# Patient Record
Sex: Female | Born: 1970 | Race: Black or African American | Hispanic: No | Marital: Married | Smoking: Never smoker
Health system: Southern US, Community
[De-identification: ages and names within clinical notes are randomized; demographics above are authoritative.]

## PROBLEM LIST (undated history)

## (undated) ENCOUNTER — Ambulatory Visit: Admission: EM | Payer: BC Managed Care – PPO | Source: Home / Self Care

## (undated) DIAGNOSIS — N915 Oligomenorrhea, unspecified: Secondary | ICD-10-CM

## (undated) DIAGNOSIS — O1490 Unspecified pre-eclampsia, unspecified trimester: Secondary | ICD-10-CM

## (undated) DIAGNOSIS — F53 Postpartum depression: Secondary | ICD-10-CM

## (undated) DIAGNOSIS — L508 Other urticaria: Secondary | ICD-10-CM

## (undated) DIAGNOSIS — R112 Nausea with vomiting, unspecified: Secondary | ICD-10-CM

## (undated) DIAGNOSIS — K591 Functional diarrhea: Secondary | ICD-10-CM

## (undated) DIAGNOSIS — D509 Iron deficiency anemia, unspecified: Secondary | ICD-10-CM

## (undated) DIAGNOSIS — G2581 Restless legs syndrome: Secondary | ICD-10-CM

## (undated) DIAGNOSIS — K219 Gastro-esophageal reflux disease without esophagitis: Secondary | ICD-10-CM

## (undated) DIAGNOSIS — E042 Nontoxic multinodular goiter: Secondary | ICD-10-CM

## (undated) DIAGNOSIS — T7840XA Allergy, unspecified, initial encounter: Secondary | ICD-10-CM

## (undated) DIAGNOSIS — E119 Type 2 diabetes mellitus without complications: Secondary | ICD-10-CM

## (undated) DIAGNOSIS — Z8619 Personal history of other infectious and parasitic diseases: Secondary | ICD-10-CM

## (undated) DIAGNOSIS — I10 Essential (primary) hypertension: Secondary | ICD-10-CM

## (undated) DIAGNOSIS — F419 Anxiety disorder, unspecified: Secondary | ICD-10-CM

## (undated) DIAGNOSIS — F908 Attention-deficit hyperactivity disorder, other type: Secondary | ICD-10-CM

## (undated) DIAGNOSIS — Z9889 Other specified postprocedural states: Secondary | ICD-10-CM

## (undated) DIAGNOSIS — R011 Cardiac murmur, unspecified: Secondary | ICD-10-CM

## (undated) DIAGNOSIS — O99345 Other mental disorders complicating the puerperium: Secondary | ICD-10-CM

## (undated) DIAGNOSIS — M129 Arthropathy, unspecified: Secondary | ICD-10-CM

## (undated) HISTORY — DX: Type 2 diabetes mellitus without complications: E11.9

## (undated) HISTORY — DX: Oligomenorrhea, unspecified: N91.5

## (undated) HISTORY — DX: Unspecified pre-eclampsia, unspecified trimester: O14.90

## (undated) HISTORY — DX: Morbid (severe) obesity due to excess calories: E66.01

## (undated) HISTORY — PX: FRACTURE SURGERY: SHX138

## (undated) HISTORY — DX: Arthropathy, unspecified: M12.9

## (undated) HISTORY — DX: Gastro-esophageal reflux disease without esophagitis: K21.9

## (undated) HISTORY — DX: Personal history of other infectious and parasitic diseases: Z86.19

## (undated) HISTORY — DX: Iron deficiency anemia, unspecified: D50.9

## (undated) HISTORY — DX: Essential (primary) hypertension: I10

## (undated) HISTORY — DX: Postpartum depression: F53.0

## (undated) HISTORY — DX: Other mental disorders complicating the puerperium: O99.345

## (undated) HISTORY — DX: Allergy, unspecified, initial encounter: T78.40XA

## (undated) HISTORY — DX: Nontoxic multinodular goiter: E04.2

---

## 1997-04-06 ENCOUNTER — Encounter: Payer: Self-pay | Admitting: Family Medicine

## 1997-04-06 LAB — CONVERTED CEMR LAB
CO2: 28 meq/L
Chloride: 105 meq/L
Cholesterol: 247 mg/dL
Creatinine, Ser: 0.9 mg/dL
Potassium: 3.9 meq/L
Sodium: 143 meq/L

## 1999-07-18 ENCOUNTER — Encounter: Payer: Self-pay | Admitting: Family Medicine

## 1999-07-18 ENCOUNTER — Other Ambulatory Visit: Admission: RE | Admit: 1999-07-18 | Discharge: 1999-07-18 | Payer: Self-pay | Admitting: Gynecology

## 1999-07-18 LAB — CONVERTED CEMR LAB: Prolactin: 5.9 ng/mL

## 2001-07-16 DIAGNOSIS — Z9884 Bariatric surgery status: Secondary | ICD-10-CM | POA: Insufficient documentation

## 2001-07-16 HISTORY — PX: GASTRIC BYPASS: SHX52

## 2004-07-16 HISTORY — PX: OTHER SURGICAL HISTORY: SHX169

## 2004-09-20 ENCOUNTER — Ambulatory Visit: Payer: Self-pay

## 2005-02-01 ENCOUNTER — Ambulatory Visit: Payer: Self-pay | Admitting: Unknown Physician Specialty

## 2005-03-16 ENCOUNTER — Ambulatory Visit: Payer: Self-pay | Admitting: Unknown Physician Specialty

## 2006-05-20 ENCOUNTER — Observation Stay: Payer: Self-pay

## 2006-05-31 ENCOUNTER — Inpatient Hospital Stay: Payer: Self-pay | Admitting: Obstetrics and Gynecology

## 2007-08-08 ENCOUNTER — Ambulatory Visit: Payer: Self-pay | Admitting: Oncology

## 2007-08-15 LAB — CBC WITH DIFFERENTIAL (CANCER CENTER ONLY)
BASO%: 0.4 % (ref 0.0–2.0)
LYMPH%: 17.7 % (ref 14.0–48.0)
MCH: 20.9 pg — ABNORMAL LOW (ref 26.0–34.0)
MCHC: 32 g/dL (ref 32.0–36.0)
MCV: 65 fL — ABNORMAL LOW (ref 81–101)
MONO%: 6.3 % (ref 0.0–13.0)
Platelets: 453 10*3/uL — ABNORMAL HIGH (ref 145–400)
RDW: 17 % — ABNORMAL HIGH (ref 10.5–14.6)

## 2007-08-20 LAB — RETICULOCYTES (CHCC)
ABS Retic: 42.1 10*3/uL (ref 19.0–186.0)
RBC.: 4.68 MIL/uL (ref 3.87–5.11)
Retic Ct Pct: 0.9 % (ref 0.4–3.1)

## 2007-08-20 LAB — HEMOGLOBINOPATHY EVALUATION: Hgb S Quant: 0 % (ref 0.0–0.0)

## 2007-08-20 LAB — FERRITIN: Ferritin: 6 ng/mL — ABNORMAL LOW (ref 10–291)

## 2007-08-20 LAB — TSH: TSH: 1.451 u[IU]/mL (ref 0.350–5.500)

## 2007-09-12 LAB — CBC WITH DIFFERENTIAL (CANCER CENTER ONLY)
BASO#: 0.1 10*3/uL (ref 0.0–0.2)
Eosinophils Absolute: 0.2 10*3/uL (ref 0.0–0.5)
LYMPH%: 17.1 % (ref 14.0–48.0)
MCH: 20.8 pg — ABNORMAL LOW (ref 26.0–34.0)
MCV: 65 fL — ABNORMAL LOW (ref 81–101)
MONO%: 7 % (ref 0.0–13.0)
Platelets: 438 10*3/uL — ABNORMAL HIGH (ref 145–400)
RBC: 4.5 10*6/uL (ref 3.70–5.32)

## 2007-10-15 ENCOUNTER — Ambulatory Visit: Payer: Self-pay | Admitting: Oncology

## 2007-10-17 LAB — CBC WITH DIFFERENTIAL (CANCER CENTER ONLY)
BASO#: 0.1 10*3/uL (ref 0.0–0.2)
Eosinophils Absolute: 0.2 10*3/uL (ref 0.0–0.5)
HCT: 36.8 % (ref 34.8–46.6)
HGB: 12.1 g/dL (ref 11.6–15.9)
MCH: 24.2 pg — ABNORMAL LOW (ref 26.0–34.0)
MCV: 73 fL — ABNORMAL LOW (ref 81–101)
MONO%: 5.9 % (ref 0.0–13.0)
NEUT#: 7.2 10*3/uL — ABNORMAL HIGH (ref 1.5–6.5)
RBC: 5.01 10*6/uL (ref 3.70–5.32)

## 2007-10-17 LAB — IRON AND TIBC
Iron: 75 ug/dL (ref 42–145)
TIBC: 353 ug/dL (ref 250–470)
UIBC: 278 ug/dL

## 2008-01-19 ENCOUNTER — Ambulatory Visit: Payer: Self-pay | Admitting: Oncology

## 2008-10-16 ENCOUNTER — Observation Stay: Payer: Self-pay

## 2008-11-01 ENCOUNTER — Observation Stay: Payer: Self-pay

## 2008-11-02 ENCOUNTER — Ambulatory Visit: Payer: Self-pay

## 2008-12-15 ENCOUNTER — Observation Stay: Payer: Self-pay

## 2008-12-23 ENCOUNTER — Observation Stay: Payer: Self-pay | Admitting: Obstetrics and Gynecology

## 2008-12-29 ENCOUNTER — Inpatient Hospital Stay: Payer: Self-pay

## 2009-02-15 ENCOUNTER — Ambulatory Visit: Payer: Self-pay | Admitting: Oncology

## 2009-09-26 ENCOUNTER — Encounter: Payer: Self-pay | Admitting: Family Medicine

## 2009-09-26 LAB — CONVERTED CEMR LAB
ALT: 13 units/L
AST: 15 units/L
Albumin: 4 g/dL
CO2: 21 meq/L
Calcium: 9.1 mg/dL
Chloride: 104 meq/L
HDL: 58 mg/dL
LDL Cholesterol: 119 mg/dL
Platelets: 442 10*3/uL
Potassium: 4.2 meq/L
TSH: 1.3 microintl units/mL

## 2009-09-27 ENCOUNTER — Encounter: Payer: Self-pay | Admitting: Family Medicine

## 2010-06-22 ENCOUNTER — Encounter: Payer: Self-pay | Admitting: Family Medicine

## 2010-06-22 ENCOUNTER — Ambulatory Visit: Payer: Self-pay | Admitting: Family Medicine

## 2010-06-22 ENCOUNTER — Telehealth: Payer: Self-pay | Admitting: Family Medicine

## 2010-06-22 DIAGNOSIS — I10 Essential (primary) hypertension: Secondary | ICD-10-CM | POA: Insufficient documentation

## 2010-06-23 ENCOUNTER — Telehealth (INDEPENDENT_AMBULATORY_CARE_PROVIDER_SITE_OTHER): Payer: Self-pay

## 2010-06-23 LAB — CONVERTED CEMR LAB
ALT: 9 units/L (ref 0–35)
Basophils Absolute: 0 10*3/uL (ref 0.0–0.1)
CO2: 26 meq/L (ref 19–32)
Calcium: 8.4 mg/dL (ref 8.4–10.5)
Chloride: 105 meq/L (ref 96–112)
Creatinine, Ser: 0.72 mg/dL (ref 0.40–1.20)
Eosinophils Relative: 2 % (ref 0–5)
Glucose, Bld: 104 mg/dL — ABNORMAL HIGH (ref 70–99)
HCT: 33.8 % — ABNORMAL LOW (ref 36.0–46.0)
Hemoglobin: 10.2 g/dL — ABNORMAL LOW (ref 12.0–15.0)
Lymphocytes Relative: 17 % (ref 12–46)
Lymphs Abs: 1.5 10*3/uL (ref 0.7–4.0)
Monocytes Absolute: 0.8 10*3/uL (ref 0.1–1.0)
Monocytes Relative: 9 % (ref 3–12)
Neutro Abs: 6.4 10*3/uL (ref 1.7–7.7)
RBC: 4.59 M/uL (ref 3.87–5.11)
RDW: 16.3 % — ABNORMAL HIGH (ref 11.5–15.5)
Saturation Ratios: 5 % — ABNORMAL LOW (ref 20–55)
TIBC: 420 ug/dL (ref 250–470)
TSH: 0.601 microintl units/mL (ref 0.350–4.500)
Total Bilirubin: 0.4 mg/dL (ref 0.3–1.2)
Total Protein: 7 g/dL (ref 6.0–8.3)
Vit D, 25-Hydroxy: 14 ng/mL — ABNORMAL LOW (ref 30–89)
Vitamin B-12: 464 pg/mL (ref 211–911)
WBC: 8.9 10*3/uL (ref 4.0–10.5)

## 2010-06-26 ENCOUNTER — Ambulatory Visit: Payer: Self-pay | Admitting: Family Medicine

## 2010-06-29 ENCOUNTER — Encounter: Payer: Self-pay | Admitting: Family Medicine

## 2010-07-03 ENCOUNTER — Ambulatory Visit: Payer: Self-pay | Admitting: Internal Medicine

## 2010-07-03 DIAGNOSIS — K219 Gastro-esophageal reflux disease without esophagitis: Secondary | ICD-10-CM

## 2010-07-03 DIAGNOSIS — D509 Iron deficiency anemia, unspecified: Secondary | ICD-10-CM

## 2010-07-03 DIAGNOSIS — E559 Vitamin D deficiency, unspecified: Secondary | ICD-10-CM

## 2010-07-03 DIAGNOSIS — J309 Allergic rhinitis, unspecified: Secondary | ICD-10-CM | POA: Insufficient documentation

## 2010-07-03 DIAGNOSIS — Z9884 Bariatric surgery status: Secondary | ICD-10-CM | POA: Insufficient documentation

## 2010-07-14 ENCOUNTER — Ambulatory Visit
Admission: RE | Admit: 2010-07-14 | Discharge: 2010-07-14 | Payer: Self-pay | Source: Home / Self Care | Attending: Family Medicine | Admitting: Family Medicine

## 2010-07-14 DIAGNOSIS — M545 Low back pain, unspecified: Secondary | ICD-10-CM | POA: Insufficient documentation

## 2010-07-16 ENCOUNTER — Encounter: Payer: Self-pay | Admitting: Family Medicine

## 2010-07-18 ENCOUNTER — Encounter
Admission: RE | Admit: 2010-07-18 | Discharge: 2010-07-18 | Payer: Self-pay | Source: Home / Self Care | Attending: Family Medicine | Admitting: Family Medicine

## 2010-07-19 ENCOUNTER — Encounter: Payer: Self-pay | Admitting: Family Medicine

## 2010-07-20 ENCOUNTER — Telehealth: Payer: Self-pay | Admitting: Family Medicine

## 2010-07-20 ENCOUNTER — Encounter
Admission: RE | Admit: 2010-07-20 | Discharge: 2010-07-20 | Payer: Self-pay | Source: Home / Self Care | Attending: Family Medicine | Admitting: Family Medicine

## 2010-07-20 LAB — HM MAMMOGRAPHY

## 2010-07-21 ENCOUNTER — Ambulatory Visit
Admission: RE | Admit: 2010-07-21 | Discharge: 2010-07-21 | Payer: Self-pay | Source: Home / Self Care | Attending: Family Medicine | Admitting: Family Medicine

## 2010-07-21 DIAGNOSIS — K1370 Unspecified lesions of oral mucosa: Secondary | ICD-10-CM | POA: Insufficient documentation

## 2010-07-21 DIAGNOSIS — N63 Unspecified lump in unspecified breast: Secondary | ICD-10-CM | POA: Insufficient documentation

## 2010-08-09 ENCOUNTER — Encounter: Payer: Self-pay | Admitting: Family Medicine

## 2010-08-09 ENCOUNTER — Observation Stay: Payer: Self-pay | Admitting: Surgery

## 2010-08-15 NOTE — Assessment & Plan Note (Signed)
Summary: COLD, CONGESTION/EVM   Vital Signs:  Patient Profile:   40 Years Old Female CC:      Cold & URI symptoms Height:     67.5 inches Weight:      206 pounds BMI:     31.90 O2 Sat:      100 % O2 treatment:    Room Air Temp:     97.2 degrees F oral Pulse rate:   80 / minute Pulse rhythm:   regular Resp:     18 per minute BP sitting:   139 / 93  (right arm)  Pt. in pain?   yes    Location:   head    Intensity:   1    Type:       aching  Vitals Entered By: Levonne Spiller EMT-P (June 22, 2010 11:40 AM)              Is Patient Diabetic? No  Does patient need assistance? Functional Status Self care Ambulation Normal      Current Allergies: No known allergies History of Present Illness Chief Complaint: Cold & URI symptoms History of Present Illness: The patient is presenting today for an evaluation of barking cough and sinus drainage and congestion and malaise and fatigue.  Pt syas that she has been having symptoms for the last 2 weeks and not getting better.  Pt coughing up yellow green sputum and having a constant runny nose and headache.  She says that she has been taking Mucinex D and has taken at least 18 tablets and still having symptoms.  She says that she had a gastric bypass and is concerned about anemia.  She has a history of iron deficiency anemia.  She has a desire to check her labs.  She wants to have a CBC and iron studies checked and  a vitamin D level. She says that she is concerned about swelling in the right leg and calf.  She is using a prescription implanted birth control method in her left arm.  She says that she will be established with the [facility] physicians in the next several weeks.  She says that before the gastric bypass she had a history of diabetes, hypertension and many other co-morbidities.    REVIEW OF SYSTEMS Constitutional Symptoms       Complains of weight loss.     Denies fever, chills, night sweats, weight gain, and fatigue.  Eyes         Complains of eye drainage.      Denies change in vision, eye pain, glasses, contact lenses, and eye surgery.      Comments: Watery Eyes Ear/Nose/Throat/Mouth       Complains of frequent runny nose, sinus problems, and hoarseness.      Denies hearing loss/aids, change in hearing, ear pain, ear discharge, dizziness, frequent nose bleeds, and tooth pain or bleeding.  Respiratory       Complains of productive cough.      Denies dry cough, wheezing, shortness of breath, asthma, bronchitis, and emphysema/COPD.      Comments: Colored Sputum Cardiovascular       Denies murmurs, chest pain, and tires easily with exhertion.    Gastrointestinal       Denies stomach pain, nausea/vomiting, diarrhea, constipation, blood in bowel movements, and indigestion.      Comments: IBS Genitourniary       Denies painful urination, kidney stones, and loss of urinary control. Neurological  Complains of headaches.      Denies paralysis, seizures, and fainting/blackouts. Musculoskeletal       Denies muscle pain, joint pain, joint stiffness, decreased range of motion, redness, swelling, muscle weakness, and gout.  Skin       Denies bruising, unusual mles/lumps or sores, and hair/skin or nail changes.  Psych       Denies mood changes, temper/anger issues, anxiety/stress, speech problems, depression, and sleep problems.  Past History:  Past Medical History: PRE-GASTRIC BYPASS Morbid Obesity Hypertension Diabetes Mellitus Weight bearing arthropathy  POST GASTRIC BYPASS Allergic Rhinitis  Past Surgical History: Gastric Bypass - 2003  Family History: Father had a history of heart disease  Social History: Pt is currently in school studying psychology.  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  Never Smoked Alcohol use-no Drug use-no Regular exercise-no Smoking Status:  never Drug Use:  no Does Patient Exercise:  no Physical Exam General appearance: well  developed, well nourished, no acute distress Head: normocephalic, atraumatic Eyes: conjunctivae and lids normal Pupils: equal, round, reactive to light Ears: normal, no lesions or deformities Nasal: pale, boggy, swollen nasal turbinates Oral/Pharynx: tongue normal, posterior pharynx without erythema or exudate Neck: neck supple,  trachea midline, no masses Thyroid: no nodules, masses, tenderness, or enlargement Chest/Lungs: no rales, wheezes, or rhonchi bilateral, breath sounds equal without effort Heart: regular rate and  rhythm, no murmur Abdomen: soft, non-tender without obvious organomegaly Extremities: mild edema of the right leg, no edema seen in left leg Neurological: grossly intact and non-focal Skin: no obvious rashes or lesions MSE: oriented to time, place, and person Assessment Problems:   New Problems: UNSPECIFIED ESSENTIAL HYPERTENSION (ICD-401.9) ACUTE SINUSITIS, UNSPECIFIED (ICD-461.9) EDEMA LEG (ICD-782.3) ? of DEEP VENOUS THROMBOPHLEBITIS, LEG (ICD-453.40)   Patient Education: Patient and/or caregiver instructed in the following: rest, Tylenol prn, exercise, weight loss. The risks, benefits and possible side effects were clearly explained and discussed with the patient.  The patient verbalized clear understanding.  The patient was given instructions to return if symptoms don't improve, worsen or new changes develop.  If it is not during clinic hours and the patient cannot get back to this clinic then the patient was told to seek medical care at an available urgent care or emergency department.  The patient verbalized understanding.   Demonstrates willingness to comply.  Plan New Medications/Changes: DELSYM 30 MG/5ML LQCR (DEXTROMETHORPHAN POLISTIREX) take 1 teaspoon by mouth every 12 hours as needed for severe cough  #80 ML x 0, 06/22/2010, Clanford Johnson MD HYDROCHLOROTHIAZIDE 12.5 MG CAPS (HYDROCHLOROTHIAZIDE) take 1 by mouth daily for blood pressure  #30 x 1,  06/22/2010, Clanford Johnson MD FLUTICASONE PROPIONATE 50 MCG/ACT SUSP (FLUTICASONE PROPIONATE) take 2 sprays per nostril once daily  #1 x 0, 06/22/2010, Clanford Johnson MD AZITHROMYCIN 250 MG TABS (AZITHROMYCIN) take 2 tabs by mouth on day 1, then 1 tab by mouth daily for 4 days  #6 x 0, 06/22/2010, Clanford Johnson MD  Planning Comments:   Recommended that the patient have her blood pressure retested in 3 days.  We will have the patient to start taking blood pressure medications again.  Pt will be started on HCTZ.   The patient will have a medical evaluation in 2 weeks as scheduled at [facility].   Follow Up: Follow up in 2-3 days if no improvement, Follow up on an as needed basis, Follow up with Primary Physician  The patient and/or caregiver has been counseled thoroughly with regard  to medications prescribed including dosage, schedule, interactions, rationale for use, and possible side effects and they verbalize understanding.  Diagnoses and expected course of recovery discussed and will return if not improved as expected or if the condition worsens. Patient and/or caregiver verbalized understanding.  Prescriptions: DELSYM 30 MG/5ML LQCR (DEXTROMETHORPHAN POLISTIREX) take 1 teaspoon by mouth every 12 hours as needed for severe cough  #80 ML x 0   Entered and Authorized by:   Standley Dakins MD   Signed by:   Standley Dakins MD on 06/22/2010   Method used:   Electronically to        [facility] [address] (retail)       [address], [address]       [place], [place]  [address]       Ph: [phone]       Fax: [phone]   RxID:   [id number] HYDROCHLOROTHIAZIDE 12.5 MG CAPS (HYDROCHLOROTHIAZIDE) take 1 by mouth daily for blood pressure  #30 x 1   Entered and Authorized by:   Standley Dakins MD   Signed by:   Standley Dakins MD on 06/22/2010   Method used:   Electronically to        [facility] [address] (retail)       [address],  [address]       [place], [place]  [address]       Ph: [phone]       Fax: [phone]   RxID:   [id number] FLUTICASONE PROPIONATE 50 MCG/ACT SUSP (FLUTICASONE PROPIONATE) take 2 sprays per nostril once daily  #1 x 0   Entered and Authorized by:   Standley Dakins MD   Signed by:   Standley Dakins MD on 06/22/2010   Method used:   Electronically to        [facility] [address] (retail)       [address], [address]       [place], [place]  [address]       Ph: [phone]       Fax: [phone]   RxID:   [id number] AZITHROMYCIN 250 MG TABS (AZITHROMYCIN) take 2 tabs by mouth on day 1, then 1 tab by mouth daily for 4 days  #6 x 0   Entered and Authorized by:   Standley Dakins MD   Signed by:   Standley Dakins MD on 06/22/2010   Method used:   Electronically to        [facility] [address] (retail)       [address], [address]       [place], [place]  [address]       Ph: [phone]       Fax: [phone]   RxID:   [id number]   Patient Instructions: 1)  Take your antibiotic as prescribed until ALL of it is gone, but stop if you develop a rash or swelling and contact our office as soon as possible. 2)  Acute sinusitis symptoms for less than 10 days are not helped by antibiotics.Use warm moist compresses, and over the counter decongestants ( only as directed). Call if no improvement in 5-7 days, sooner if increasing pain, fever, or new symptoms. 3)  Check your Blood Pressure regularly. If it is above: 140/90  you should make an appointment. 4)  Please go to [facility] to have a doppler ultrasound done of your right leg to rule out blood clot.   5)  The patient was informed that there is no on-call provider or services available at this clinic during off-hours (when the clinic is closed).  If the patient developed a problem or concern that required immediate attention, the  patient was advised to go the the nearest available urgent care or emergency department for medical care.  The patient verbalized understanding.      I spent more than 70 minutes with the patient today reviewing her complicated medical history and preparing a treatment plan and arranging for her to go to [facility] for the venous doppler ultrasounds.   Rodney Langton, M.D., F.A.A.F.P.  June 22, 2010

## 2010-08-15 NOTE — Letter (Signed)
Summary: Generic Letter  [facility]  [address]   [place], [place] [address]   Phone: [phone]  Fax: [phone]    06/22/2010  Theresa Pratt [address] [place], [place]  [address]  TO WHOM IT MAY CONCERN:     The patient was seen in our clinic today for medical care.        Sincerely,   Standley Dakins MD

## 2010-08-15 NOTE — Progress Notes (Signed)
  Phone Note Outgoing Call   Call placed by: Dorna Leitz,  June 22, 2010 1:00 PM Call placed to: Patient Action Taken: Appt scheduled Summary of Call: Patient came in for an appointment to see Dr. Laural Benes.  The patient had right leg was swollen so Dr. Laural Benes wanted to schedule the patient for an Venouse doppler (ultrasound) at [facility].  The Dr. Laural Benes ordered the patient to have the venous dopple (ultrasound) today but she was unable to keep the appointment.  We scheduled for 06/23/10 @ 11:00a.m. but the patient will have to be there at 10:30a.m. in Admitting.  I called the patient back and she stated the appointment was better for her on this day. Initial call taken by: Dorna Leitz,  June 22, 2010 1:13 PM

## 2010-08-15 NOTE — Progress Notes (Signed)
  Phone Note Outgoing Call   Call placed by: Levonne Spiller EMT-P,  June 23, 2010 12:44 PM Reason for Call: Discuss lab or test results Summary of Call: Called Theresa Pratt. and left message to contact our office in ref. to lab results and Rx.  Follow-up for Phone Call        Phone call completed. Theresa Pratt was notified of current lab results and orders from Dr. Laural Benes. Also new Rx she needs to pick up and start. Follow-up by: Levonne Spiller EMT-P,  June 23, 2010 1:02 PM    New/Updated Medications: DRISDOL 16010 UNIT CAPS (ERGOCALCIFEROL) 1 cap biw ( Twice Per Week) For 8 weeks. FERROUS SULFATE 325 (65 FE) MG TABS (FERROUS SULFATE) 1 tab by mouth Daily Prescriptions: FERROUS SULFATE 325 (65 FE) MG TABS (FERROUS SULFATE) 1 tab by mouth Daily  #50 x 0   Entered by:   Levonne Spiller EMT-P   Authorized by:   Standley Dakins MD   Signed by:   Levonne Spiller EMT-P on 06/23/2010   Method used:   Electronically to        [facility] [address] ([facility]ail)       [address], [address]       [place], [place]  [address]       Ph: [phone]       Fax: [phone]   RxID:   [id number] DRISDOL 50000 UNIT CAPS (ERGOCALCIFEROL) 1 cap biw ( Twice Per Week) For 8 weeks.  #16 x 0   Entered by:   Levonne Spiller EMT-P   Authorized by:   Standley Dakins MD   Signed by:   Levonne Spiller EMT-P on 06/23/2010   Method used:   Electronically to        [facility] [address] ([facility]ail)       [address], [address]       [place], [place]  [address]       Ph: [phone]       Fax: [phone]   RxID:   [id number]

## 2010-08-15 NOTE — Letter (Signed)
Summary: Generic Letter  [facility]  [address]   [place], [place] [address]   Phone: [phone]  Fax: [phone]    06/22/2010  [facility] [address] [place], [place]  [address]  TO WHOM IT MAY CONCERN:  ORDER FOR VENOUS COLORFLOW DOPPLER OF RIGHT LEG TO RULE OUT DVT  DIAGNOSES:  782.3, 453.40  PLEASE CALL RESULTS TO [phone]  FAX RESULTS TO [phone]     Sincerely,   Standley Dakins MD

## 2010-08-16 HISTORY — PX: CHOLECYSTECTOMY: SHX55

## 2010-08-17 NOTE — Assessment & Plan Note (Signed)
Summary: Wants to discuss mammogram//kad   Vital Signs:  Patient profile:   40 year old female Weight:      208.25 pounds Temp:     97.9 degrees F oral Pulse rate:   68 / minute Pulse rhythm:   regular BP sitting:   122 / 74  (left arm) Cuff size:   large  Vitals Entered By: Selena Batten Dance CMA (AAMA) (July 21, 2010 8:26 AM) CC: DIscuss mammogram   History of Present Illness: CC: abnl mammo/tooth  1. s/p wisdom teeth removed.  went to dentist who found abnormal growth.  He wants pt to go to oral surgeon to get cut out.  No pain, fever.  2. 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  told could check every 6 mo for 2 years to ensure no changes.  if wanted could get needle biopsy or excisional biopsy but radiologist didn't recommend.  pt considering options, would like further eval.  does have some fmhx breast cancer.  3. breast asymmetry - s/p gastric bypass, has lost 100 + lbs in last few years, excess tissue bilaterally.  feels R breast lower than left.    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  s/p iron infusion by hematology (doesn't build stores)  Past Surgical History: Last updated: 07/03/2010 Gastric Bypass (Roux-en-Y) 2003 [[facility]] rectal condyloma removed 2006  Social History: Last updated: 07/03/2010 Never Smoked, occasional EtOH, no drug use caffeine: 10+ cups sweet tea, no coffee, no soda Lives with parents, husband and 2 children, small dog Occupation: Lawyer department head Currently in school getting bachelor's in psychology.  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  Regular exercise-no  Family History: Father - CAD/MI, 2 stents (55yo), smoker, HTN Brother - thyroid issues MGM: DM PGM: cirrhosis, EtOHism Pgreat aunt: BRCA, obesity, smoker, EtOHic Mgreat aunt: BRCA (dec 50s from breast CA)  No other CA, CVA  Review of Systems       per HPI  Physical Exam  General:  Well-developed,well-nourished,in no acute distress; alert,appropriate and cooperative throughout examination Breasts:  No thickening, tenderness, bulging, retraction, inflamation, nipple discharge or skin changes noted.   no mass appreciated at site specified.  no axillary LAD bilaterally   Impression & Recommendations:  Problem # 1:  BREAST MASS, LEFT (ICD-611.72) Assessment New Pt worried about this and wants more definitive therapy.  is leaning towards excisional biopsy despite increased risks of procedure (discussed dimpling of skin, scar, possible lymphatic damage, etc).  also desires to undergo evaluation for breast/skin reduction following gastric bypass.  A total of 25 minutes were spent face-to-face with the patient during this encounter and over half of that time was spent on counseling and coordination of care   Orders: Surgical Referral (Surgery)  Problem # 2:  OTHER&UNSPECIFIED DISEASES THE ORAL SOFT TISSUES (ICD-528.9) Assessment: New reviewed imaging (Pan sent by dentist).  ? calcified growth lateral to left upper 2nd molar where wisdom tooth would otherwise be.  osteoma, osteofibroma?  recommend pt follow up with oral surgery.  Complete Medication List: 1)  Multivitamins Tabs (Multiple vitamin) .... Gluten free one a day 2)  Drisdol 82956 Unit Caps (Ergocalciferol) .Marland Kitchen.. 1 cap biw ( twice  per week) for 8 weeks. 3)  Ferrous Sulfate 325 (65 Fe) Mg Tabs (Ferrous sulfate) .Marland Kitchen.. 1 tab by mouth daily 4)  Midol Max St Menstrual 500-60-15 Mg Tabs (Acetaminophen-caff-pyrilamine) .... As needed 5)  Flexeril 10 Mg Tabs (Cyclobenzaprine hcl) .... 1/2 to 1 tab by  mouth three times a day as needed for pain with sedation caution  Patient Instructions: 1)  Go to see oral surgeon about tooth growth. 2)  We will set you up with surgeon to talk about excisional biopsy and possibility of breast surgery. 3)  God to see you today, call cinic with questions.   Orders Added: 1)  Surgical Referral [Surgery] 2)  Est. Patient Level IV [[address]]    Current Allergies (reviewed today): No known allergies

## 2010-08-17 NOTE — Assessment & Plan Note (Signed)
Summary: PT TO RE-EST/CPX/CLE   Vital Signs:  Patient profile:   40 year old female Height:      67.5 inches Weight:      201.25 pounds Temp:     98.2 degrees F oral Pulse rate:   72 / minute Pulse rhythm:   regular BP sitting:   116 / 70  (left arm) Cuff size:   large  Vitals Entered By: Selena Batten Dance CMA Duncan Dull) (July 03, 2010 9:38 AM) CC: Re-establish care/CPx   History of Present Illness: CC: re establish  cough for 3 wks now, s/p zpack and flonase.  Also given delsym.  Doesn't feel phelgm coming out.  No fevers/chills.  congestion better.  HTN - taking HCTZ daily.  doing well with it.  asks if she could come off.  Weight - lost 12 lbs since 12/2009, took walking class/food diary online.  off sodas.  does drink sweet tea and water.  no coffee.  No milk.    s/p gastric bypass 2003.  postpartum depression - doing well now.  doesn't feel needs help currently.  seen recently at [facility], found to have vit d deficiency, iron def anemia which pt attributes to poor absorption after bypass.  Preventative: Well woman - [facility].  09/2009.  told looking ok.  this year. Received flu shot - 03/2010 Tetanus shot - 02/2009 flex sig - 2006 (per patient done at [facility], will request records)  -  Date:  03/30/2010    Flu vaccine given  Date:  02/27/2009    TD booster given per patient  Current Medications (verified): 1)  Multivitamins  Tabs (Multiple Vitamin) .... Gluten Free One A Day 2)  Hydrochlorothiazide 12.5 Mg Caps (Hydrochlorothiazide) .... Take 1 By Mouth Daily For Blood Pressure 3)  Fluticasone Propionate 50 Mcg/act Susp (Fluticasone Propionate) .... Take 2 Sprays Per Nostril Once Daily 4)  Drisdol 16109 Unit Caps (Ergocalciferol) .Marland Kitchen.. 1 Cap Biw ( Twice Per Week) For 8 Weeks. 5)  Ferrous Sulfate 325 (65 Fe) Mg Tabs (Ferrous Sulfate) .Marland Kitchen.. 1 Tab By Mouth Daily 6)  Cheratussin Ac 100-10 Mg/17ml Syrp (Guaifenesin-Codeine) .... Use One Teaspoon At Bedtime As Needed Cough 7)   Midol Max St Menstrual 500-60-15 Mg Tabs (Acetaminophen-Caff-Pyrilamine) .... 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  s/p iron infusion by hematology (doesn't build stores)  Past Surgical History: Gastric Bypass (Roux-en-Y) 2003 [[facility]] rectal condyloma removed 2006  Family History: Father - CAD/MI, 2 stents (55yo), smoker, HTN Brother - thyroid issues MGM: DM PGM: cirrhosis, EtOHism Mgreat aunt: BRCA  No other CA, CVA  Social History: Never Smoked, occasional EtOH, no drug use caffeine: 10+ cups sweet tea, no coffee, no soda Lives with parents, husband and 2 children, small dog Occupation: Lawyer department head Currently in school getting bachelor's in psychology.  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  Regular exercise-no  Review of Systems  The patient denies anorexia, fever, weight loss, weight gain, vision loss, decreased hearing, hoarseness, chest pain, syncope, dyspnea on exertion, peripheral edema, prolonged cough, headaches, hemoptysis, abdominal pain, melena, hematochezia, severe indigestion/heartburn, hematuria, difficulty walking, depression, and breast masses.         no anxiety.  Physical Exam  General:  well developed, well nourished, no acute  distress, overweight Head:  Normocephalic and atraumatic without obvious abnormalities. No apparent alopecia or balding. Eyes:  PERRLA, EOMI, no injection Ears:  TMs clear bilaterally Nose:  nares clear bilaterally Mouth:  MMM, no pharyngeal erythema/edema Neck:  neck supple,  trachea midline, no masses Lungs:  Normal respiratory effort, chest expands symmetrically. Lungs are clear  to auscultation, no crackles or wheezes. Heart:  Normal rate and regular rhythm. S1 and S2 normal without gallop, murmur, click, rub or other extra sounds. Abdomen:  Bowel sounds positive,abdomen soft and non-tender without masses, organomegaly or hernias noted. Msk:  No deformity or scoliosis noted of thoracic or lumbar spine.   Pulses:  2+ rad pulses Extremities:  no pedal edema Neurologic:  CN grossly intact, station and gait intact Skin:  no obvious rashes or lesions Psych:  full affect, pleasant and cooperative with exam.   Impression & Recommendations:  Problem # 1:  HEALTH MAINTENANCE EXAM (ICD-V70.0) preventative protocols updated.  per patient, had flex sig at GYN office.  will request records.  utd flu, tetanus.  Problem # 2:  OTHER SCREENING MAMMOGRAM (ICD-V76.12) set up with baseline screening mammo Orders: Radiology Referral (Radiology)  Problem # 3:  ANEMIA, IRON DEFICIENCY (ICD-280.9) with some menorrhagia, also s/p bypass per patient with residual absorption issues.  currently on iron dialy.  recheck in 2-3 mo Her updated medication list for this problem includes:    Ferrous Sulfate 325 (65 Fe) Mg Tabs (Ferrous sulfate) .Marland Kitchen... 1 tab by mouth daily  Problem # 4:  BARIATRIC SURGERY STATUS (ICD-V45.86) see above  Problem # 5:  UNSPECIFIED VITAMIN D DEFICIENCY (ICD-268.9) 50k units biweekly x 8 wks then recheck.  Problem # 6:  UNSPECIFIED ESSENTIAL HYPERTENSION (ICD-401.9) good control daily.  Her updated medication list for this problem includes:    Hydrochlorothiazide 12.5 Mg Caps (Hydrochlorothiazide) .Marland Kitchen... Take 1 by mouth daily for blood pressure  BP today: 116/70 Prior BP: 139/93 (06/22/2010)  Labs Reviewed: K+: 4.1 (06/22/2010) Creat: : 0.72 (06/22/2010)   Chol: 247 (04/06/1997)     Complete Medication List: 1)  Multivitamins Tabs (Multiple vitamin) .... Gluten free one a day 2)  Hydrochlorothiazide 12.5 Mg Caps (Hydrochlorothiazide) .... Take 1 by  mouth daily for blood pressure 3)  Fluticasone Propionate 50 Mcg/act Susp (Fluticasone propionate) .... Take 2 sprays per nostril once daily 4)  Drisdol 04540 Unit Caps (Ergocalciferol) .Marland Kitchen.. 1 cap biw ( twice per week) for 8 weeks. 5)  Ferrous Sulfate 325 (65 Fe) Mg Tabs (Ferrous sulfate) .Marland Kitchen.. 1 tab by mouth daily 6)  Cheratussin Ac 100-10 Mg/36ml Syrp (Guaifenesin-codeine) .... Use one teaspoon at bedtime as needed cough 7)  Midol Max St Menstrual 500-60-15 Mg Tabs (Acetaminophen-caff-pyrilamine) .... As needed  Patient Instructions: 1)  For cough cheratussin at night, guaifenesin IR 400mg  1 pill in am and at noon with plenty of fluid to mobilize mucous. 2)  Please return in 3 months for followup, come in prior fasting for blood work [FLP, CMP, CBC, iron panel, vit D 401.9, 280.9, V45.86, v70.0, 278.02]. 3)  Release of records from [facility] (recent blood work). 4)  schedule mammogram - [facility]. 5)  Good to meet you today, call clinic with questions. Prescriptions: CHERATUSSIN AC 100-10 MG/5ML SYRP (GUAIFENESIN-CODEINE) use one teaspoon at bedtime as needed cough  #100cc x 0   Entered and Authorized by:   Eustaquio Boyden  MD   Signed by:   Eustaquio Boyden  MD on 07/03/2010   Method used:  Print then Give to Patient   RxID:   [id number]    Orders Added: 1)  Radiology Referral [Radiology] 2)  New Patient 40-64 years [99386]    Current Allergies (reviewed today): No known allergies   Appended Document: PT TO RE-EST/CPX/CLE checked with [facility] - no flex sig done there.

## 2010-08-17 NOTE — Progress Notes (Signed)
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  Her dentist wants to send you her x-rays via email.  Do you want to give out your address?  If not she said she can have her email them to her, she will have to pull them off the internet when she comes in tomorrow.  Please advise. Initial call taken by: Lowella Petties CMA, AAMA,  July 20, 2010 3:42 PM  Follow-up for Phone Call        fine to send me email.  [email or web address] Follow-up by: Eustaquio Boyden  MD,  July 20, 2010 4:51 PM  Additional Follow-up for Phone Call Additional follow up Details #1::        Address given to [facility] to give to pt when she gets here. Additional Follow-up by: Lowella Petties CMA, AAMA,  July 21, 2010 8:07 AM

## 2010-08-17 NOTE — Miscellaneous (Signed)
Summary: Past labs  Clinical Lists Changes  Observations: Added new observation of PROLACTIN: 5.9 ng/mL (07/18/1999 14:55) Added new observation of TSH: 2.276 microintl units/mL (07/18/1999 14:55) Added new observation of BG RANDOM: 103 mg/dL (16/04/9603 54:09) Added new observation of CREATININE: 0.9 mg/dL (81/19/1478 29:56) Added new observation of BUN: 10 mg/dL (21/30/8657 84:69) Added new observation of BG RANDOM: 120 mg/dL (62/95/2841 32:44) Added new observation of CO2 PLSM/SER: 28 meq/L (04/06/1997 14:55) Added new observation of CL SERUM: 105 meq/L (04/06/1997 14:55) Added new observation of K SERUM: 3.9 meq/L (04/06/1997 14:55) Added new observation of NA: 143 meq/L (04/06/1997 14:55) Added new observation of CHOLESTEROL: 247 mg/dL (07/18/7251 66:44)  Appended Document: Past labs    Clinical Lists Changes  Observations: Added new observation of PAST MED HX: PRE-GASTRIC BYPASS Morbid Obesity Hypertension Diabetes Mellitus Weight bearing arthropathy Oligomenorrhea  POST GASTRIC BYPASS Allergic Rhinitis  (07/03/2010 8:13) Added new observation of FAMILY HX: Father had a history of heart disease Brother - thyroid issues MGM: DM Mgreat aunt: BRCA  No other CA (07/03/2010 8:13) Added new observation of PAP SMEAR: normal (10/26/1996 8:14)        -  Date:  10/26/1996    PAP normal   Past History:  Past Medical History: PRE-GASTRIC BYPASS Morbid Obesity Hypertension Diabetes Mellitus Weight bearing arthropathy Oligomenorrhea  POST GASTRIC BYPASS Allergic Rhinitis   Family History: Father had a history of heart disease Brother - thyroid issues MGM: DM Mgreat aunt: BRCA  No other CA

## 2010-08-17 NOTE — Assessment & Plan Note (Signed)
Summary: PULED MUSCLE IN BACK/PAIN/JRR   Vital Signs:  Patient profile:   40 year old female Height:      67.5 inches Weight:      208.25 pounds BMI:     32.25 Temp:     98.2 degrees F oral Pulse rate:   88 / minute Pulse rhythm:   regular BP sitting:   112 / 84  (left arm) Cuff size:   large  Vitals Entered By: Delilah Shan CMA Duncan Dull) (July 14, 2010 2:30 PM) CC: Pulled Muscle in back / pain   History of Present Illness: Bent over and was getting a pot out the cabinet.  Felt a pull in lower back.  Now with pain in L lower back, not in midline.  Trouble getting out of a chair due to pain.  Took a midol and this didn't cover the pain. No pain in legs, no radicular symptoms.  No loss of bowel control.  No FCNAV.  Still able to walk but with pain.  No weakness.   Allergies: No Known Drug Allergies  Review of Systems       See HPI.  Otherwise negative.    Physical Exam  General:  no apparent distress regular rate and rhythm clear to auscultation bilaterally back w/o midline pain bilateral L spine paraspinal tenderness SLR neg and distally nv intact.    Impression & Recommendations:  Problem # 1:  BACK PAIN, LUMBAR (ICD-724.2)  no indication for imaging.  Likely benign muscle strain and supportive tx in meantime.  Fu as needed, she agrees.  Plan d/w patient.  Her updated medication list for this problem includes:    Midol Max St Menstrual 500-60-15 Mg Tabs (Acetaminophen-caff-pyrilamine) .Marland Kitchen... As needed    Flexeril 10 Mg Tabs (Cyclobenzaprine hcl) .Marland Kitchen... 1/2 to 1 tab by mouth three times a day as needed for pain with sedation caution  Orders: Prescription Created Electronically [phone])  Complete Medication List: 1)  Multivitamins Tabs (Multiple vitamin) .... Gluten free one a day 2)  Hydrochlorothiazide 12.5 Mg Caps (Hydrochlorothiazide) .... Take 1 by mouth daily for blood pressure 3)  Drisdol 65784 Unit Caps (Ergocalciferol) .Marland Kitchen.. 1 cap biw ( twice per week) for 8  weeks. 4)  Ferrous Sulfate 325 (65 Fe) Mg Tabs (Ferrous sulfate) .Marland Kitchen.. 1 tab by mouth daily 5)  Cheratussin Ac 100-10 Mg/20ml Syrp (Guaifenesin-codeine) .... Use one teaspoon at bedtime as needed cough 6)  Midol Max St Menstrual 500-60-15 Mg Tabs (Acetaminophen-caff-pyrilamine) .... As needed 7)  Flexeril 10 Mg Tabs (Cyclobenzaprine hcl) .... 1/2 to 1 tab by mouth three times a day as needed for pain with sedation caution  Patient Instructions: 1)  I would take the flexeril as needed.  It can make you drowsy.  If the midol doesn't help, I would take 2 regular strength tylenol tabs three times a day for pain.  Use a pillow to support your back while you sit and try to walk some over the weekend.  Let us know if you aren't improving.  Prescriptions: FLEXERIL 10 MG TABS (CYCLOBENZAPRINE HCL) 1/2 to 1 tab by mouth three times a day as needed for pain with sedation caution  #30 x 1   Entered and Authorized by:   Crawford Givens MD   Signed by:   Crawford Givens MD on 07/14/2010   Method used:   Electronically to        [facility]  [address] [phone]* (retail)       [address]  [place], [place]  [address]       Ph: [phone] or [phone]       Fax: [phone]   RxID:   [id number]    Orders Added: 1)  Prescription Created Electronically [G8553] 2)  Est. Patient Level III [59563]    Current Allergies (reviewed today): No known allergies

## 2010-08-23 NOTE — Letter (Signed)
Summary: [facility]   Imported By: Kassie Mends 08/18/2010 11:21:45  _____________________________________________________________________  External Attachment:    Type:   Image     Comment:   External Document  Appended Document: [facility] admitted [facility] possible cholecystitis/ectomy.

## 2010-09-25 ENCOUNTER — Other Ambulatory Visit: Payer: Self-pay

## 2010-09-28 ENCOUNTER — Ambulatory Visit: Payer: Self-pay | Admitting: Family Medicine

## 2010-09-28 DIAGNOSIS — Z0289 Encounter for other administrative examinations: Secondary | ICD-10-CM

## 2010-10-11 ENCOUNTER — Encounter: Payer: Self-pay | Admitting: Family Medicine

## 2010-12-01 ENCOUNTER — Encounter: Payer: Self-pay | Admitting: Family Medicine

## 2010-12-01 ENCOUNTER — Ambulatory Visit (INDEPENDENT_AMBULATORY_CARE_PROVIDER_SITE_OTHER): Payer: BC Managed Care – PPO | Admitting: Family Medicine

## 2010-12-01 VITALS — BP 128/86 | HR 80 | Temp 97.9°F | Ht 67.5 in | Wt 203.0 lb

## 2010-12-01 DIAGNOSIS — R21 Rash and other nonspecific skin eruption: Secondary | ICD-10-CM | POA: Insufficient documentation

## 2010-12-01 MED ORDER — VALACYCLOVIR HCL 1 G PO TABS: 1000.0000 mg | ORAL_TABLET | Freq: Three times a day (TID) | ORAL | Status: AC

## 2010-12-01 MED ORDER — HYDROCODONE-ACETAMINOPHEN 5-500 MG PO TABS: 1.0000 | ORAL_TABLET | Freq: Four times a day (QID) | ORAL | Status: AC | PRN

## 2010-12-01 NOTE — Progress Notes (Signed)
  Subjective:    Patient ID: Theresa Pratt, female    DOB: 1970-11-30, 40 y.o.   MRN: [id number]  HPI CC: sore bumps?  2d h/o rash R side under arm.  Started with ache and tingle.  Then noticed rash.  Under R arm, as well as posterior back.  Had leftover medicine from previous prescription given for possible shingles, also took oxycodone for pain.  Really uncomfortable.  Also using after-bite cream.  No one else at home with similar bites or rashes.  No fevers/chills, n/v, oral lesions.  No new lotions, detergents, shampoos.  H/o chicken pox.  Since last time I saw her, had urgent cholecystectomy/gallstones in common bile duct, had ERCP done 08/2010 at [facility]. Wt Readings from Last 3 Encounters:  12/01/10 203 lb (92.08 kg)  07/21/10 208 lb 4 oz (94.462 kg)  07/14/10 208 lb 4 oz (94.462 kg)    As far as mammogram, hasn't returned for rpt, due in 01/2011.  Review of Systems Per HPI    Objective:   Physical Exam  [nursing notereviewed. Constitutional: She appears well-developed and well-nourished. No distress.  Abdominal: Bowel sounds are normal.  Skin: Skin is warm and dry.          R axilla with erythematous papules x 4-5, achey, pruritic R back with erythematous achey coalesced papules No other rash Entire rash along T3 distribution on right          Assessment & Plan:

## 2010-12-01 NOTE — Assessment & Plan Note (Signed)
Currently nonvesicular however early in course.  All of rash along T3 dermatomal distribution. Could be shingles vs bug bites.  rec wash all bedding.  Warm compresses as well. Pt has already started taking valtrex, rec continue full course. vicodin for pain and benadryl for itch. Update [place] if not improving as expected. Discussed possible PHN.

## 2010-12-01 NOTE — Patient Instructions (Signed)
Could be shingles, could be bug bites. Treat with warm compresses, finish valcyclovir (valtrex) course (usual dosing is 1gram three times a day for 7 days). vicodin for pain Benadryl OTC for itching. Update [place] if not improving as expected.

## 2011-01-02 ENCOUNTER — Ambulatory Visit (INDEPENDENT_AMBULATORY_CARE_PROVIDER_SITE_OTHER): Payer: BC Managed Care – PPO | Admitting: Family Medicine

## 2011-01-02 ENCOUNTER — Encounter: Payer: Self-pay | Admitting: Family Medicine

## 2011-01-02 VITALS — BP 110/80 | HR 71 | Temp 97.8°F | Ht 67.5 in | Wt 201.0 lb

## 2011-01-02 DIAGNOSIS — R21 Rash and other nonspecific skin eruption: Secondary | ICD-10-CM

## 2011-01-02 MED ORDER — AMITRIPTYLINE HCL 25 MG PO TABS: 25.0000 mg | ORAL_TABLET | Freq: Every day | ORAL | Status: DC

## 2011-01-02 NOTE — Patient Instructions (Signed)
I would take the amitriptyline at night for the pain and use an over the counter antifungal on your leg (use it for a week after the rash resolves on your leg).  Take care.  Call with questions.

## 2011-01-02 NOTE — Progress Notes (Signed)
Rash.  Round lesions on leg, better with clorox but it returned.  Doesn't itch. Not red.    H/o shingles on L shoulder.  It's still sore and there is a lesion still in the area.  The burning is better but not resolved.  No new rash in the area.   Meds, vitals, and allergies reviewed.   ROS: See HPI.  Otherwise, noncontributory.  nad ncat Neck supple Skin: L thigh with ~2cm lesion typical for a ringworm.   R shoulder with sensitivity in dermatome with prev shingles.  No acute skin changes.  She has some persistent thickening of the skin where she reportedly had scabbing prev.

## 2011-01-02 NOTE — Assessment & Plan Note (Addendum)
Shingles prev with post herpetic neuralgia and also ringworm.  D/w pt about path/phys of shingles and will start low dose amitriptyline for PHN with sedation caution.  She understood.  I would use otc antifungal for ringworm until resolved and then 1 more week.  She agreed, f/u or call back prn.

## 2011-01-03 ENCOUNTER — Encounter: Payer: Self-pay | Admitting: Family Medicine

## 2011-02-02 ENCOUNTER — Other Ambulatory Visit: Payer: Self-pay | Admitting: Family Medicine

## 2011-02-18 IMAGING — US ABDOMEN ULTRASOUND
1 series · 17 of 25 positions shown · non-contrast
Comparison: none

REASON FOR EXAM: RUQ pain elevated LFTs.
COMMENTS:

[Series 1: abdomen ultrasound · 17 of 112 slices shown]
[im 1/112]
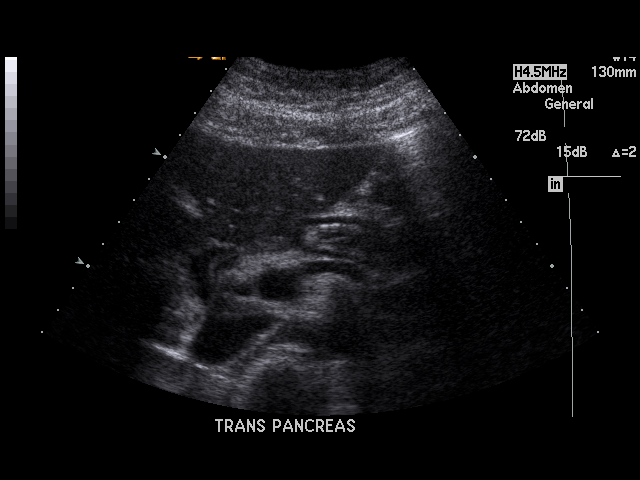
[im 10/112]
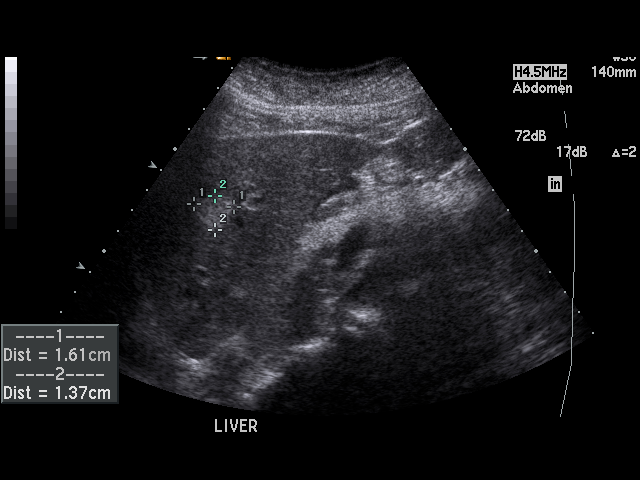
[im 14/112]
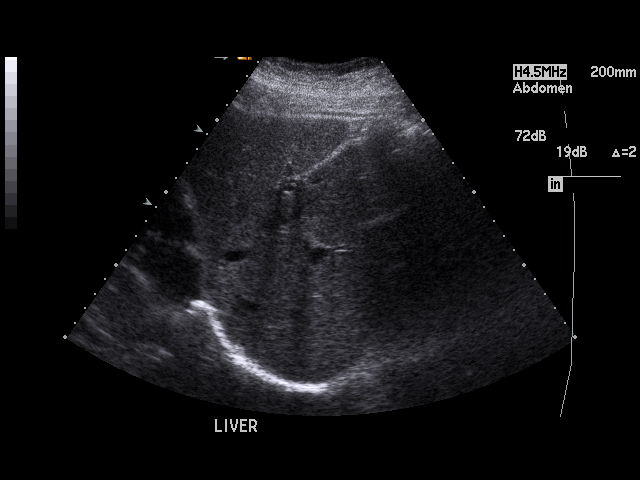
[im 24/112]
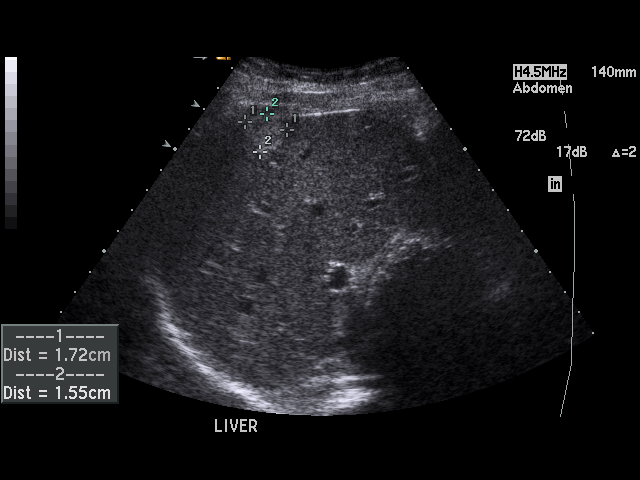
[im 28/112]
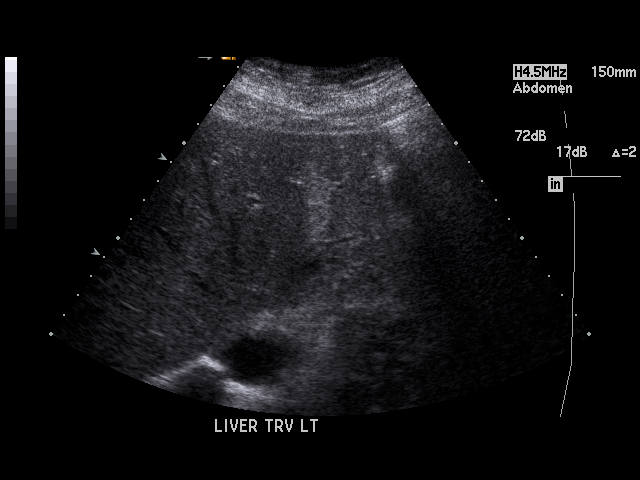
[im 38/112]
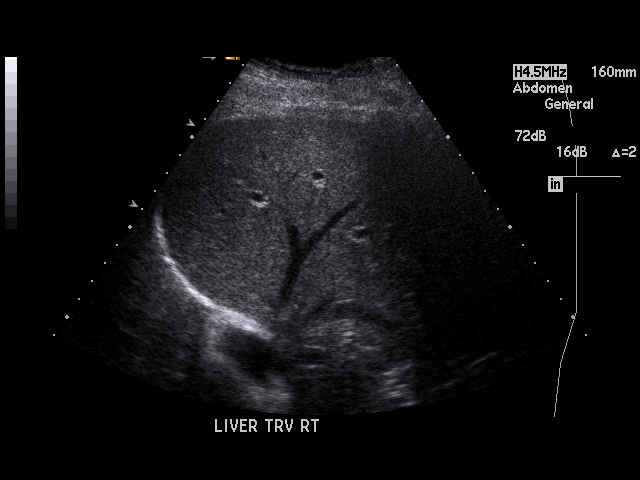
[im 42/112]
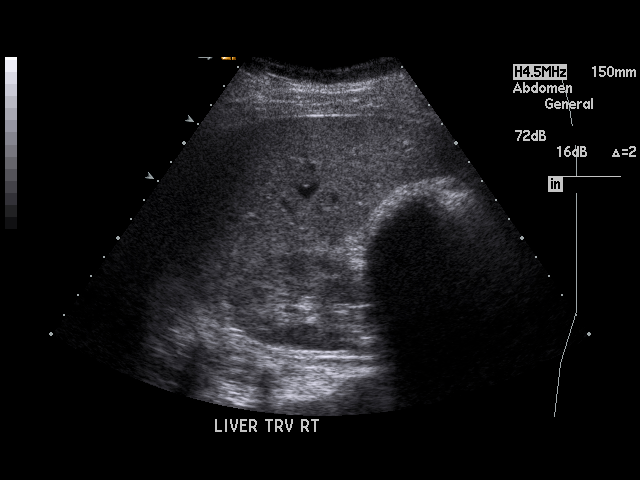
[im 51/112]
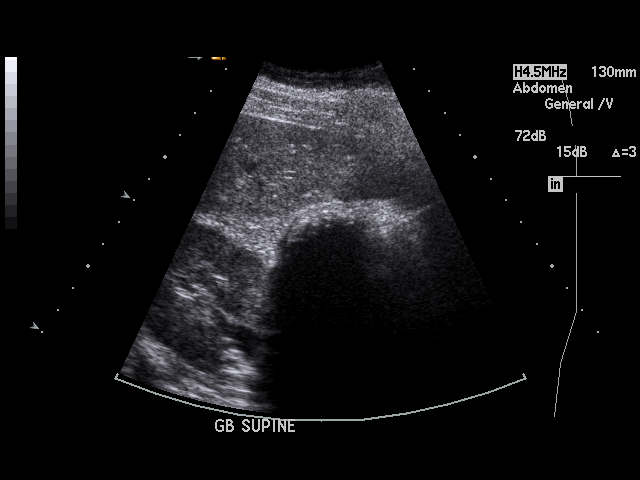
[im 56/112]
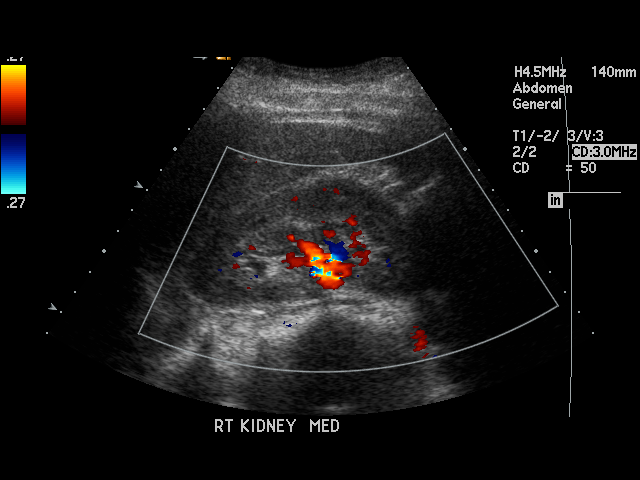
[im 61/112]
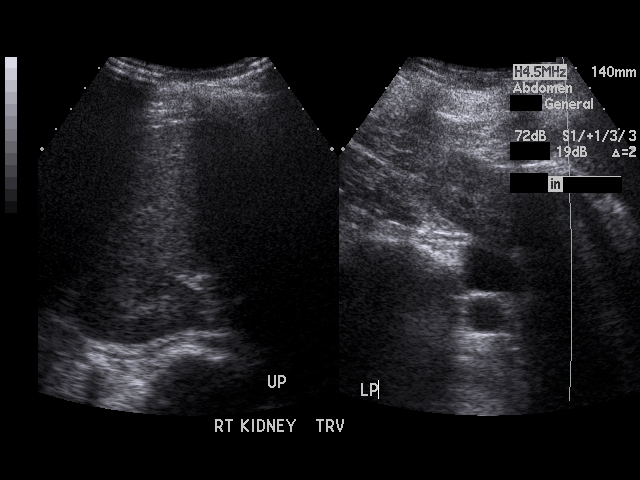
[im 70/112]
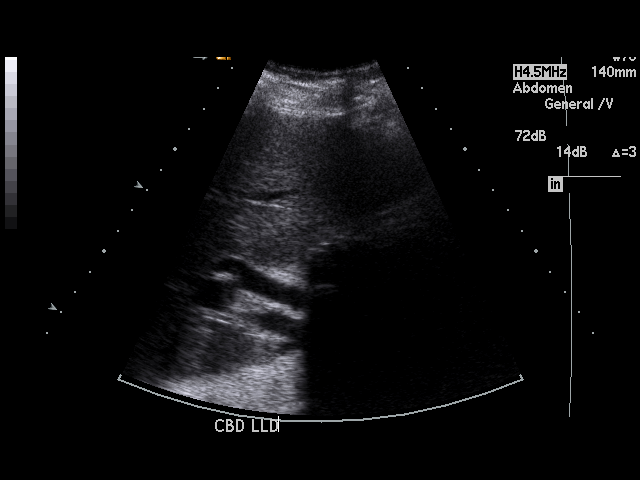
[im 75/112]
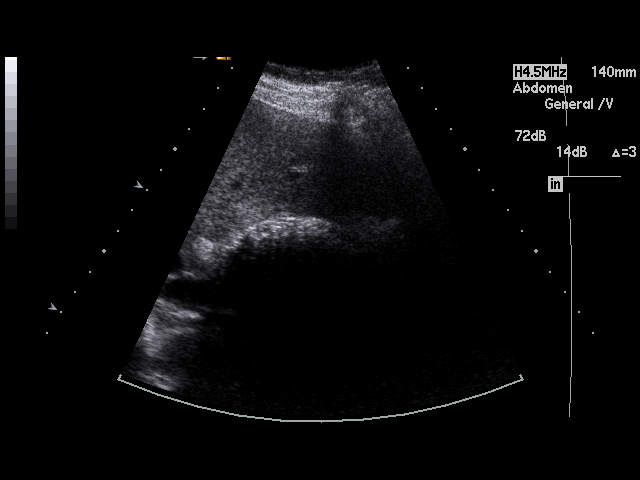
[im 84/112]
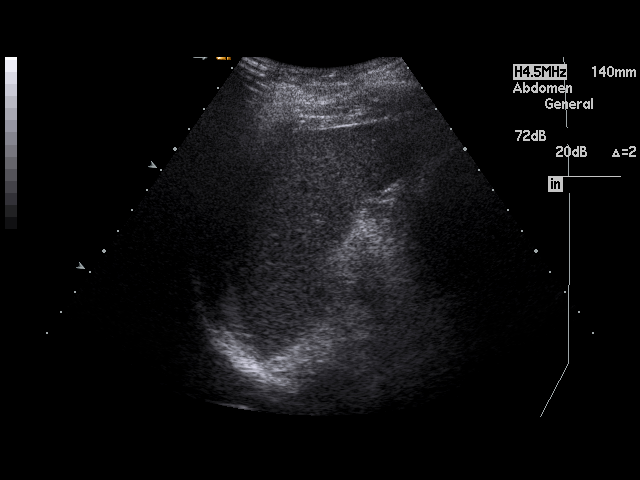
[im 88/112]
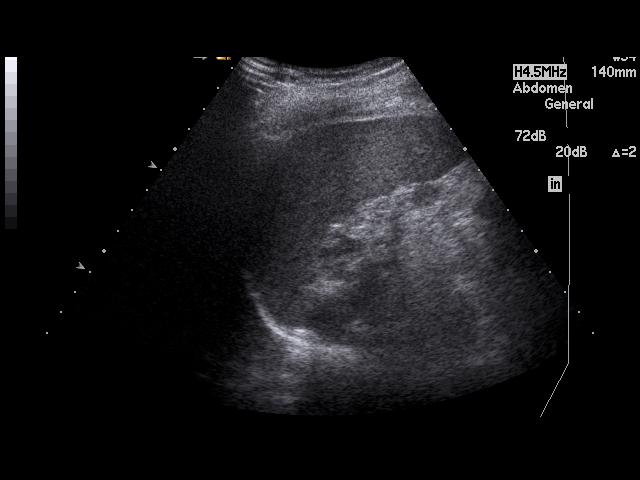
[im 98/112]
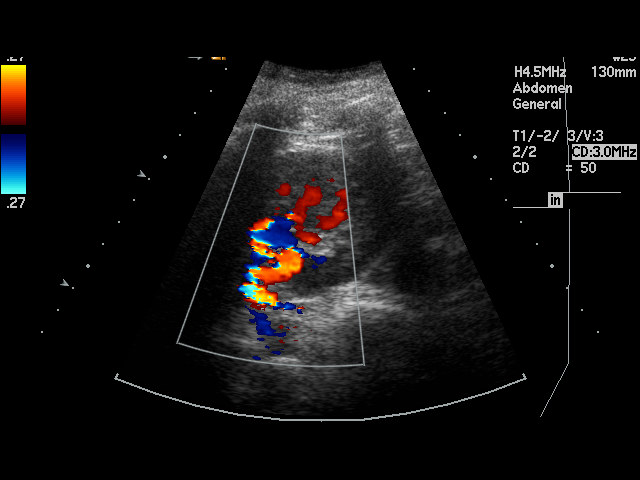
[im 102/112]
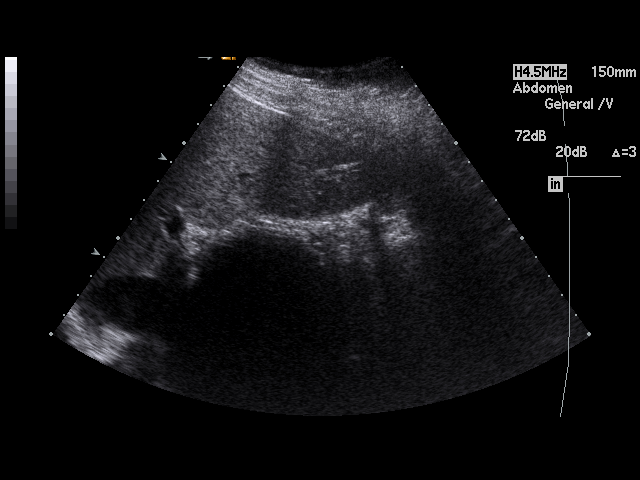
[im 112/112]
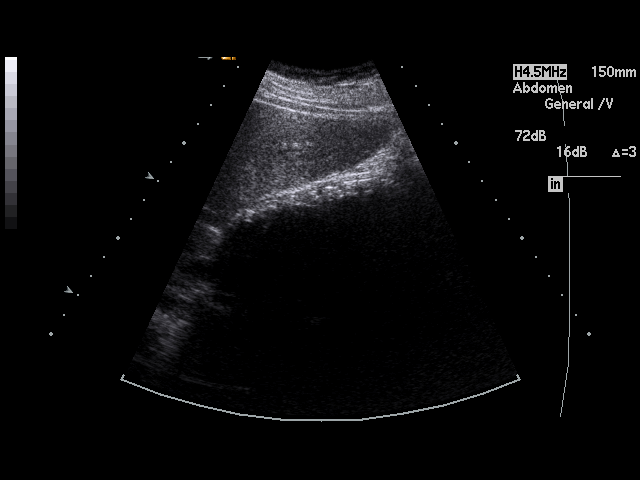

[17 of 25 positions shown; findings below may reference images not displayed]

PROCEDURE:     US  - US ABDOMEN GENERAL SURVEY  - August 09, 2010  [DATE]

RESULT:     There are three, hyperechoic foci in the liver. The largest is
in the right lobe and measures 1.85 cm at maximum diameter. Multiple hepatic
hemangiomas would be the primary consideration as to etiology in a patient
with no known primary. Further evaluation by CT or MR is suggested or
alternatively follow-up by ultrasound to document stability is recommended.
A portion of the pancreatic tail is obscured by bowel gas. The pancreas
otherwise appears normal. The inferior vena cava and abdominal aorta show no
significant abnormalities. The gallbladder is contracted. It is uncertain as
to whether this is in part secondary to the patient having eaten prior to
the exam or if the gallbladder is chronically contracted. There is a band of
echodensities in the gallbladder consistent with multiple gallstones in a
contracted gallbladder. The gallbladder wall measures 2.8 mm in thickness
which is within the limits of normal. The common bile duct measures 8.3 mm
in diameter which is prominent for the patient's age. Further evaluation by
Biliary Nuclide Scan or hepatic MR is suggested. The kidneys show no
hydronephrosis. Sagittally, the right kidney measures 11 cm and the left
measures 11.06 cm. There is no ascites.
IMPRESSION: 1.  Cholelithiasis.
2.  The gallbladder is contracted.
3.  The common bile duct measures 8.3 mm in diameter which is mildly
prominent for the patient's age. The common duct could be further evaluated
by Biliary Nuclide Scan or hepatic MR.
4.  There are multiple hyperechoic masses in the liver as noted above.
Hepatic hemangiomas would be the first consideration as to etiology.

## 2011-03-31 ENCOUNTER — Other Ambulatory Visit: Payer: Self-pay | Admitting: Family Medicine

## 2011-04-02 ENCOUNTER — Other Ambulatory Visit: Payer: Self-pay | Admitting: *Deleted

## 2011-04-02 NOTE — Telephone Encounter (Signed)
Okay to refill? 

## 2011-04-03 MED ORDER — AMITRIPTYLINE HCL 25 MG PO TABS: 25.0000 mg | ORAL_TABLET | Freq: Every day | ORAL | Status: DC

## 2011-04-03 NOTE — Telephone Encounter (Signed)
Which med?

## 2011-04-03 NOTE — Telephone Encounter (Signed)
It was for Amitriptyline.  Sorry.  I think it has already been done now.

## 2011-08-13 ENCOUNTER — Other Ambulatory Visit: Payer: Self-pay | Admitting: Family Medicine

## 2011-08-13 DIAGNOSIS — N63 Unspecified lump in unspecified breast: Secondary | ICD-10-CM

## 2011-08-16 ENCOUNTER — Encounter: Payer: BC Managed Care – PPO | Admitting: Family Medicine

## 2011-08-23 ENCOUNTER — Other Ambulatory Visit: Payer: BC Managed Care – PPO

## 2011-10-29 ENCOUNTER — Ambulatory Visit: Payer: BC Managed Care – PPO | Admitting: Family Medicine

## 2011-10-29 DIAGNOSIS — Z01419 Encounter for gynecological examination (general) (routine) without abnormal findings: Secondary | ICD-10-CM

## 2011-12-14 ENCOUNTER — Encounter: Payer: Self-pay | Admitting: Family Medicine

## 2011-12-14 ENCOUNTER — Ambulatory Visit (INDEPENDENT_AMBULATORY_CARE_PROVIDER_SITE_OTHER): Payer: BC Managed Care – PPO | Admitting: Family Medicine

## 2011-12-14 VITALS — BP 118/76 | HR 96 | Temp 98.3°F | Wt 219.8 lb

## 2011-12-14 DIAGNOSIS — B354 Tinea corporis: Secondary | ICD-10-CM | POA: Insufficient documentation

## 2011-12-14 LAB — POCT SKIN KOH: Skin KOH, POC: POSITIVE

## 2011-12-14 MED ORDER — AMITRIPTYLINE HCL 10 MG PO TABS: 10.0000 mg | ORAL_TABLET | Freq: Every day | ORAL | Status: DC

## 2011-12-14 MED ORDER — CLOTRIMAZOLE 1 % EX CREA: TOPICAL_CREAM | Freq: Two times a day (BID) | CUTANEOUS | Status: DC

## 2011-12-14 NOTE — Progress Notes (Signed)
  Subjective:    Patient ID: Theresa Pratt, female    DOB: Dec 09, 1970, 41 y.o.   MRN: [id number]  HPI CC: skin rash  Right posterior shoulder with rash noted this week.  Mildly itchy.  Nontender.    No dogs at home.  No other family with skin rash.  Previously has treated with calomine lotion, iodine, and clorox.  No new lotions, detergents ,soaps, shampoos, foods, medicines.  Bathes in capful of clorox and some epsom salt.  Does this a few time a week.  Review of Systems Per HPI    Objective:   Physical Exam  Nursing note and vitals reviewed. Constitutional: She appears well-developed and well-nourished. No distress.  Skin: Skin is warm and dry. Rash noted.       Small circular lesion on right posterior shoulder. Slightly curled/raised edge, erythematous, scaly. Scraping performed       Assessment & Plan:

## 2011-12-14 NOTE — Assessment & Plan Note (Signed)
KOH positive. Treat with clotrimazole . update if not improved with treatment.

## 2011-12-14 NOTE — Patient Instructions (Signed)
Ringworm - treat with clotrimazole cream twice daily for 2-4 weeks (use 1 week after rash gone). Good to see you today, call us with questions.  Ringworm, Body [Tinea Corporis] Ringworm is a fungal infection of the skin and hair. Another name for this problem is Tinea Corporis. It has nothing to do with worms. A fungus is an organism that lives on dead cells (the outer layer of skin). It can involve the entire body. It can spread from infected pets. Tinea corporis can be a problem in wrestlers who may get the infection form other players/opponents, equipment and mats. DIAGNOSIS  A skin scraping can be obtained from the affected area and by looking for fungus under the microscope. This is called a KOH examination.  HOME CARE INSTRUCTIONS   Ringworm may be treated with a topical antifungal cream, ointment, or oral medications.   If you are using a cream or ointment, wash infected skin. Dry it completely before application.   Scrub the skin with a buff puff or abrasive sponge using a shampoo with ketoconazole to remove dead skin and help treat the ringworm.   Have your pet treated by your veterinarian if it has the same infection.  SEEK MEDICAL CARE IF:   Your ringworm patch (fungus) continues to spread after 7 days of treatment.   Your rash is not gone in 4 weeks. Fungal infections are slow to respond to treatment. Some redness (erythema) may remain for several weeks after the fungus is gone.   The area becomes red, warm, tender, and swollen beyond the patch. This may be a secondary bacterial (germ) infection.   You have a fever.  Document Released: 06/29/2000 Document Revised: 06/21/2011 Document Reviewed: 12/10/2008 [facility] Patient Information 2012 [place], [place].

## 2011-12-18 ENCOUNTER — Other Ambulatory Visit: Payer: Self-pay | Admitting: Family Medicine

## 2011-12-18 DIAGNOSIS — E559 Vitamin D deficiency, unspecified: Secondary | ICD-10-CM

## 2011-12-18 DIAGNOSIS — I10 Essential (primary) hypertension: Secondary | ICD-10-CM

## 2011-12-18 DIAGNOSIS — D509 Iron deficiency anemia, unspecified: Secondary | ICD-10-CM

## 2011-12-28 ENCOUNTER — Other Ambulatory Visit: Payer: BC Managed Care – PPO

## 2012-01-04 ENCOUNTER — Encounter: Payer: BC Managed Care – PPO | Admitting: Family Medicine

## 2012-01-04 DIAGNOSIS — Z0289 Encounter for other administrative examinations: Secondary | ICD-10-CM

## 2012-04-29 ENCOUNTER — Encounter: Payer: Self-pay | Admitting: *Deleted

## 2012-04-29 ENCOUNTER — Ambulatory Visit (INDEPENDENT_AMBULATORY_CARE_PROVIDER_SITE_OTHER): Payer: BC Managed Care – PPO | Admitting: Family Medicine

## 2012-04-29 ENCOUNTER — Encounter: Payer: Self-pay | Admitting: Family Medicine

## 2012-04-29 VITALS — BP 132/84 | HR 84 | Temp 98.1°F | Wt 218.5 lb

## 2012-04-29 DIAGNOSIS — T148XXA Other injury of unspecified body region, initial encounter: Secondary | ICD-10-CM

## 2012-04-29 MED ORDER — NAPROXEN 500 MG PO TABS: ORAL_TABLET | ORAL | Status: DC

## 2012-04-29 MED ORDER — AMITRIPTYLINE HCL 10 MG PO TABS: 10.0000 mg | ORAL_TABLET | Freq: Every day | ORAL | Status: AC

## 2012-04-29 MED ORDER — CYCLOBENZAPRINE HCL 10 MG PO TABS: 10.0000 mg | ORAL_TABLET | Freq: Two times a day (BID) | ORAL | Status: AC | PRN

## 2012-04-29 NOTE — Assessment & Plan Note (Signed)
Of right chest wall, neck, R shoulder and lower back. Exam benign. Treat with NSAIDs, heating pad, and flexeril. Update if not improving as expected.  Discussed anticipated course of resolution. Out of work for 2 days.
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
## 2012-04-29 NOTE — Patient Instructions (Addendum)
I think you do have muscle strain - shoulder and neck as well as lower back. Treat with naprosyn twice daily for 3-5 days with food then as needed. Flexeril as muscle relaxant - may make you sleepy. Out of work for next 2 days - letter will be mailed.  Let us know if not improving by trip due next week. Good to see you today, let us know if not improving as expected.

## 2012-04-30 ENCOUNTER — Telehealth: Payer: Self-pay | Admitting: Family Medicine

## 2012-04-30 NOTE — Telephone Encounter (Signed)
Pt needs work note stating she is out of work and can return on Friday. She also needs a copy of the patient instructions because the computers were down when she was leaving.

## 2012-04-30 NOTE — Telephone Encounter (Signed)
Both were mailed to patient this morning. Patient advised of same.

## 2012-05-02 ENCOUNTER — Telehealth: Payer: Self-pay

## 2012-05-02 NOTE — Telephone Encounter (Signed)
Pt left v/m requesting letter for limited travel due to pain. Pt is going to work on Mon but is cancelling 4 hour drive work trip. Left message for pt to call back for update on status of pain.

## 2012-05-02 NOTE — Telephone Encounter (Signed)
Patient notified and letter placed up front for pick up. 

## 2012-05-02 NOTE — Telephone Encounter (Signed)
Letter printed and placed in Theresa Pratt's box. 

## 2012-05-02 NOTE — Telephone Encounter (Signed)
Pt said muscle relaxant is working but she drove to [place] yesterday with 1 1/2 round trip and pt had spasms on both side of back which went down into legs. Pt said driving for any period of time aggravates symptoms. Pt said the work trip is 4 hrs one way so would actually be an 8 hour total trip. Call pt when letter is ready for pick up.

## 2012-06-03 ENCOUNTER — Telehealth: Payer: Self-pay | Admitting: Family Medicine

## 2012-06-03 NOTE — Telephone Encounter (Signed)
Patient Information:  Caller Name: Scott  Phone: [phone]  Patient: Theresa Pratt, Theresa Pratt  Gender: Female  DOB: 07-08-1971  Age: 41 Years  PCP: Eust[facility])  Pregnant: No   Symptoms  Reason For Call & Symptoms: Pt is calling for an appt today. She has a rash that has been diagnosed as scabies this w/e at an [facility]. Pt wants an appt for a physical.  Reviewed Health History In EMR: Yes  Reviewed Medications In EMR: Yes  Reviewed Allergies In EMR: Yes  Date of Onset of Symptoms: 04/29/2012  Treatments Tried: muscles relaxants. Pt is taking the cream for the scabies  Treatments Tried Worked: No OB:  LMP: 05/19/2012  Guideline(s) Used:  Back Pain  Disposition Per Guideline:   See Within 2 Weeks in Office  Reason For Disposition Reached:   Back pain persists > 2 weeks  Advice Given:  N/A  Office Follow Up:  Does the office need to follow up with this patient?: Yes  Instructions For The Office: please call pt to schedule physical  Patient Refused Recommendation:  Patient Will Make Own Appointment  pt wants a physical. RN cannot schedule those.  RN Note:  Pt wants to schedule a physical and tried to do that this am before being transferred to a nurse. She said she might have used the wrong words to describe this pain in her back under her shoulder blade. OFFICE PLEASE CALL PT BACK TO SCHEDULE PHYSICAL.

## 2012-06-03 NOTE — Telephone Encounter (Signed)
Appt scheduled for rash, fasting labs and physical.

## 2012-06-06 ENCOUNTER — Ambulatory Visit (INDEPENDENT_AMBULATORY_CARE_PROVIDER_SITE_OTHER): Payer: BC Managed Care – PPO | Admitting: Family Medicine

## 2012-06-06 ENCOUNTER — Encounter: Payer: Self-pay | Admitting: Family Medicine

## 2012-06-06 ENCOUNTER — Other Ambulatory Visit (INDEPENDENT_AMBULATORY_CARE_PROVIDER_SITE_OTHER): Payer: BC Managed Care – PPO

## 2012-06-06 VITALS — BP 128/78 | HR 84 | Temp 98.0°F | Wt 215.5 lb

## 2012-06-06 DIAGNOSIS — I10 Essential (primary) hypertension: Secondary | ICD-10-CM

## 2012-06-06 DIAGNOSIS — E559 Vitamin D deficiency, unspecified: Secondary | ICD-10-CM

## 2012-06-06 DIAGNOSIS — D509 Iron deficiency anemia, unspecified: Secondary | ICD-10-CM

## 2012-06-06 DIAGNOSIS — R21 Rash and other nonspecific skin eruption: Secondary | ICD-10-CM

## 2012-06-06 DIAGNOSIS — R1013 Epigastric pain: Secondary | ICD-10-CM

## 2012-06-06 LAB — CBC WITH DIFFERENTIAL/PLATELET
Basophils Absolute: 0 10*3/uL (ref 0.0–0.1)
Eosinophils Relative: 1.3 % (ref 0.0–5.0)
MCV: 61.5 fl — ABNORMAL LOW (ref 78.0–100.0)
Monocytes Absolute: 0.6 10*3/uL (ref 0.1–1.0)
Neutrophils Relative %: 76.7 % (ref 43.0–77.0)
Platelets: 453 10*3/uL — ABNORMAL HIGH (ref 150.0–400.0)
RDW: 19.6 % — ABNORMAL HIGH (ref 11.5–14.6)
WBC: 8.5 10*3/uL (ref 4.5–10.5)

## 2012-06-06 LAB — COMPREHENSIVE METABOLIC PANEL
AST: 22 U/L (ref 0–37)
Albumin: 3.5 g/dL (ref 3.5–5.2)
Alkaline Phosphatase: 87 U/L (ref 39–117)
Potassium: 3.8 mEq/L (ref 3.5–5.1)
Sodium: 137 mEq/L (ref 135–145)
Total Bilirubin: 0.3 mg/dL (ref 0.3–1.2)
Total Protein: 7.5 g/dL (ref 6.0–8.3)

## 2012-06-06 LAB — IBC PANEL
Saturation Ratios: 3.2 % — ABNORMAL LOW (ref 20.0–50.0)
Transferrin: 333.6 mg/dL (ref 212.0–360.0)

## 2012-06-06 LAB — LIPASE: Lipase: 34 U/L (ref 11.0–59.0)

## 2012-06-06 NOTE — Progress Notes (Signed)
  Subjective:    Patient ID: Theresa Pratt, female    DOB: 07-08-1971, 40 y.o.   MRN: [id number]  HPI CC: skin rash  1 wk ago had bout of skin rash.  Very pruritic and papular.  Seen at [facility].  Unclear etiology.  ?drug rash, allergic, other.  Treated with permethrin cream which significantly helped.  Recently helped cousin who may have scabies.  Also wonders if ringworm rash returning.  Has cleaned house.  Under a lot of stress at work.  Past Medical History  Diagnosis Date  . Morbid obesity     Pre gastric bypass  . HTN (hypertension)     Pre gastric bypass  . DM (diabetes mellitus)     pre gastric bypass  . Arthropathy     weight bearing (pre gastric bypass)  . Oligomenorrhea     on Implanon (pre gastric bypass)  . Allergic rhinitis     post gastric bypass  . HTN (hypertension)     post gastric bypass  . GERD (gastroesophageal reflux disease)     post gastric bypass  . Pre-eclampsia     post gastric bypass  . Postpartum depression     post gastric bypass  . IDA (iron deficiency anemia)     post gastric bypass (iron infusion by hematology-doesn't build stores)      Review of Systems Per HPI    Objective:   Physical Exam  Nursing note and vitals reviewed. Skin: Skin is warm and dry. Rash noted.       Faint papular rash bilateral arms. L arm/forearm with postinflammatory hyperpigmented macules, new faint circular papular lesion upper right arm.       Assessment & Plan:

## 2012-06-06 NOTE — Assessment & Plan Note (Signed)
Resolving scabies.  Reassured.   New lesion L upper arm seems to be return of ringworm - recommended restart lotrimin.  Update [place] if rash not improving as expected

## 2012-06-06 NOTE — Addendum Note (Signed)
Addended by: Baldomero Lamy on: 06/06/2012 08:41 AM   Modules accepted: Orders

## 2012-06-06 NOTE — Patient Instructions (Signed)
May restart lotrimin to left upper arm rash. Good to see you today, call us with questions.

## 2012-06-08 ENCOUNTER — Other Ambulatory Visit: Payer: Self-pay | Admitting: Family Medicine

## 2012-06-08 MED ORDER — CHOLECALCIFEROL 25 MCG (1000 UT) PO CAPS: 1000.0000 [IU] | ORAL_CAPSULE | Freq: Every day | ORAL | Status: DC

## 2012-06-17 ENCOUNTER — Encounter: Payer: Self-pay | Admitting: Family Medicine

## 2012-06-17 ENCOUNTER — Ambulatory Visit (INDEPENDENT_AMBULATORY_CARE_PROVIDER_SITE_OTHER): Payer: BC Managed Care – PPO | Admitting: Family Medicine

## 2012-06-17 VITALS — BP 132/80 | HR 88 | Temp 97.9°F | Ht 66.0 in | Wt 215.0 lb

## 2012-06-17 DIAGNOSIS — E559 Vitamin D deficiency, unspecified: Secondary | ICD-10-CM

## 2012-06-17 DIAGNOSIS — Z3202 Encounter for pregnancy test, result negative: Secondary | ICD-10-CM

## 2012-06-17 DIAGNOSIS — D509 Iron deficiency anemia, unspecified: Secondary | ICD-10-CM

## 2012-06-17 DIAGNOSIS — Z0000 Encounter for general adult medical examination without abnormal findings: Secondary | ICD-10-CM | POA: Insufficient documentation

## 2012-06-17 DIAGNOSIS — I10 Essential (primary) hypertension: Secondary | ICD-10-CM

## 2012-06-17 DIAGNOSIS — Z0001 Encounter for general adult medical examination with abnormal findings: Secondary | ICD-10-CM | POA: Insufficient documentation

## 2012-06-17 MED ORDER — VITAMIN D 50 MCG (2000 UT) PO CAPS: 2000.0000 [IU] | ORAL_CAPSULE | Freq: Every day | ORAL | Status: DC

## 2012-06-17 NOTE — Assessment & Plan Note (Signed)
Preventative protocols reviewed and updated unless pt declined. Discussed healthy diet and lifestyle. Seat belt use discussed.

## 2012-06-17 NOTE — Assessment & Plan Note (Signed)
Restart 2000 IU daily.

## 2012-06-17 NOTE — Addendum Note (Signed)
Addended by: Sueanne Margarita on: 06/17/2012 04:23 PM   Modules accepted: Orders

## 2012-06-17 NOTE — Assessment & Plan Note (Signed)
Chronic, stable. Continue hctz 12.5mg daily.  

## 2012-06-17 NOTE — Patient Instructions (Addendum)
Let's check Upreg today. Good to see you today, call us with questions. Continue iron orally for next 2 weeks, we will recheck blood counts in 2 weeks (return for lab visit). Depending on results, will see if iron infusion needed. Let me know sooner if worsening symptoms like dizziness, shortness of breath, worsening headaches. Start vitamin D 2000 IU daily. Good to see you today, call us with questions.

## 2012-06-17 NOTE — Progress Notes (Signed)
Subjective:    Patient ID: Theresa Pratt, female    DOB: 10/14/1970, 41 y.o.   MRN: [id number]  HPI CC: CPE  Previously dx with scabies and treated with permethrin which helped.  Itching continued, so she called [facility].  Currently on prednisone, zantac, and claritin for itching prescribed by [facility].  On day 7 of prednisone, currently on 20mg .    recent blood work - found to have anemia with Hgb of 8.4, MCV 64, low iron levels with ferritin down to 5.9 and iron of 15.  GERD - doing well with zantac.  May continue after above course is over.  Currently in school for psychology.  Wt Readings from Last 3 Encounters:  06/17/12 215 lb (97.523 kg)  06/06/12 215 lb 8 oz (97.75 kg)  04/29/12 218 lb 8 oz (99.111 kg)  weight stable.  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  Wants to be substance abuse counselor Activity: no regular exercise Diet: good water, fruits/vegetables daily, does like juice  LMP - 05/16/2012. Recently with heavy periods - periods lasting 1 wk, heavy bleeding throughout. Implanon wore off 02/2012 (placed 03/2009).  Preventative:  Well woman - [facility]. Has scheduled appt on Friday.  Pap done 2011, normal. Has not had mammogram but would like to schedule.  Recently missed appointment. Received flu shot - 2013. Tetanus shot - 02/2009  ??flex sig - 2006 (per patient done at [facility], will request records)  Medications and allergies reviewed and updated in chart.  Past histories reviewed and updated if relevant as below. Patient Active Problem List  Diagnosis  . UNSPECIFIED ESSENTIAL HYPERTENSION  . UNSPECIFIED VITAMIN D DEFICIENCY  . ANEMIA, IRON DEFICIENCY  . ALLERGIC RHINITIS  . OTHER&UNSPECIFIED DISEASES THE ORAL SOFT TISSUES  . GERD  . BREAST MASS, LEFT  . BACK PAIN, LUMBAR  . BARIATRIC SURGERY STATUS  .  Skin rash  . Tinea corporis  . Muscle strain   Past Medical History  Diagnosis Date  . Morbid obesity     Pre gastric bypass  . HTN (hypertension)     Pre gastric bypass  . DM (diabetes mellitus)     pre gastric bypass  . Arthropathy     weight bearing (pre gastric bypass)  . Oligomenorrhea     on Implanon (pre gastric bypass)  . Allergic rhinitis     post gastric bypass  . HTN (hypertension)     post gastric bypass  . GERD (gastroesophageal reflux disease)     post gastric bypass  . Pre-eclampsia     post gastric bypass  . Postpartum depression     post gastric bypass  . IDA (iron deficiency anemia)     post gastric bypass (iron infusion by hematology-doesn't build stores)   Past Surgical History  Procedure Date  . 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 Rectal condyloma removal 2006  . Implanon placed 03/16/09    (3 years)  . Cholecystectomy 08/2010   History  Substance Use Topics  . Smoking status: Never Smoker   . Smokeless tobacco: Never Used  . Alcohol Use: Yes     Comment: occasional   Family History  Problem Relation Age of Onset  . Coronary artery disease Father     2 stents; + smoker  . Hypertension Father   . Thyroid disease Brother   .  Diabetes Maternal Grandmother   . Cirrhosis Paternal Grandmother   . Alcohol abuse Paternal Grandmother   . Breast cancer      Paternal great aunt(obesity, smoker, EtOHic)  . Breast cancer      Maternal great aunt (deceased in 24's from same)  . Thyroid disease Other     cousins and grandmother  . Cancer Other     great aunt - lung  . Liver disease Paternal Aunt    No Known Allergies Current Outpatient Prescriptions on File Prior to Visit  Medication Sig Dispense Refill  . amitriptyline (ELAVIL) 10 MG tablet Take 1 tablet (10 mg total) by mouth at bedtime.  30 tablet  3  . etonogestrel (IMPLANON) 68 MG IMPL implant Inject 1 each into the skin once.      . hydrochlorothiazide (MICROZIDE) 12.5 MG capsule  Take 12.5 mg by mouth daily.      . hydrOXYzine (ATARAX/VISTARIL) 25 MG tablet Take 25 mg by mouth 3 (three) times daily as needed.      . loratadine (CLARITIN) 10 MG tablet Take 10 mg by mouth daily.      . Multiple Vitamin (MULTIVITAMIN) tablet Take 1 tablet by mouth daily.        . ranitidine (ZANTAC) 150 MG tablet Take 150 mg by mouth 2 (two) times daily.         Review of Systems  Constitutional: Negative for fever, chills, activity change, appetite change, fatigue and unexpected weight change.  HENT: Negative for hearing loss and neck pain.   Eyes: Negative for visual disturbance.  Respiratory: Negative for cough, chest tightness, shortness of breath and wheezing.   Cardiovascular: Negative for chest pain, palpitations and leg swelling.  Gastrointestinal: Negative for nausea, vomiting, abdominal pain, diarrhea, constipation, blood in stool and abdominal distention.  Genitourinary: Negative for hematuria and difficulty urinating.  Musculoskeletal: Negative for myalgias and arthralgias.  Skin: Negative for rash.  Neurological: Negative for dizziness, seizures, syncope and headaches.  Hematological: Does not bruise/bleed easily.  Psychiatric/Behavioral: Negative for dysphoric mood. The patient is not nervous/anxious.        Objective:   Physical Exam  Nursing note and vitals reviewed. Constitutional: She is oriented to person, place, and time. She appears well-developed and well-nourished. No distress.  HENT:  Head: Normocephalic and atraumatic.  Right Ear: External ear normal.  Left Ear: External ear normal.  Nose: Nose normal.  Mouth/Throat: Oropharynx is clear and moist. No oropharyngeal exudate.  Eyes: Conjunctivae normal and EOM are normal. Pupils are equal, round, and reactive to light. No scleral icterus.  Neck: Normal range of motion. Neck supple. No thyromegaly present.  Cardiovascular: Normal rate, regular rhythm, normal heart sounds and intact distal pulses.   No  murmur heard. Pulses:      Radial pulses are 2+ on the right side, and 2+ on the left side.  Pulmonary/Chest: Effort normal and breath sounds normal. No respiratory distress. She has no wheezes. She has no rales.  Abdominal: Soft. Bowel sounds are normal. She exhibits no distension and no mass. There is no tenderness. There is no rebound and no guarding.  Musculoskeletal: Normal range of motion. She exhibits no edema.  Lymphadenopathy:    She has no cervical adenopathy.  Neurological: She is alert and oriented to person, place, and time.       CN grossly intact, station and gait intact  Skin: Skin is warm and dry. No rash noted.  Psychiatric: She has a normal mood and affect.  Her behavior is normal. Judgment and thought content normal.       Assessment & Plan:

## 2012-06-17 NOTE — Assessment & Plan Note (Signed)
Significant with very low iron stores.  Feeling better after starting supplementation.  Will continue oral iron for next 2 wks, recheck levels.  If not much improved, consider iron infusion given h/o gastric bypass (roux en y).  Pt agrees with plan.  Denies sxs.

## 2012-06-20 ENCOUNTER — Encounter: Payer: Self-pay | Admitting: Obstetrics and Gynecology

## 2012-06-20 ENCOUNTER — Ambulatory Visit (INDEPENDENT_AMBULATORY_CARE_PROVIDER_SITE_OTHER): Payer: BC Managed Care – PPO | Admitting: Obstetrics and Gynecology

## 2012-06-20 VITALS — Ht 65.0 in

## 2012-06-20 DIAGNOSIS — D509 Iron deficiency anemia, unspecified: Secondary | ICD-10-CM

## 2012-06-20 DIAGNOSIS — Z124 Encounter for screening for malignant neoplasm of cervix: Secondary | ICD-10-CM

## 2012-06-20 DIAGNOSIS — Z01419 Encounter for gynecological examination (general) (routine) without abnormal findings: Secondary | ICD-10-CM

## 2012-06-20 NOTE — Progress Notes (Signed)
  Subjective:     Theresa Pratt is a 41 y.o. female G2P2 with LMP 06/19/2012 who is here for a comprehensive physical exam. The patient reports no problems. Patient with 7 days period monthly with implanon and request BTL. Patient suffers from iron deficiency anemia and would like to decrease her period length and flow. Patient is currently under the care of a hematologist for this issue.  History   Social History  . Marital Status: Married    Spouse Name: N/A    Number of Children: 2  . Years of Education: N/A   Occupational History  . [employer] Head    Social History Main Topics  . Smoking status: Never Smoker   . Smokeless tobacco: Never Used  . Alcohol Use: Yes     Comment: occasional  . Drug Use: No  . Sexually Active: Yes -- Female partner(s)    Birth Control/ Protection: None   Other Topics Concern  . 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  Wants to be substance abuse counselorActivity: no regular exerciseDiet: good water, fruits/vegetables daily, does like juice   Health Maintenance  Topic Date Due  . Pap Smear  07/16/2012  . Influenza Vaccine  03/16/2013  . Tetanus/tdap  02/28/2019       Review of Systems A comprehensive review of systems was negative.   Objective:     GENERAL: Well-developed, well-nourished female in no acute distress.  HEENT: Normocephalic, atraumatic. Sclerae anicteric.  NECK: Supple. Normal thyroid.  LUNGS: Clear to auscultation bilaterally.  HEART: Regular rate and rhythm. BREASTS: Symmetric in size. No palpable masses or lymphadenopathy, skin changes, or nipple drainage. Positive fibrocystic changes ABDOMEN: Soft, nontender, nondistended. No organomegaly. PELVIC: Normal external female genitalia. Vagina is pink and rugated.  Normal discharge.  Normal appearing cervix. Uterus is normal in size. No adnexal mass or tenderness. EXTREMITIES: No cyanosis, clubbing, or edema, 2+ distal pulses.    Assessment:    Healthy female exam.      Plan:    pap smear collected Advised patient to continue monthly self breast and vulva exam Patient with iron deficiency anemia s/p gastric bypass with menses lasting 7 days. Patient desires a method that will minimize blood loss. Discussed Mirena IUD as a good option. Patient will return for Implanon removal and IUD insertion next available. Patient is scheduled to have mammogram done this month See After Visit Summary for Counseling Recommendations

## 2012-06-20 NOTE — Patient Instructions (Signed)
Levonorgestrel intrauterine device (IUD) What is this medicine? LEVONORGESTREL IUD (LEE voe nor jes trel) is a contraceptive (birth control) device. It is used to prevent pregnancy and to treat heavy bleeding that occurs during your period. It can be used for up to 5 years. This medicine may be used for other purposes; ask your health care provider or pharmacist if you have questions. What should I tell my health care provider before I take this medicine? They need to know if you have any of these conditions: -abnormal Pap smear -cancer of the breast, uterus, or cervix -diabetes -endometritis -genital or pelvic infection now or in the past -have more than one sexual partner or your partner has more than one partner -heart disease -history of an ectopic or tubal pregnancy -immune system problems -IUD in place -liver disease or tumor -problems with blood clots or take blood-thinners -use intravenous drugs -uterus of unusual shape -vaginal bleeding that has not been explained -an unusual or allergic reaction to levonorgestrel, other hormones, silicone, or polyethylene, medicines, foods, dyes, or preservatives -pregnant or trying to get pregnant -breast-feeding How should I use this medicine? This device is placed inside the uterus by a health care professional. Talk to your pediatrician regarding the use of this medicine in children. Special care may be needed. Overdosage: If you think you have taken too much of this medicine contact a poison control center or emergency room at once. NOTE: This medicine is only for you. Do not share this medicine with others. What if I miss a dose? This does not apply. What may interact with this medicine? Do not take this medicine with any of the following medications: -amprenavir -bosentan -fosamprenavir This medicine may also interact with the following medications: -aprepitant -barbiturate medicines for inducing sleep or treating  seizures -bexarotene -griseofulvin -medicines to treat seizures like carbamazepine, ethotoin, felbamate, oxcarbazepine, phenytoin, topiramate -modafinil -pioglitazone -rifabutin -rifampin -rifapentine -some medicines to treat HIV infection like atazanavir, indinavir, lopinavir, nelfinavir, tipranavir, ritonavir -St. John's wort -warfarin This list may not describe all possible interactions. Give your health care provider a list of all the medicines, herbs, non-prescription drugs, or dietary supplements you use. Also tell them if you smoke, drink alcohol, or use illegal drugs. Some items may interact with your medicine. What should I watch for while using this medicine? Visit your doctor or health care professional for regular check ups. See your doctor if you or your partner has sexual contact with others, becomes HIV positive, or gets a sexual transmitted disease. This product does not protect you against HIV infection (AIDS) or other sexually transmitted diseases. You can check the placement of the IUD yourself by reaching up to the top of your vagina with clean fingers to feel the threads. Do not pull on the threads. It is a good habit to check placement after each menstrual period. Call your doctor right away if you feel more of the IUD than just the threads or if you cannot feel the threads at all. The IUD may come out by itself. You may become pregnant if the device comes out. If you notice that the IUD has come out use a backup birth control method like condoms and call your health care provider. Using tampons will not change the position of the IUD and are okay to use during your period. What side effects may I notice from receiving this medicine? Side effects that you should report to your doctor or health care professional as soon as possible: -allergic reactions  like skin rash, itching or hives, swelling of the face, lips, or tongue -fever, flu-like symptoms -genital sores -high  blood pressure -no menstrual period for 6 weeks during use -pain, swelling, warmth in the leg -pelvic pain or tenderness -severe or sudden headache -signs of pregnancy -stomach cramping -sudden shortness of breath -trouble with balance, talking, or walking -unusual vaginal bleeding, discharge -yellowing of the eyes or skin Side effects that usually do not require medical attention (report to your doctor or health care professional if they continue or are bothersome): -acne -breast pain -change in sex drive or performance -changes in weight -cramping, dizziness, or faintness while the device is being inserted -headache -irregular menstrual bleeding within first 3 to 6 months of use -nausea This list may not describe all possible side effects. Call your doctor for medical advice about side effects. You may report side effects to FDA at 1-800-FDA-1088. Where should I keep my medicine? This does not apply. NOTE: This sheet is a summary. It may not cover all possible information. If you have questions about this medicine, talk to your doctor, pharmacist, or health care provider.  2012, [employer]vier/Gold Standard. (07/23/2008 6:39:08 PM) Preventive Care for Adults, Female A healthy lifestyle and preventive care can promote health and wellness. Preventive health guidelines for women include the following key practices.  A routine yearly physical is a good way to check with your caregiver about your health and preventive screening. It is a chance to share any concerns and updates on your health, and to receive a thorough exam.  Visit your dentist for a routine exam and preventive care every 6 months. Brush your teeth twice a day and floss once a day. Good oral hygiene prevents tooth decay and gum disease.  The frequency of eye exams is based on your age, health, family medical history, use of contact lenses, and other factors. Follow your caregiver's recommendations for frequency of eye  exams.  Eat a healthy diet. Foods like vegetables, fruits, whole grains, low-fat dairy products, and lean protein foods contain the nutrients you need without too many calories. Decrease your intake of foods high in solid fats, added sugars, and salt. Eat the right amount of calories for you.Get information about a proper diet from your caregiver, if necessary.  Regular physical exercise is one of the most important things you can do for your health. Most adults should get at least 150 minutes of moderate-intensity exercise (any activity that increases your heart rate and causes you to sweat) each week. In addition, most adults need muscle-strengthening exercises on 2 or more days a week.  Maintain a healthy weight. The body mass index (BMI) is a screening tool to identify possible weight problems. It provides an estimate of body fat based on height and weight. Your caregiver can help determine your BMI, and can help you achieve or maintain a healthy weight.For adults 20 years and older:  A BMI below 18.5 is considered underweight.  A BMI of 18.5 to 24.9 is normal.  A BMI of 25 to 29.9 is considered overweight.  A BMI of 30 and above is considered obese.  Maintain normal blood lipids and cholesterol levels by exercising and minimizing your intake of saturated fat. Eat a balanced diet with plenty of fruit and vegetables. Blood tests for lipids and cholesterol should begin at age 70 and be repeated every 5 years. If your lipid or cholesterol levels are high, you are over 50, or you are at high risk for heart disease,  you may need your cholesterol levels checked more frequently.Ongoing high lipid and cholesterol levels should be treated with medicines if diet and exercise are not effective.  If you smoke, find out from your caregiver how to quit. If you do not use tobacco, do not start.  If you are pregnant, do not drink alcohol. If you are breastfeeding, be very cautious about drinking alcohol.  If you are not pregnant and choose to drink alcohol, do not exceed 1 drink per day. One drink is considered to be 12 ounces (355 mL) of beer, 5 ounces (148 mL) of wine, or 1.5 ounces (44 mL) of liquor.  Avoid use of street drugs. Do not share needles with anyone. Ask for help if you need support or instructions about stopping the use of drugs.  High blood pressure causes heart disease and increases the risk of stroke. Your blood pressure should be checked at least every 1 to 2 years. Ongoing high blood pressure should be treated with medicines if weight loss and exercise are not effective.  If you are 31 to 43 years old, ask your caregiver if you should take aspirin to prevent strokes.  Diabetes screening involves taking a blood sample to check your fasting blood sugar level. This should be done once every 3 years, after age 55, if you are within normal weight and without risk factors for diabetes. Testing should be considered at a younger age or be carried out more frequently if you are overweight and have at least 1 risk factor for diabetes.  Breast cancer screening is essential preventive care for women. You should practice "breast self-awareness." This means understanding the normal appearance and feel of your breasts and may include breast self-examination. Any changes detected, no matter how small, should be reported to a caregiver. Women in their 76s and 30s should have a clinical breast exam (CBE) by a caregiver as part of a regular health exam every 1 to 3 years. After age 20, women should have a CBE every year. Starting at age 26, women should consider having a mammography (breast X-ray test) every year. Women who have a family history of breast cancer should talk to their caregiver about genetic screening. Women at a high risk of breast cancer should talk to their caregivers about having magnetic resonance imaging (MRI) and a mammography every year.  The Pap test is a screening test for  cervical cancer. A Pap test can show cell changes on the cervix that might become cervical cancer if left untreated. A Pap test is a procedure in which cells are obtained and examined from the lower end of the uterus (cervix).  Women should have a Pap test starting at age 49.  Between ages 33 and 70, Pap tests should be repeated every 2 years.  Beginning at age 6, you should have a Pap test every 3 years as long as the past 3 Pap tests have been normal.  Some women have medical problems that increase the chance of getting cervical cancer. Talk to your caregiver about these problems. It is especially important to talk to your caregiver if a new problem develops soon after your last Pap test. In these cases, your caregiver may recommend more frequent screening and Pap tests.  The above recommendations are the same for women who have or have not gotten the vaccine for human papillomavirus (HPV).  If you had a hysterectomy for a problem that was not cancer or a condition that could lead to cancer, then  you no longer need Pap tests. Even if you no longer need a Pap test, a regular exam is a good idea to make sure no other problems are starting.  If you are between ages 36 and 64, and you have had normal Pap tests going back 10 years, you no longer need Pap tests. Even if you no longer need a Pap test, a regular exam is a good idea to make sure no other problems are starting.  If you have had past treatment for cervical cancer or a condition that could lead to cancer, you need Pap tests and screening for cancer for at least 20 years after your treatment.  If Pap tests have been discontinued, risk factors (such as a new sexual partner) need to be reassessed to determine if screening should be resumed.  The HPV test is an additional test that may be used for cervical cancer screening. The HPV test looks for the virus that can cause the cell changes on the cervix. The cells collected during the Pap test  can be tested for HPV. The HPV test could be used to screen women aged 47 years and older, and should be used in women of any age who have unclear Pap test results. After the age of 87, women should have HPV testing at the same frequency as a Pap test.  Colorectal cancer can be detected and often prevented. Most routine colorectal cancer screening begins at the age of 79 and continues through age 97. However, your caregiver may recommend screening at an earlier age if you have risk factors for colon cancer. On a yearly basis, your caregiver may provide home test kits to check for hidden blood in the stool. Use of a small camera at the end of a tube, to directly examine the colon (sigmoidoscopy or colonoscopy), can detect the earliest forms of colorectal cancer. Talk to your caregiver about this at age 32, when routine screening begins. Direct examination of the colon should be repeated every 5 to 10 years through age 16, unless early forms of pre-cancerous polyps or small growths are found.  Hepatitis C blood testing is recommended for all people born from [birth year] through 1965 and any individual with known risks for hepatitis C.  Practice safe sex. Use condoms and avoid high-risk sexual practices to reduce the spread of sexually transmitted infections (STIs). STIs include gonorrhea, chlamydia, syphilis, trichomonas, herpes, HPV, and human immunodeficiency virus (HIV). Herpes, HIV, and HPV are viral illnesses that have no cure. They can result in disability, cancer, and death. Sexually active women aged 30 and younger should be checked for chlamydia. Older women with new or multiple partners should also be tested for chlamydia. Testing for other STIs is recommended if you are sexually active and at increased risk.  Osteoporosis is a disease in which the bones lose minerals and strength with aging. This can result in serious bone fractures. The risk of osteoporosis can be identified using a bone density scan.  Women ages 4 and over and women at risk for fractures or osteoporosis should discuss screening with their caregivers. Ask your caregiver whether you should take a calcium supplement or vitamin D to reduce the rate of osteoporosis.  Menopause can be associated with physical symptoms and risks. Hormone replacement therapy is available to decrease symptoms and risks. You should talk to your caregiver about whether hormone replacement therapy is right for you.  Use sunscreen with sun protection factor (SPF) of 30 or more. Apply sunscreen liberally  and repeatedly throughout the day. You should seek shade when your shadow is shorter than you. Protect yourself by wearing long sleeves, pants, a wide-brimmed hat, and sunglasses year round, whenever you are outdoors.  Once a month, do a whole body skin exam, using a mirror to look at the skin on your back. Notify your caregiver of new moles, moles that have irregular borders, moles that are larger than a pencil eraser, or moles that have changed in shape or color.  Stay current with required immunizations.  Influenza. You need a dose every fall (or winter). The composition of the flu vaccine changes each year, so being vaccinated once is not enough.  Pneumococcal polysaccharide. You need 1 to 2 doses if you smoke cigarettes or if you have certain chronic medical conditions. You need 1 dose at age 26 (or older) if you have never been vaccinated.  Tetanus, diphtheria, pertussis (Tdap, Td). Get 1 dose of Tdap vaccine if you are younger than age 50, are over 65 and have contact with an infant, are a Research scientist (physical sciences), are pregnant, or simply want to be protected from whooping cough. After that, you need a Td booster dose every 10 years. Consult your caregiver if you have not had at least 3 tetanus and diphtheria-containing shots sometime in your life or have a deep or dirty wound.  HPV. You need this vaccine if you are a woman age 62 or younger. The vaccine is  given in 3 doses over 6 months.  Measles, mumps, rubella (MMR). You need at least 1 dose of MMR if you were born in 1957 or later. You may also need a second dose.  Meningococcal. If you are age 57 to 6 and a first-year college student living in a residence hall, or have one of several medical conditions, you need to get vaccinated against meningococcal disease. You may also need additional booster doses.  Zoster (shingles). If you are age 53 or older, you should get this vaccine.  Varicella (chickenpox). If you have never had chickenpox or you were vaccinated but received only 1 dose, talk to your caregiver to find out if you need this vaccine.  Hepatitis A. You need this vaccine if you have a specific risk factor for hepatitis A virus infection or you simply wish to be protected from this disease. The vaccine is usually given as 2 doses, 6 to 18 months apart.  Hepatitis B. You need this vaccine if you have a specific risk factor for hepatitis B virus infection or you simply wish to be protected from this disease. The vaccine is given in 3 doses, usually over 6 months. Preventive Services / Frequency Ages 75 to 46  Blood pressure check.** / Every 1 to 2 years.  Lipid and cholesterol check.** / Every 5 years beginning at age 52.  Clinical breast exam.** / Every year after age 80.  Mammogram.** / Every year beginning at age 43 and continuing for as long as you are in good health. Consult with your caregiver.  Pap test.** / Every 3 years starting at age 48 through age 73 or 66 with a history of 3 consecutive normal Pap tests.  HPV screening.** / Every 3 years from ages 60 through ages 27 to 89 with a history of 3 consecutive normal Pap tests.  Fecal occult blood test (FOBT) of stool. / Every year beginning at age 68 and continuing until age 30. You may not need to do this test if you get a colonoscopy  every 10 years.  Flexible sigmoidoscopy or colonoscopy.** / Every 5 years for a  flexible sigmoidoscopy or every 10 years for a colonoscopy beginning at age 72 and continuing until age 40.  Hepatitis C blood test.** / For all people born from [birth year] through 1967 and any individual with known risks for hepatitis C.  Skin self-exam. / Monthly.  Influenza immunization.** / Every year.  Pneumococcal polysaccharide immunization.** / 1 to 2 doses if you smoke cigarettes or if you have certain chronic medical conditions.  Tetanus, diphtheria, pertussis (Tdap, Td) immunization.** / A one-time dose of Tdap vaccine. After that, you need a Td booster dose every 10 years.  Measles, mumps, rubella (MMR) immunization. / You need at least 1 dose of MMR if you were born in 1957 or later. You may also need a second dose.  Varicella immunization.** / Consult your caregiver.  Meningococcal immunization.** / Consult your caregiver.  Hepatitis A immunization.** / Consult your caregiver. 2 doses, 6 to 18 months apart.  Hepatitis B immunization.** / Consult your caregiver. 3 doses, usually over 6 months. ** Family history and personal history of risk and conditions may change your caregiver's recommendations. Document Released: 08/28/2001 Document Revised: 09/24/2011 Document Reviewed: 11/27/2010 [facility] Patient Information 2013 [place], [place].

## 2012-06-20 NOTE — Progress Notes (Signed)
Yearly exam, needs to discuss birth control options, she is interested in tubal ligation and having implanon removed.  She is anemic and bleeds heavy when she has the implanon in.  It is time to have this removed.

## 2012-06-24 ENCOUNTER — Telehealth: Payer: Self-pay

## 2012-06-24 NOTE — Telephone Encounter (Signed)
Pt request results of mammogram done on 06/20/12 at [facility] imaging on [address] When available.

## 2012-06-27 NOTE — Telephone Encounter (Signed)
Called imaging center and they said we haven't gotten results back yet because she was a Birads 0 and was coming back on 06/30/12 for a diagnostic study. They said we will have results after that study. Patient notified.

## 2012-06-27 NOTE — Telephone Encounter (Addendum)
Plz notify we haven't received yet - can we call [facility] imaging for fax of results

## 2012-06-27 NOTE — Telephone Encounter (Signed)
Have we received these results yet? I don't see them in her chart, so I just wanted to double check. I wasn't sure how long they take to come in.

## 2012-06-27 NOTE — Telephone Encounter (Signed)
Noted. Discussed with pt.  Will await results.

## 2012-06-30 ENCOUNTER — Other Ambulatory Visit (INDEPENDENT_AMBULATORY_CARE_PROVIDER_SITE_OTHER): Payer: BC Managed Care – PPO

## 2012-06-30 ENCOUNTER — Other Ambulatory Visit: Payer: Self-pay | Admitting: Family Medicine

## 2012-06-30 ENCOUNTER — Encounter: Payer: Self-pay | Admitting: Family Medicine

## 2012-06-30 DIAGNOSIS — D509 Iron deficiency anemia, unspecified: Secondary | ICD-10-CM

## 2012-06-30 DIAGNOSIS — K909 Intestinal malabsorption, unspecified: Secondary | ICD-10-CM | POA: Insufficient documentation

## 2012-06-30 LAB — CBC WITH DIFFERENTIAL/PLATELET
Basophils Absolute: 0 10*3/uL (ref 0.0–0.1)
Eosinophils Absolute: 0.1 10*3/uL (ref 0.0–0.7)
Hemoglobin: 8.7 g/dL — ABNORMAL LOW (ref 12.0–15.0)
Lymphocytes Relative: 16.8 % (ref 12.0–46.0)
MCHC: 30.8 g/dL (ref 30.0–36.0)
Neutro Abs: 6.9 10*3/uL (ref 1.4–7.7)
RDW: 20.3 % — ABNORMAL HIGH (ref 11.5–14.6)

## 2012-07-01 ENCOUNTER — Encounter: Payer: Self-pay | Admitting: Family Medicine

## 2012-07-01 ENCOUNTER — Encounter: Payer: Self-pay | Admitting: *Deleted

## 2012-07-04 ENCOUNTER — Encounter: Payer: Self-pay | Admitting: Obstetrics and Gynecology

## 2012-07-04 ENCOUNTER — Ambulatory Visit (INDEPENDENT_AMBULATORY_CARE_PROVIDER_SITE_OTHER): Payer: BC Managed Care – PPO | Admitting: Obstetrics and Gynecology

## 2012-07-04 ENCOUNTER — Other Ambulatory Visit (HOSPITAL_COMMUNITY): Payer: Self-pay | Admitting: *Deleted

## 2012-07-04 VITALS — BP 139/81 | HR 106 | Wt 211.0 lb

## 2012-07-04 DIAGNOSIS — Z01812 Encounter for preprocedural laboratory examination: Secondary | ICD-10-CM

## 2012-07-04 DIAGNOSIS — Z3046 Encounter for surveillance of implantable subdermal contraceptive: Secondary | ICD-10-CM

## 2012-07-04 DIAGNOSIS — N390 Urinary tract infection, site not specified: Secondary | ICD-10-CM

## 2012-07-04 DIAGNOSIS — N920 Excessive and frequent menstruation with regular cycle: Secondary | ICD-10-CM

## 2012-07-04 DIAGNOSIS — Z30430 Encounter for insertion of intrauterine contraceptive device: Secondary | ICD-10-CM

## 2012-07-04 DIAGNOSIS — Z975 Presence of (intrauterine) contraceptive device: Secondary | ICD-10-CM

## 2012-07-04 LAB — POCT URINALYSIS DIPSTICK
Blood, UA: NEGATIVE
Protein, UA: NEGATIVE
Spec Grav, UA: 1.02
Urobilinogen, UA: 0.2
pH, UA: 5

## 2012-07-04 MED ORDER — NITROFURANTOIN MONOHYD MACRO 100 MG PO CAPS: 100.0000 mg | ORAL_CAPSULE | Freq: Two times a day (BID) | ORAL | Status: DC

## 2012-07-04 NOTE — Addendum Note (Signed)
Addended by: Vinnie Langton C on: 07/04/2012 10:34 AM   Modules accepted: Orders

## 2012-07-04 NOTE — Progress Notes (Signed)
Patient ID: Theresa Pratt, female   DOB: 10-27-1970, 41 y.o.   MRN: [id number] 41 yo G2P2 with anemia secondary to menorrhagia here for implanon removal and IUD insertion. Patient scheduled to have iron infusion on Monday. Patient also complaining of dysuria and frequency. Will send a urine culture  Patient given informed consent, signed copy in the chart, time out was performed. Pregnancy test was negative.  Removal Patient given informed consent for removal of her Implanon, time out was performed.  Signed copy in the chart.  Appropriate time out taken. Implanon site identified.  Area prepped in usual sterile fashon. One cc of 1% lidocaine was used to anesthetize the area at the distal end of the implant. A small stab incision was made right beside the implant on the distal portion.  The implanon rod was grasped using hemostats and removed without difficulty.  There was less than 3 cc blood loss. There were no complications.  A small amount of antibiotic ointment and steri-strips were applied over the small incision.  A pressure bandage was applied to reduce any bruising.  The patient tolerated the procedure well and was given post procedure instructions.  IUD Procedure Note Speculum placed in the vagina.  Cervix visualized.  Cleaned with Betadine x 2.  Grasped anteriorly with a single tooth tenaculum.  Uterus sounded to 7 cm.  Mirena IUD placed per manufacturer's recommendations.  Strings trimmed to 3 cm. Tenaculum was removed, good hemostasis noted.  Patient tolerated procedure well.   Patient given post procedure instructions and Mirena care card with expiration date.  Patient is asked to check IUD strings periodically and follow up in 4-6 weeks for IUD check.

## 2012-07-06 LAB — URINE CULTURE: Colony Count: 35000

## 2012-07-07 ENCOUNTER — Encounter (HOSPITAL_COMMUNITY)
Admission: RE | Admit: 2012-07-07 | Discharge: 2012-07-07 | Disposition: A | Discharge status: Home / Self Care | Payer: BC Managed Care – PPO | Source: Ambulatory Visit | Attending: Family Medicine | Admitting: Family Medicine

## 2012-07-07 DIAGNOSIS — D509 Iron deficiency anemia, unspecified: Secondary | ICD-10-CM | POA: Insufficient documentation

## 2012-07-07 MED ORDER — SODIUM CHLORIDE 0.9 % IV SOLN
200.0000 mg | INTRAVENOUS | Status: DC
  Administered 2012-07-07: 200 mg via INTRAVENOUS
  Filled 2012-07-07: qty 10

## 2012-07-21 ENCOUNTER — Encounter (HOSPITAL_COMMUNITY)
Admission: RE | Admit: 2012-07-21 | Discharge: 2012-07-21 | Disposition: A | Discharge status: Home / Self Care | Payer: BC Managed Care – PPO | Source: Ambulatory Visit | Attending: Family Medicine | Admitting: Family Medicine

## 2012-07-21 DIAGNOSIS — D509 Iron deficiency anemia, unspecified: Secondary | ICD-10-CM | POA: Insufficient documentation

## 2012-07-21 MED ORDER — SODIUM CHLORIDE 0.9 % IV SOLN
200.0000 mg | INTRAVENOUS | Status: DC
  Administered 2012-07-21: 200 mg via INTRAVENOUS
  Filled 2012-07-21: qty 10

## 2012-08-01 ENCOUNTER — Ambulatory Visit: Payer: BC Managed Care – PPO | Admitting: Family Medicine

## 2012-08-01 DIAGNOSIS — Z30431 Encounter for routine checking of intrauterine contraceptive device: Secondary | ICD-10-CM

## 2012-08-04 ENCOUNTER — Telehealth: Payer: Self-pay

## 2012-08-04 DIAGNOSIS — D509 Iron deficiency anemia, unspecified: Secondary | ICD-10-CM

## 2012-08-04 DIAGNOSIS — I10 Essential (primary) hypertension: Secondary | ICD-10-CM

## 2012-08-04 NOTE — Telephone Encounter (Signed)
Patient notified and lab appt scheduled.  

## 2012-08-04 NOTE — Telephone Encounter (Signed)
Pt left v/m; pt is being tested for allergies and pt wants to know if had thyroid testing recently if not will Dr Theresa Pratt order thyroid testing to make sure no problem with thyroid.Please advise.

## 2012-08-04 NOTE — Telephone Encounter (Signed)
Lab Results  Component Value Date   TSH 0.601 06/22/2010  plz notify ok to come in for thyroid test.  Placed order in chart.

## 2012-08-08 ENCOUNTER — Other Ambulatory Visit: Payer: BC Managed Care – PPO

## 2012-08-22 ENCOUNTER — Other Ambulatory Visit: Payer: BC Managed Care – PPO

## 2013-03-12 ENCOUNTER — Telehealth: Payer: Self-pay | Admitting: *Deleted

## 2013-03-12 DIAGNOSIS — D509 Iron deficiency anemia, unspecified: Secondary | ICD-10-CM

## 2013-03-12 NOTE — Telephone Encounter (Signed)
Patient coming in for labs next week and asked if she could have her iron rechecked since her infusions. She said she is feeling MUCH better, just a little tired. All I saw ordered was a TSH.

## 2013-03-13 NOTE — Telephone Encounter (Signed)
Added on

## 2013-03-13 NOTE — Telephone Encounter (Signed)
Patient notified

## 2013-03-17 ENCOUNTER — Ambulatory Visit: Payer: BC Managed Care – PPO

## 2013-03-17 ENCOUNTER — Other Ambulatory Visit: Payer: BC Managed Care – PPO

## 2013-03-26 ENCOUNTER — Other Ambulatory Visit (INDEPENDENT_AMBULATORY_CARE_PROVIDER_SITE_OTHER): Payer: BC Managed Care – PPO

## 2013-03-26 ENCOUNTER — Ambulatory Visit (INDEPENDENT_AMBULATORY_CARE_PROVIDER_SITE_OTHER): Payer: BC Managed Care – PPO | Admitting: *Deleted

## 2013-03-26 DIAGNOSIS — I10 Essential (primary) hypertension: Secondary | ICD-10-CM

## 2013-03-26 DIAGNOSIS — Z23 Encounter for immunization: Secondary | ICD-10-CM

## 2013-03-26 DIAGNOSIS — D509 Iron deficiency anemia, unspecified: Secondary | ICD-10-CM

## 2013-03-26 LAB — CBC WITH DIFFERENTIAL/PLATELET
Basophils Relative: 0.3 % (ref 0.0–3.0)
Eosinophils Absolute: 0.2 10*3/uL (ref 0.0–0.7)
Eosinophils Relative: 1.9 % (ref 0.0–5.0)
Hemoglobin: 10.9 g/dL — ABNORMAL LOW (ref 12.0–15.0)
Lymphocytes Relative: 16.9 % (ref 12.0–46.0)
MCHC: 31.8 g/dL (ref 30.0–36.0)
Monocytes Relative: 6.9 % (ref 3.0–12.0)
Neutro Abs: 6.9 10*3/uL (ref 1.4–7.7)
Neutrophils Relative %: 74 % (ref 43.0–77.0)
RBC: 4.81 Mil/uL (ref 3.87–5.11)
WBC: 9.4 10*3/uL (ref 4.5–10.5)

## 2013-03-26 LAB — IBC PANEL
Iron: 27 ug/dL — ABNORMAL LOW (ref 42–145)
Saturation Ratios: 5.2 % — ABNORMAL LOW (ref 20.0–50.0)
Transferrin: 370.3 mg/dL — ABNORMAL HIGH (ref 212.0–360.0)

## 2013-04-01 ENCOUNTER — Telehealth: Payer: Self-pay | Admitting: *Deleted

## 2013-04-01 ENCOUNTER — Telehealth: Payer: Self-pay | Admitting: Oncology

## 2013-04-01 DIAGNOSIS — K909 Intestinal malabsorption, unspecified: Secondary | ICD-10-CM

## 2013-04-01 DIAGNOSIS — D509 Iron deficiency anemia, unspecified: Secondary | ICD-10-CM

## 2013-04-01 NOTE — Telephone Encounter (Signed)
lvom for pt and gve np appt 10/14 @ 1 w/Dr. Welton Flakes ReferrING DR.

## 2013-04-01 NOTE — Telephone Encounter (Signed)
Patient left message that she will require referral to see Dr. Park Breed as it has been over 4 years since she has seen her. Please place referral and she will await call from [facility]. Thanks!

## 2013-04-01 NOTE — Telephone Encounter (Signed)
Referral placed.

## 2013-04-06 ENCOUNTER — Telehealth: Payer: Self-pay | Admitting: Oncology

## 2013-04-06 NOTE — Telephone Encounter (Signed)
C/D 04/06/13 for appt. 04/28/13

## 2013-04-06 NOTE — Telephone Encounter (Signed)
REFERRING DR. Eustaquio Boyden

## 2013-04-19 ENCOUNTER — Ambulatory Visit: Payer: Self-pay | Admitting: Internal Medicine

## 2013-04-27 ENCOUNTER — Other Ambulatory Visit: Payer: Self-pay | Admitting: Medical Oncology

## 2013-04-27 DIAGNOSIS — D509 Iron deficiency anemia, unspecified: Secondary | ICD-10-CM

## 2013-04-28 ENCOUNTER — Encounter: Payer: Self-pay | Admitting: Oncology

## 2013-04-28 ENCOUNTER — Ambulatory Visit (HOSPITAL_BASED_OUTPATIENT_CLINIC_OR_DEPARTMENT_OTHER): Payer: BC Managed Care – PPO | Admitting: Oncology

## 2013-04-28 ENCOUNTER — Other Ambulatory Visit (HOSPITAL_BASED_OUTPATIENT_CLINIC_OR_DEPARTMENT_OTHER): Payer: BC Managed Care – PPO | Admitting: Lab

## 2013-04-28 ENCOUNTER — Telehealth: Payer: Self-pay | Admitting: *Deleted

## 2013-04-28 ENCOUNTER — Ambulatory Visit: Payer: BC Managed Care – PPO

## 2013-04-28 ENCOUNTER — Ambulatory Visit (HOSPITAL_BASED_OUTPATIENT_CLINIC_OR_DEPARTMENT_OTHER): Payer: BC Managed Care – PPO

## 2013-04-28 VITALS — BP 123/80 | HR 78

## 2013-04-28 VITALS — BP 132/88 | HR 98 | Temp 98.0°F | Resp 20 | Ht 66.0 in | Wt 233.8 lb

## 2013-04-28 DIAGNOSIS — N920 Excessive and frequent menstruation with regular cycle: Secondary | ICD-10-CM

## 2013-04-28 DIAGNOSIS — D500 Iron deficiency anemia secondary to blood loss (chronic): Secondary | ICD-10-CM

## 2013-04-28 DIAGNOSIS — D509 Iron deficiency anemia, unspecified: Secondary | ICD-10-CM

## 2013-04-28 LAB — CBC WITH DIFFERENTIAL/PLATELET
Basophils Absolute: 0 10*3/uL (ref 0.0–0.1)
Eosinophils Absolute: 0.2 10*3/uL (ref 0.0–0.5)
HCT: 36.5 % (ref 34.8–46.6)
HGB: 11.4 g/dL — ABNORMAL LOW (ref 11.6–15.9)
MCV: 71.6 fL — ABNORMAL LOW (ref 79.5–101.0)
MONO%: 8.8 % (ref 0.0–14.0)
NEUT#: 6.5 10*3/uL (ref 1.5–6.5)
NEUT%: 72.7 % (ref 38.4–76.8)
RDW: 17 % — ABNORMAL HIGH (ref 11.2–14.5)

## 2013-04-28 LAB — COMPREHENSIVE METABOLIC PANEL (CC13)
ALT: 17 U/L (ref 0–55)
Alkaline Phosphatase: 106 U/L (ref 40–150)
CO2: 25 mEq/L (ref 22–29)
Potassium: 3.9 mEq/L (ref 3.5–5.1)
Sodium: 141 mEq/L (ref 136–145)
Total Bilirubin: 0.54 mg/dL (ref 0.20–1.20)
Total Protein: 7.7 g/dL (ref 6.4–8.3)

## 2013-04-28 MED ORDER — HEPARIN SOD (PORK) LOCK FLUSH 100 UNIT/ML IV SOLN
500.0000 [IU] | Freq: Once | INTRAVENOUS | Status: DC | PRN
  Filled 2013-04-28: qty 5

## 2013-04-28 MED ORDER — SODIUM CHLORIDE 0.9 % IV SOLN
Freq: Once | INTRAVENOUS | Status: AC
  Administered 2013-04-28: 15:00:00 via INTRAVENOUS

## 2013-04-28 MED ORDER — SODIUM CHLORIDE 0.9 % IV SOLN
1020.0000 mg | Freq: Once | INTRAVENOUS | Status: AC
  Administered 2013-04-28: 1020 mg via INTRAVENOUS
  Filled 2013-04-28: qty 34

## 2013-04-28 MED ORDER — SODIUM CHLORIDE 0.9 % IJ SOLN
10.0000 mL | INTRAMUSCULAR | Status: DC | PRN
  Filled 2013-04-28: qty 10

## 2013-04-28 NOTE — Progress Notes (Signed)
[facility]  Telephone:(336) [phone] [phone]     INITIAL HEMATOLOGY CONSULTATION     Referral MD:   Dr. Eustaquio Boyden  Reason for Referral: 42 year old female with history of iron deficiency anemia and do to menorrhagia  HPI: the patient is a 42 year old very pleasant female with prior history of anemia and I had seen her previously at [facility]. Patient has had long-standing history of menorrhagia with heavy bleeding throughout. She was recently seen by her primary care physician was noted to be quite anemic with a ferritin of 5.6 total iron 27% saturation 5.2 and low. Her hemoglobin was 10.9 and hematocrit was 34.3. Because of dose long-standing anemia and iron deficiency in the setting of menorrhagia patient is seen in hematology for iron therapy parenterally.   Patient does complain of being fatigued tiredoccasionally she is dizzy. She denies having any nausea or vomiting no fevers chills or night sweats no peripheral paresthesias. Remainder of the 14 point review of systems is negative.    Past Medical History  Diagnosis Date  . Morbid obesity     Pre gastric bypass  . HTN (hypertension)     Pre gastric bypass  . DM (diabetes mellitus)     pre gastric bypass  . Arthropathy     weight bearing (pre gastric bypass)  . Oligomenorrhea     on Implanon (pre gastric bypass)  . Allergic rhinitis     post gastric bypass  . HTN (hypertension)     post gastric bypass  . GERD (gastroesophageal reflux disease)     post gastric bypass  . Pre-eclampsia     post gastric bypass  . Postpartum depression     post gastric bypass  . IDA (iron deficiency anemia)     post gastric bypass (iron infusion by hematology-doesn't build stores)  :    Past Surgical History  Procedure Laterality Date  . 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 Rectal condyloma removal  2006  . Implanon placed  03/16/09    (3 years)  . Cholecystectomy  08/2010   :   CURRENT MEDS: Current Outpatient Prescriptions  Medication Sig Dispense Refill  . Cholecalciferol (VITAMIN D) 2000 UNITS CAPS Take 1 capsule (2,000 Units total) by mouth daily.  90 capsule  3  . etonogestrel (IMPLANON) 68 MG IMPL implant Inject 1 each into the skin once.      . hydrochlorothiazide (MICROZIDE) 12.5 MG capsule Take 12.5 mg by mouth daily.      . hydrOXYzine (ATARAX/VISTARIL) 25 MG tablet Take 25 mg by mouth 3 (three) times daily as needed.      . Iron Combinations TABS Take 1 tablet by mouth daily.      Marland Kitchen loratadine (CLARITIN) 10 MG tablet Take 10 mg by mouth daily.      . Multiple Vitamin (MULTIVITAMIN) tablet Take 1 tablet by mouth daily.        . nitrofurantoin, macrocrystal-monohydrate, (MACROBID) 100 MG capsule Take 1 capsule (100 mg total) by mouth 2 (two) times daily.  14 capsule  0  . predniSONE (DELTASONE) 20 MG tablet Take 40 mg by mouth daily.      . ranitidine (ZANTAC) 150 MG tablet Take 150 mg by mouth 2 (two) times daily.       No current facility-administered medications for this visit.      No Known Allergies:  Family History  Problem Relation Age of Onset  . Coronary artery disease  Father     2 stents; + smoker  . Hypertension Father   . Thyroid disease Brother   . Diabetes Maternal Grandmother   . Cirrhosis Paternal Grandmother   . Alcohol abuse Paternal Grandmother   . Breast cancer      Paternal great aunt(obesity, smoker, EtOHic)  . Breast cancer      Maternal great aunt (deceased in 4's from same)  . Thyroid disease Other     cousins and grandmother  . Cancer Other     great aunt - lung  . Liver disease Paternal Aunt   :  History   Social History  . Marital Status: Married    Spouse Name: N/A    Number of Children: 2  . 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 Smoking status: Never Smoker   . Smokeless tobacco: Never Used  . Alcohol Use: Yes      Comment: occasional  . Drug Use: No  . Sexual Activity: Yes    Partners: Male    Birth Control/ Protection: None   Other Topics Concern  . Not on file   Social History Narrative   Lives with parents, husband, 2 children and small dog   Currently in school getting bachelor's in psychology, Also working full time in admin at [employer]   Masters in theology and counseling.  Wants to be substance abuse counselor   Activity: no regular exercise   Diet: good water, fruits/vegetables daily, does like juice  :  REVIEW OF SYSTEM:  The rest of the 14-point review of sytem was negative.   Exam: Filed Vitals:   04/28/13 1324  BP: 132/88  Pulse: 98  Temp: 98 F (36.7 C)  TempSrc: Oral  Resp: 20  Height: 5\' 6"  (1.676 m)  Weight: 233 lb 12.8 oz (106.051 kg)     General:  well-nourished in no acute distress.  Eyes:  no scleral icterus.  ENT:  There were no oropharyngeal lesions.  Neck was without thyromegaly.  Lymphatics:  Negative cervical, supraclavicular or axillary adenopathy.  Respiratory: lungs were clear bilaterally without wheezing or crackles.  Cardiovascular:  Regular rate and rhythm, S1/S2, without murmur, rub or gallop.  There was no pedal edema.  GI:  abdomen was soft, flat, nontender, nondistended, without organomegaly.  Muscoloskeletal:  no spinal tenderness of palpation of vertebral spine.  Skin exam was without echymosis, petichae.  Neuro exam was nonfocal.  Patient was able to get on and off exam table without assistance.  Gait was normal.  Patient was alerted and oriented.  Attention was good.   Language was appropriate.  Mood was normal without depression.  Speech was not pressured.  Thought content was not tangential.    LABS:  Lab Results  Component Value Date   WBC 9.0 04/28/2013   HGB 11.4* 04/28/2013   HCT 36.5 04/28/2013   PLT 371 04/28/2013   GLUCOSE 103* 06/06/2012   CHOL 195 09/26/2009   TRIG 88 09/26/2009   HDL 58 09/26/2009   LDLCALC 119 09/26/2009   ALT 13  06/06/2012   AST 22 06/06/2012   NA 137 06/06/2012   K 3.8 06/06/2012   CL 103 06/06/2012   CREATININE 0.8 06/06/2012   BUN 10 06/06/2012   CO2 27 06/06/2012    No results found.  Blood smear review:   I personally reviewed the patient's peripheral blood smear today.  There was  isocytosis.  There was no peripheral blast.  There was no schistocytosis, spherocytosis, target cell, rouleaux formation, tear drop cell.  There was no giant platelets or platelet clumps.  She is noted to have microcytosis    ASSESSMENT AND PLAN: 42 year old female with long-standing history of menorrhagia leading to iron deficiency. Patient has hypoferetinemia. She is taking oral iron her ferritin remains very low. We did check her iron studies today. But based on her PCPs iron studies I do think she should received parenteral iron to build up her stores. She and I discussed use of parenteral iron with use of feraheme. I discussed the risks benefits and side effects of it. She has received parenteral iron in the past when I was at [facility]. She understands the rationale. She would like to proceed with this. We will plan on doing this today.  I will plan on seeing her back in about 3-6 months time for followup with iron studies.  The length of time of the face-to-face encounter was 30    minutes. More than 50% of time was spent counseling and coordination of care.  Drue Second, MD Medical/Oncology [facility] [phone] (beeper) [phone] (Office)

## 2013-04-28 NOTE — Telephone Encounter (Signed)
Per staff message and POF I have scheduled appts.  JMW  

## 2013-04-28 NOTE — Telephone Encounter (Signed)
appts made and printed. Pt is aware that tx will follow after her ov. i emailed MW to add the tx...td

## 2013-04-28 NOTE — Patient Instructions (Signed)
Ferumoxytol injection What is this medicine? FERUMOXYTOL is an iron complex. Iron is used to make healthy red blood cells, which carry oxygen and nutrients throughout the body. This medicine is used to treat iron deficiency anemia in people with chronic kidney disease. This medicine may be used for other purposes; ask your health care provider or pharmacist if you have questions. What should I tell my health care provider before I take this medicine? They need to know if you have any of these conditions: -anemia not caused by low iron levels -high levels of iron in the blood -magnetic resonance imaging (MRI) test scheduled -an unusual or allergic reaction to iron, other medicines, foods, dyes, or preservatives -pregnant or trying to get pregnant -breast-feeding How should I use this medicine? This medicine is for infusion into a vein. It is given by a health care professional in a hospital or clinic setting. Talk to your pediatrician regarding the use of this medicine in children. Special care may be needed. Overdosage: If you think you've taken too much of this medicine contact a poison control center or emergency room at once. Overdosage: If you think you have taken too much of this medicine contact a poison control center or emergency room at once. NOTE: This medicine is only for you. Do not share this medicine with others. What if I miss a dose? It is important not to miss your dose. Call your doctor or health care professional if you are unable to keep an appointment. What may interact with this medicine? This medicine may interact with the following medications: -other iron products This list may not describe all possible interactions. Give your health care provider a list of all the medicines, herbs, non-prescription drugs, or dietary supplements you use. Also tell them if you smoke, drink alcohol, or use illegal drugs. Some items may interact with your medicine. What should I watch  for while using this medicine? Visit your doctor or healthcare professional regularly. Tell your doctor or healthcare professional if your symptoms do not start to get better or if they get worse. You may need blood work done while you are taking this medicine. You may need to follow a special diet. Talk to your doctor. Foods that contain iron include: whole grains/cereals, dried fruits, beans, or peas, leafy green vegetables, and organ meats (liver, kidney). What side effects may I notice from receiving this medicine? Side effects that you should report to your doctor or health care professional as soon as possible: -allergic reactions like skin rash, itching or hives, swelling of the face, lips, or tongue -breathing problems -changes in blood pressure -feeling faint or lightheaded, falls -fever or chills -flushing, sweating, or hot feelings -swelling of the ankles or feet Side effects that usually do not require medical attention (Report these to your doctor or health care professional if they continue or are bothersome.): -diarrhea -headache -nausea, vomiting -stomach pain This list may not describe all possible side effects. Call your doctor for medical advice about side effects. You may report side effects to FDA at 1-800-FDA-1088. Where should I keep my medicine? This drug is given in a hospital or clinic and will not be stored at home. NOTE: This sheet is a summary. It may not cover all possible information. If you have questions about this medicine, talk to your doctor, pharmacist, or health care provider.  2012, Elsevier/Gold Standard. (03/24/2008 9:48:25 PM) 

## 2013-04-29 LAB — IRON AND TIBC CHCC
%SAT: 16 % — ABNORMAL LOW (ref 21–57)
Iron: 72 ug/dL (ref 41–142)
TIBC: 465 ug/dL — ABNORMAL HIGH (ref 236–444)

## 2013-07-13 ENCOUNTER — Encounter: Payer: Self-pay | Admitting: Family Medicine

## 2013-07-13 ENCOUNTER — Ambulatory Visit (INDEPENDENT_AMBULATORY_CARE_PROVIDER_SITE_OTHER): Payer: BC Managed Care – PPO | Admitting: Family Medicine

## 2013-07-13 VITALS — BP 118/78 | HR 86 | Temp 98.2°F | Wt 231.5 lb

## 2013-07-13 DIAGNOSIS — J019 Acute sinusitis, unspecified: Secondary | ICD-10-CM

## 2013-07-13 MED ORDER — AMOXICILLIN-POT CLAVULANATE 875-125 MG PO TABS: 1.0000 | ORAL_TABLET | Freq: Two times a day (BID) | ORAL | Status: DC

## 2013-07-13 NOTE — Progress Notes (Signed)
   Subjective:    Patient ID: Theresa Pratt, female    DOB: [birth year], 42 y.o.   MRN: [id number]  HPI CC: sinusitis?  Several month h/o headache, cough, phlegm in morning, + facial pain in frontal sinuses.  Feverish and chills - but also associated with hot flashes.  L lower tooth hurts - same tooth as root canal and will see dentist to evaluate this.  +ST, PNdrainage.  Staying congested.  No ear pain.  No abd pain or nausea.  Has been using mucinex and alka seltzer plus.  Son sick at home. No h/o asthma.  Mild allergies in past. No smokers at home.  Past Medical History  Diagnosis Date  . Morbid obesity     Pre gastric bypass  . HTN (hypertension)     Pre gastric bypass  . DM (diabetes mellitus)     pre gastric bypass  . Arthropathy     weight bearing (pre gastric bypass)  . Oligomenorrhea     on Implanon (pre gastric bypass)  . Allergic rhinitis     post gastric bypass  . HTN (hypertension)     post gastric bypass  . GERD (gastroesophageal reflux disease)     post gastric bypass  . Pre-eclampsia     post gastric bypass  . Postpartum depression     post gastric bypass  . IDA (iron deficiency anemia)     post gastric bypass (iron infusion by hematology-doesn't build stores)     Review of Systems Per HPI    Objective:   Physical Exam  Nursing note and vitals reviewed. Constitutional: She appears well-developed and well-nourished. No distress.  HENT:  Head: Normocephalic and atraumatic.  Right Ear: Hearing, tympanic membrane, external ear and ear canal normal.  Left Ear: Hearing, tympanic membrane, external ear and ear canal normal.  Nose: Mucosal edema present. No rhinorrhea. Right sinus exhibits no maxillary sinus tenderness and no frontal sinus tenderness. Left sinus exhibits no maxillary sinus tenderness and no frontal sinus tenderness.  Mouth/Throat: Uvula is midline and mucous membranes are normal. Posterior oropharyngeal edema present. No  oropharyngeal exudate, posterior oropharyngeal erythema or tonsillar abscesses.  Nasal turbinate swelling  Eyes: Conjunctivae and EOM are normal. Pupils are equal, round, and reactive to light. No scleral icterus.  Neck: Normal range of motion. Neck supple.  Cardiovascular: Normal rate, regular rhythm, normal heart sounds and intact distal pulses.   No murmur heard. Pulmonary/Chest: Effort normal and breath sounds normal. No respiratory distress. She has no wheezes. She has no rales.  Clear lungs  Lymphadenopathy:    She has no cervical adenopathy.  Skin: Skin is warm and dry. No rash noted.       Assessment & Plan:

## 2013-07-13 NOTE — Assessment & Plan Note (Signed)
Given duration anticipate prolonged subacute sinusitis. Will treat with augmentin course. Supportive care as per instructions.

## 2013-07-13 NOTE — Patient Instructions (Signed)
You have a sinus infection. Take medicine as prescribed: augmentin twice daily for 10 days. Finish full course of antibiotics. Push fluids and plenty of rest. Nasal saline irrigation or neti pot to help drain sinuses. May use simple mucinex with plenty of fluid to help mobilize mucous. Let us know if fever >101.5, trouble opening/closing mouth, difficulty swallowing, or worsening - you may need to be seen again.

## 2013-07-13 NOTE — Addendum Note (Signed)
Addended by: Josph Macho A on: 07/13/2013 03:18 PM   Modules accepted: Orders

## 2013-08-10 ENCOUNTER — Ambulatory Visit: Payer: Self-pay | Admitting: Physician Assistant

## 2013-09-21 ENCOUNTER — Telehealth: Payer: Self-pay | Admitting: Oncology

## 2013-09-21 ENCOUNTER — Encounter: Payer: Self-pay | Admitting: Oncology

## 2013-09-21 ENCOUNTER — Other Ambulatory Visit (HOSPITAL_BASED_OUTPATIENT_CLINIC_OR_DEPARTMENT_OTHER): Payer: BC Managed Care – PPO

## 2013-09-21 ENCOUNTER — Ambulatory Visit (HOSPITAL_BASED_OUTPATIENT_CLINIC_OR_DEPARTMENT_OTHER): Payer: BC Managed Care – PPO | Admitting: Oncology

## 2013-09-21 ENCOUNTER — Ambulatory Visit: Payer: BC Managed Care – PPO

## 2013-09-21 VITALS — BP 142/101 | HR 78 | Temp 97.9°F | Resp 18 | Ht 66.0 in | Wt 233.7 lb

## 2013-09-21 DIAGNOSIS — N920 Excessive and frequent menstruation with regular cycle: Secondary | ICD-10-CM

## 2013-09-21 DIAGNOSIS — D509 Iron deficiency anemia, unspecified: Secondary | ICD-10-CM

## 2013-09-21 DIAGNOSIS — D500 Iron deficiency anemia secondary to blood loss (chronic): Secondary | ICD-10-CM

## 2013-09-21 LAB — CBC WITH DIFFERENTIAL/PLATELET
BASO%: 0.3 % (ref 0.0–2.0)
Basophils Absolute: 0 10*3/uL (ref 0.0–0.1)
EOS%: 1.8 % (ref 0.0–7.0)
Eosinophils Absolute: 0.2 10*3/uL (ref 0.0–0.5)
HEMATOCRIT: 42.6 % (ref 34.8–46.6)
HGB: 13.9 g/dL (ref 11.6–15.9)
LYMPH%: 14.2 % (ref 14.0–49.7)
MCH: 28.7 pg (ref 25.1–34.0)
MCHC: 32.7 g/dL (ref 31.5–36.0)
MCV: 87.7 fL (ref 79.5–101.0)
MONO#: 0.8 10*3/uL (ref 0.1–0.9)
MONO%: 7.6 % (ref 0.0–14.0)
NEUT#: 7.5 10*3/uL — ABNORMAL HIGH (ref 1.5–6.5)
NEUT%: 76.1 % (ref 38.4–76.8)
PLATELETS: 331 10*3/uL (ref 145–400)
RBC: 4.86 10*6/uL (ref 3.70–5.45)
RDW: 13.3 % (ref 11.2–14.5)
WBC: 9.9 10*3/uL (ref 3.9–10.3)
lymph#: 1.4 10*3/uL (ref 0.9–3.3)

## 2013-09-21 LAB — IRON AND TIBC CHCC
%SAT: 33 % (ref 21–57)
IRON: 102 ug/dL (ref 41–142)
TIBC: 311 ug/dL (ref 236–444)
UIBC: 209 ug/dL (ref 120–384)

## 2013-09-21 LAB — FERRITIN CHCC: Ferritin: 62 ng/ml (ref 9–269)

## 2013-09-21 NOTE — Progress Notes (Signed)
OFFICE PROGRESS NOTE  CC  Theresa Bush, MD [place] [address]  DIAGNOSIS: 43 year old female with iron deficiency anemia secondary to menorrhagia  PRIOR THERAPY:  #1 patient is status post feraheme infusion October 2014  #2 history of gastric bypass surgery   CURRENT THERAPY: Observation   INTERVAL HISTORY: Theresa Pratt 43 y.o. female returns for followup visit today. Clinically she seems to be doing well. She tells me that she is now using birth control to help prevent excessive heavy menstrual periods. She has not had a period in about 6 months or so. She is denying any headaches double vision blurring of vision Today she denies any headaches double vision blurring of vision fevers chills night sweats. No shortness of breath chest pains palpitations. No abdominal pain no diarrhea or constipation. She has no easy bruising or bleeding. She has no myalgias and arthralgias. No peripheral paresthesias or gait disturbances. Remainder of the 10 point review of systems is negative.   MEDICAL HISTORY: Past Medical History  Diagnosis Date  . Morbid obesity     Pre gastric bypass  . HTN (hypertension)     Pre gastric bypass  . DM (diabetes mellitus)     pre gastric bypass  . Arthropathy     weight bearing (pre gastric bypass)  . Oligomenorrhea     on Implanon (pre gastric bypass)  . Allergic rhinitis     post gastric bypass  . HTN (hypertension)     post gastric bypass  . GERD (gastroesophageal reflux disease)     post gastric bypass  . Pre-eclampsia     post gastric bypass  . Postpartum depression     post gastric bypass  . IDA (iron deficiency anemia)     post gastric bypass (iron infusion by hematology-doesn't build stores)    ALLERGIES:  has No Known Allergies.  MEDICATIONS:  Current Outpatient Prescriptions  Medication Sig Dispense Refill  . Iron Combinations TABS Take 1 tablet by mouth daily.      Marland Kitchen levonorgestrel (MIRENA) 20  MCG/24HR IUD 1 each by Intrauterine route once.      . Multiple Vitamin (MULTIVITAMIN) tablet Take 1 tablet by mouth daily.        . ranitidine (ZANTAC) 150 MG tablet Take 150 mg by mouth 2 (two) times daily.      Marland Kitchen amoxicillin-clavulanate (AUGMENTIN) 875-125 MG per tablet Take 1 tablet by mouth 2 (two) times daily.  20 tablet  0  . Cholecalciferol (VITAMIN D) 2000 UNITS CAPS Take 1 capsule (2,000 Units total) by mouth daily.  90 capsule  3  . etonogestrel (IMPLANON) 68 MG IMPL implant Inject 1 each into the skin once.      . hydrochlorothiazide (MICROZIDE) 12.5 MG capsule Take 12.5 mg by mouth daily.      . hydrOXYzine (ATARAX/VISTARIL) 25 MG tablet Take 25 mg by mouth 3 (three) times daily as needed.      . loratadine (CLARITIN) 10 MG tablet Take 10 mg by mouth daily.      . nitrofurantoin, macrocrystal-monohydrate, (MACROBID) 100 MG capsule Take 1 capsule (100 mg total) by mouth 2 (two) times daily.  14 capsule  0  . predniSONE (DELTASONE) 20 MG tablet Take 40 mg by mouth daily.       No current facility-administered medications for this visit.    SURGICAL HISTORY:  Past Surgical History  Procedure Laterality Date  . 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  Rectal condyloma removal  2006  . Implanon placed  03/16/09    (3 years)  . Cholecystectomy  08/2010    REVIEW OF SYSTEMS:  Pertinent items are noted in HPI.     PHYSICAL EXAMINATION: Blood pressure 142/101, pulse 78, temperature 97.9 F (36.6 C), temperature source Oral, resp. rate 18, height 5\' 6"  (1.676 m), weight 233 lb 11.2 oz (106.006 kg). Body mass index is 37.74 kg/(m^2). ECOG PERFORMANCE STATUS: 0 - Asymptomatic  Well-developed nourished female in no acute distress HEENT exam: EOMI PERRLA sclerae anicteric no conjunctival pallor oral mucosa is moist neck supple no palpable cervical supraclavicular or axillary adenopathy Lungs: Clear to auscultation and percussion Cardiovascular: Regular rate rhythm no murmurs  gallops or rubs Abdomen: Soft nontender nondistended bowel sounds are present no hepatosplenomegaly or palpable masses Extremities: No edema clubbing or cyanosis, pedal pulses are present Neuro: Alert oriented x3 DTRs +4 strength is symmetrical in upper and lower extremities, gait normal, but is grossly normal Skin: Warm and moist, good capillary filling, no rashes.     LABORATORY DATA: Lab Results  Component Value Date   WBC 9.9 09/21/2013   HGB 13.9 09/21/2013   HCT 42.6 09/21/2013   MCV 87.7 09/21/2013   PLT 331 09/21/2013      Chemistry      Component Value Date/Time   NA 141 04/28/2013 1309   NA 137 06/06/2012 0822   K 3.9 04/28/2013 1309   K 3.8 06/06/2012 0822   CL 103 06/06/2012 0822   CO2 25 04/28/2013 1309   CO2 27 06/06/2012 0822   BUN 10.9 04/28/2013 1309   BUN 10 06/06/2012 0822   CREATININE 0.8 04/28/2013 1309   CREATININE 0.8 06/06/2012 0822      Component Value Date/Time   CALCIUM 8.8 04/28/2013 1309   CALCIUM 8.6 06/06/2012 0822   ALKPHOS 106 04/28/2013 1309   ALKPHOS 87 06/06/2012 0822   AST 20 04/28/2013 1309   AST 22 06/06/2012 0822   ALT 17 04/28/2013 1309   ALT 13 06/06/2012 0822   BILITOT 0.54 04/28/2013 1309   BILITOT 0.3 06/06/2012 0822       RADIOGRAPHIC STUDIES:  No results found.  ASSESSMENT/PLAN: 43 year old female with  #1 history of iron deficiency anemia secondary to heavy menstrual cycles now resolved. Her hemoglobin hematocrit look terrific. Her last iron infusion was in October 2014. We did check her iron studies today results of which are pending. I will call her with the results.  #2 patient will be seen back in one year for followup. She wants to continue to be seen here on an annual basis.  All questions were answered. The patient knows to call the clinic with any problems, questions or concerns. We can certainly see the patient much sooner if necessary.  I spent 10 minutes counseling the patient face to face. The total time  spent in the appointment was 15 minutes.    Marcy Panning, MD Medical/Oncology [facility] [phone] (beeper) [phone] (Office)  09/21/2013, 12:55 PM

## 2013-09-21 NOTE — Patient Instructions (Signed)
Iron Deficiency Anemia, Adult  Anemia is a condition in which there are less red blood cells or hemoglobin in the blood than normal. Hemoglobin is this part of red blood cells that carries oxygen. Iron deficiency anemia is anemia caused by too little iron. It is the most common type of anemia. It may leave you tired and short of breath.  CAUSES    Lack of iron in the diet.   Poor absorption of iron, as seen with intestinal disorders.   Intestinal bleeding.   Heavy periods.  SIGNS AND SYMPTOMS   Mild anemia may not be noticeable. Symptoms may include:   Fatigue.   Headache.   Pale skin.   Weakness.   Tiredness.   Shortness of breath.   Dizziness.   Cold hands and feet.   Fast or irregular heartbeat.  DIAGNOSIS   Diagnosis requires a thorough evaluation and physical exam by your health care provider. Blood tests are generally used to confirm iron deficiency anemia. Additional tests may be done to find the underlying cause of your anemia. These may include:   Testing for blood in the stool (fecal occult blood test).   A procedure to see inside the colon and rectum (colonoscopy).   A procedure to see inside the esophagus and stomach (endoscopy).  TREATMENT   Iron deficiency anemia is treated by correcting the cause of the deficiency. Treatment may involve:   Adding iron-rich foods to your diet.   Taking iron supplements. Pregnant or breastfeeding women need to take extra iron, because their normal diet usually does not provide the required amount.   Taking vitamins. Vitamin C improves the absorption of iron. Your health care provider may recommend taking your iron tablets with a glass of orange juice or vitamin C supplement.   Medicines to make heavy menstrual flow lighter.   Surgery.  HOME CARE INSTRUCTIONS    Take iron as directed by your health care provider.   If you cannot tolerate taking iron supplements by mouth, talk to your health care provider about taking them through a vein  (intravenously) or an injection into a muscle.   For the best iron absorption, iron supplements should be taken on an empty stomach. If you cannot tolerate them on an empty stomach, you may need to take them with food.   Do not drink milk or take antacids at the same time as your iron supplements. Milk and antacids may interfere with the absorption of iron.   Iron supplements can cause constipation. Make sure to include fiber in your diet to prevent constipation. A stool softener may also be recommended.   Take vitamins as directed by your health care provider.   Eat a diet rich in iron. Foods high in iron include liver, lean beef, whole-grain bread, eggs, dried fruit, and dark green, leafy vegetables.  SEEK IMMEDIATE MEDICAL CARE IF:    You faint. If this happens, do not drive. Call your local emergency services (911 in [place] if no other help is available.   You have chest pain.   You feel nauseous or vomit.   You have severe or increased shortness of breath with activity.   You feel weak.   You have a rapid heartbeat.   You have unexplained sweating.   You become lightheaded when getting up from a chair or bed.  MAKE SURE YOU:    Understand these instructions.   Will watch your condition.   Will get help right away if you are not 

## 2013-09-22 ENCOUNTER — Telehealth: Payer: Self-pay | Admitting: *Deleted

## 2013-09-22 ENCOUNTER — Encounter: Payer: Self-pay | Admitting: Family Medicine

## 2013-09-22 NOTE — Telephone Encounter (Signed)
As noted below by Dr. Humphrey Rolls, I informed patient that iron studies were normal. Patient verbalized understanding and has already made appointments for next year.

## 2013-09-22 NOTE — Telephone Encounter (Signed)
Message copied by Hebert Soho on Tue Sep 22, 2013  3:35 PM ------      Message from: Deatra Robinson      Created: Tue Sep 22, 2013 11:53 AM       Iron studies normal            We will see you back in 1 year ------

## 2013-09-30 ENCOUNTER — Ambulatory Visit: Payer: BC Managed Care – PPO | Admitting: Family Medicine

## 2013-09-30 DIAGNOSIS — Z0289 Encounter for other administrative examinations: Secondary | ICD-10-CM

## 2013-10-03 ENCOUNTER — Ambulatory Visit: Payer: Self-pay | Admitting: Physician Assistant

## 2013-10-17 ENCOUNTER — Ambulatory Visit: Payer: Self-pay | Admitting: Physician Assistant

## 2013-10-19 ENCOUNTER — Ambulatory Visit (INDEPENDENT_AMBULATORY_CARE_PROVIDER_SITE_OTHER): Payer: BC Managed Care – PPO | Admitting: Family Medicine

## 2013-10-19 ENCOUNTER — Encounter: Payer: Self-pay | Admitting: Family Medicine

## 2013-10-19 VITALS — BP 126/78 | HR 87 | Temp 97.7°F | Wt 228.5 lb

## 2013-10-19 DIAGNOSIS — J019 Acute sinusitis, unspecified: Secondary | ICD-10-CM

## 2013-10-19 DIAGNOSIS — I10 Essential (primary) hypertension: Secondary | ICD-10-CM

## 2013-10-19 MED ORDER — HYDROCHLOROTHIAZIDE 12.5 MG PO TABS: 12.5000 mg | ORAL_TABLET | Freq: Every day | ORAL | Status: DC

## 2013-10-19 NOTE — Patient Instructions (Signed)
Finish doxycycline course.  Restart immediate release guaifenesin or plain mucinex with plenty of fluid to help mobilize mucous. Push fluids and rest. Let me know if not improving. Good to see you today, call us with questions. Return at your convenience for fasting blood work and afterwards for a physical.

## 2013-10-19 NOTE — Progress Notes (Signed)
BP 126/78  Pulse 87  Temp(Src) 97.7 F (36.5 C) (Oral)  Wt 228 lb 8 oz (103.647 kg)  SpO2 98%   CC: med refill  Subjective:    Patient ID: Theresa Pratt, female    DOB: 1970-08-20, 43 y.o.   MRN: [id number]  HPI: LAKEASHA PETION is a 43 y.o. female presenting on 10/19/2013 for Medication Refill   Recently seen at [facility] with sinus infection, treated with doxycycline.  sxs started Thursday with fever, chills, sweats, and HA, weakness/fatigue, ST and sinus congestion.  + productive cough.  3rd sinus infection since winter.  First 12/29 treated here with augmentin 10d course and fully improved.  Then Feb had 2nd sinus infection seen at [facility], unable to finish doxy course so changed back to augmentin and sxs fully resolved.  Now last week seen again at [facility], treated with doxy 10d course - see above.  Requests refill of hctz 12.5mg .  compliant with hctz 12.5mg  daily. BP Readings from Last 3 Encounters:  10/19/13 126/78  09/21/13 142/101  07/13/13 118/78   Wt Readings from Last 3 Encounters:  10/19/13 228 lb 8 oz (103.647 kg)  09/21/13 233 lb 11.2 oz (106.006 kg)  07/13/13 231 lb 8 oz (105.008 kg)    Relevant past medical, surgical, family and social history reviewed and updated as indicated.  Allergies and medications reviewed and updated. Current Outpatient Prescriptions on File Prior to Visit  Medication Sig  . Iron Combinations TABS Take 1 tablet by mouth daily.  Marland Kitchen levonorgestrel (MIRENA) 20 MCG/24HR IUD 1 each by Intrauterine route once.  . Multiple Vitamin (MULTIVITAMIN) tablet Take 1 tablet by mouth daily.    . ranitidine (ZANTAC) 150 MG tablet Take 150 mg by mouth 2 (two) times daily.   No current facility-administered medications on file prior to visit.    Review of Systems Per HPI unless specifically indicated above    Objective:    BP 126/78  Pulse 87  Temp(Src) 97.7 F (36.5 C) (Oral)  Wt 228 lb 8 oz (103.647 kg)  SpO2 98%  Physical Exam    Nursing note and vitals reviewed. Constitutional: She appears well-developed and well-nourished. No distress.  HENT:  Head: Normocephalic and atraumatic.  Right Ear: Hearing, tympanic membrane, external ear and ear canal normal.  Left Ear: Hearing, tympanic membrane, external ear and ear canal normal.  Nose: No mucosal edema or rhinorrhea. Right sinus exhibits no maxillary sinus tenderness and no frontal sinus tenderness. Left sinus exhibits no maxillary sinus tenderness and no frontal sinus tenderness.  Mouth/Throat: Uvula is midline, oropharynx is clear and moist and mucous membranes are normal. No oropharyngeal exudate, posterior oropharyngeal edema, posterior oropharyngeal erythema or tonsillar abscesses.  Eyes: Conjunctivae and EOM are normal. Pupils are equal, round, and reactive to light. No scleral icterus.  Neck: Normal range of motion. Neck supple.  Cardiovascular: Normal rate, regular rhythm, normal heart sounds and intact distal pulses.   No murmur heard. Pulmonary/Chest: Effort normal and breath sounds normal. No respiratory distress. She has no wheezes. She has no rales.  Lymphadenopathy:    She has no cervical adenopathy.  Skin: Skin is warm and dry. No rash noted.       Assessment & Plan:   Problem List Items Addressed This Visit   Unspecified essential hypertension     hctz refilled today.    Relevant Medications      hydrochlorothiazide (HYDRODIURIL) tablet   Acute sinusitis - Primary     Finish  doxycycline.and start guaifenesin with fluids. See pt instructions for plan. Update me if sxs persist.        Follow up plan: Return if symptoms worsen or fail to improve.

## 2013-10-19 NOTE — Progress Notes (Signed)
Pre visit review using our clinic review tool, if applicable. No additional management support is needed unless otherwise documented below in the visit note. 

## 2013-10-19 NOTE — Assessment & Plan Note (Signed)
hctz refilled today.

## 2013-10-19 NOTE — Assessment & Plan Note (Signed)
Finish doxycycline.and start guaifenesin with fluids. See pt instructions for plan. Update me if sxs persist.

## 2013-10-20 ENCOUNTER — Telehealth: Payer: Self-pay | Admitting: Family Medicine

## 2013-10-20 NOTE — Telephone Encounter (Signed)
Relevant patient education assigned to patient using Emmi. ° °

## 2013-11-22 ENCOUNTER — Other Ambulatory Visit: Payer: Self-pay | Admitting: Family Medicine

## 2013-11-22 DIAGNOSIS — E559 Vitamin D deficiency, unspecified: Secondary | ICD-10-CM

## 2013-11-22 DIAGNOSIS — I10 Essential (primary) hypertension: Secondary | ICD-10-CM

## 2013-11-22 DIAGNOSIS — D509 Iron deficiency anemia, unspecified: Secondary | ICD-10-CM

## 2013-11-22 DIAGNOSIS — K909 Intestinal malabsorption, unspecified: Secondary | ICD-10-CM

## 2013-11-27 ENCOUNTER — Other Ambulatory Visit: Payer: BC Managed Care – PPO

## 2013-12-04 ENCOUNTER — Encounter: Payer: BC Managed Care – PPO | Admitting: Family Medicine

## 2013-12-04 DIAGNOSIS — Z0289 Encounter for other administrative examinations: Secondary | ICD-10-CM

## 2014-04-27 ENCOUNTER — Ambulatory Visit (INDEPENDENT_AMBULATORY_CARE_PROVIDER_SITE_OTHER): Payer: BC Managed Care – PPO

## 2014-04-27 DIAGNOSIS — Z23 Encounter for immunization: Secondary | ICD-10-CM

## 2014-04-28 ENCOUNTER — Ambulatory Visit (INDEPENDENT_AMBULATORY_CARE_PROVIDER_SITE_OTHER): Payer: BC Managed Care – PPO | Admitting: Family Medicine

## 2014-04-28 ENCOUNTER — Encounter: Payer: Self-pay | Admitting: Family Medicine

## 2014-04-28 VITALS — BP 136/96 | HR 72 | Temp 97.8°F | Wt 235.8 lb

## 2014-04-28 DIAGNOSIS — G8929 Other chronic pain: Secondary | ICD-10-CM | POA: Insufficient documentation

## 2014-04-28 DIAGNOSIS — Z566 Other physical and mental strain related to work: Secondary | ICD-10-CM | POA: Insufficient documentation

## 2014-04-28 DIAGNOSIS — M7521 Bicipital tendinitis, right shoulder: Secondary | ICD-10-CM | POA: Insufficient documentation

## 2014-04-28 DIAGNOSIS — R002 Palpitations: Secondary | ICD-10-CM

## 2014-04-28 DIAGNOSIS — M898X1 Other specified disorders of bone, shoulder: Secondary | ICD-10-CM

## 2014-04-28 MED ORDER — GABAPENTIN 100 MG PO CAPS: 100.0000 mg | ORAL_CAPSULE | Freq: Every day | ORAL | Status: DC

## 2014-04-28 NOTE — Progress Notes (Signed)
BP 136/96  Pulse 72  Temp(Src) 97.8 F (36.6 C) (Oral)  Wt 235 lb 12 oz (106.935 kg)   CC: discuss stressors and other issues Subjective:    Patient ID: Theresa Pratt, female    DOB: 12-02-1970, 43 y.o.   MRN: [id number]  HPI: Theresa Pratt is a 43 y.o. female presenting on 04/28/2014 for Stress   Increased work stress at work over last 6 months. 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 Noticing increase in heart palpitations with this described as rapid heart beats.   Also notes R posterior shoulder blade pain intermittently since shingles at that site 2 yrs ago. Describes nagging ache, not burning pain. Amitriptyline did help but caused sedation. Tylenol also helps pain.   H/o IDA s/p iron infusion and now levels normal last check (09/2013)  Currently in school getting bachelor's in psychology, Also working full time in [place] at [employer]. Masters in theology and counseling. Wants to be substance abuse counselor.   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  Relevant past medical, surgical, family and social history reviewed and updated as indicated.  Allergies and medications reviewed and updated. Current Outpatient Prescriptions on File Prior to Visit  Medication Sig  . hydrochlorothiazide (HYDRODIURIL) 12.5 MG tablet Take 1 tablet (12.5 mg total) by mouth daily.  . Iron Combinations TABS Take 1 tablet by mouth daily.  Marland Kitchen levonorgestrel (MIRENA) 20 MCG/24HR IUD 1 each by Intrauterine route once.  . Multiple Vitamin (MULTIVITAMIN) tablet Take 1 tablet by mouth daily.    . ranitidine (ZANTAC) 150 MG tablet Take 150 mg by mouth 2 (two) times daily.   No current facility-administered medications on file prior to visit.   Past Medical History  Diagnosis Date  . Morbid obesity     Pre gastric bypass  . HTN (hypertension)     Pre gastric bypass  . DM (diabetes mellitus)     pre gastric bypass  . Arthropathy     weight bearing (pre gastric bypass)  .  Oligomenorrhea     on Implanon (pre gastric bypass)  . Allergic rhinitis     post gastric bypass  . HTN (hypertension)     post gastric bypass  . GERD (gastroesophageal reflux disease)     post gastric bypass  . Pre-eclampsia     post gastric bypass  . Postpartum depression     post gastric bypass  . IDA (iron deficiency anemia)     post gastric bypass (iron infusion by hematology-doesn't build stores -Dr. Humphrey Rolls)   Past Surgical History  Procedure Laterality Date  . 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 Rectal condyloma removal  2006  . Implanon placed  03/16/09    (3 years)  . Cholecystectomy  08/2010    Review of Systems Per HPI unless specifically indicated above    Objective:    BP 136/96  Pulse 72  Temp(Src) 97.8 F (36.6 C) (Oral)  Wt 235 lb 12 oz (106.935 kg)  Physical Exam  Nursing note and vitals reviewed. Constitutional: She appears well-developed and well-nourished. No distress.  HENT:  Mouth/Throat: Oropharynx is clear and moist. No oropharyngeal exudate.  obese  Cardiovascular: Normal rate, regular rhythm, normal heart sounds and intact distal pulses.   No murmur heard. Pulmonary/Chest: Effort normal and breath sounds normal. No respiratory distress. She has no wheezes. She has no rales.  Musculoskeletal: She exhibits no edema.  Tender to palpation along R biceps  tendon, + yerguson and speed test. Reproducible tenderness to palpation over R scapula, no midline discomfort or rhomboid pain  Skin: Skin is warm and dry. No rash noted.  Psychiatric: She has a normal mood and affect.       Assessment & Plan:   Problem List Items Addressed This Visit   Work-related stress     Discussed healthy stress relieving strategies.  EKG today - NSR rate 70, normal axis, intervals, no acute ST/T changes    Chronic scapular pain     Anticipate post-herpetic neuralgia as started after shingles and elavil helped in past, but was too sedating. Will trial  gabapentin, start at 100mg  nightly, discussed slow titration.    Biceps tendonitis on right     Likely from awkward sleeping positioning - currently sleeping in small bed with husband and 5yo child, sleeps with arms over her head. Treat with aleve prn, ice, and provided with exercises from SM pt advisor on biceps tendonitis     Other Visit Diagnoses   Palpitations    -  Primary    Relevant Orders       EKG 12-Lead (Completed)        Follow up plan: Return if symptoms worsen or fail to improve, for annual exam.

## 2014-04-28 NOTE — Assessment & Plan Note (Signed)
Discussed healthy stress relieving strategies.  EKG today - NSR rate 70, normal axis, intervals, no acute ST/T changes

## 2014-04-28 NOTE — Progress Notes (Signed)
Pre visit review using our clinic review tool, if applicable. No additional management support is needed unless otherwise documented below in the visit note. 

## 2014-04-28 NOTE — Assessment & Plan Note (Signed)
Anticipate post-herpetic neuralgia as started after shingles and elavil helped in past, but was too sedating. Will trial gabapentin, start at 100mg  nightly, discussed slow titration.

## 2014-04-28 NOTE — Patient Instructions (Addendum)
I do think you have post-shingles pain. Try gabapentin 100mg  nightly. If this doesn't help, may increase to twice daily after 4-5 days. Let me know how this works for you.  For biceps tendon - take aleve OTC as needed, ice area, and stretching exercises provided today. EKG was normal today. Continue stress relieving strategies at home - this will help with stress at work. If not improving with above, let me know. Good to see you today, call us with questions. Return at your convenience for physical, prior fasting for labwork.

## 2014-04-28 NOTE — Assessment & Plan Note (Signed)
Likely from awkward sleeping positioning - currently sleeping in small bed with husband and 45yo child, sleeps with arms over her head. Treat with aleve prn, ice, and provided with exercises from SM pt advisor on biceps tendonitis

## 2014-05-16 DIAGNOSIS — E042 Nontoxic multinodular goiter: Secondary | ICD-10-CM

## 2014-05-16 HISTORY — DX: Nontoxic multinodular goiter: E04.2

## 2014-05-17 ENCOUNTER — Encounter: Payer: Self-pay | Admitting: Family Medicine

## 2014-05-21 ENCOUNTER — Other Ambulatory Visit (INDEPENDENT_AMBULATORY_CARE_PROVIDER_SITE_OTHER): Payer: BC Managed Care – PPO

## 2014-05-21 DIAGNOSIS — I10 Essential (primary) hypertension: Secondary | ICD-10-CM

## 2014-05-21 DIAGNOSIS — D509 Iron deficiency anemia, unspecified: Secondary | ICD-10-CM

## 2014-05-21 DIAGNOSIS — K909 Intestinal malabsorption, unspecified: Secondary | ICD-10-CM

## 2014-05-21 LAB — BASIC METABOLIC PANEL
BUN: 15 mg/dL (ref 6–23)
CALCIUM: 8.7 mg/dL (ref 8.4–10.5)
CO2: 24 meq/L (ref 19–32)
CREATININE: 0.9 mg/dL (ref 0.4–1.2)
Chloride: 105 mEq/L (ref 96–112)
GFR: 90.38 mL/min (ref >60.00)
Glucose, Bld: 100 mg/dL — ABNORMAL HIGH (ref 70–99)
Potassium: 4.1 mEq/L (ref 3.5–5.1)
SODIUM: 138 meq/L (ref 135–145)

## 2014-05-21 LAB — VITAMIN D 25 HYDROXY (VIT D DEFICIENCY, FRACTURES): VITD: 17.93 ng/mL — AB (ref 30.00–100.00)

## 2014-05-21 LAB — HEMOGLOBIN: HEMOGLOBIN: 14.4 g/dL (ref 12.0–15.0)

## 2014-05-21 LAB — FERRITIN: Ferritin: 23.6 ng/mL (ref 10.0–291.0)

## 2014-05-28 ENCOUNTER — Ambulatory Visit (INDEPENDENT_AMBULATORY_CARE_PROVIDER_SITE_OTHER): Payer: BC Managed Care – PPO | Admitting: Family Medicine

## 2014-05-28 ENCOUNTER — Encounter (INDEPENDENT_AMBULATORY_CARE_PROVIDER_SITE_OTHER): Payer: Self-pay

## 2014-05-28 ENCOUNTER — Encounter: Payer: Self-pay | Admitting: Family Medicine

## 2014-05-28 ENCOUNTER — Encounter: Payer: BC Managed Care – PPO | Admitting: Family Medicine

## 2014-05-28 VITALS — BP 134/74 | HR 72 | Temp 97.3°F | Ht 66.0 in | Wt 238.5 lb

## 2014-05-28 DIAGNOSIS — E042 Nontoxic multinodular goiter: Secondary | ICD-10-CM | POA: Insufficient documentation

## 2014-05-28 DIAGNOSIS — E559 Vitamin D deficiency, unspecified: Secondary | ICD-10-CM

## 2014-05-28 DIAGNOSIS — Z1239 Encounter for other screening for malignant neoplasm of breast: Secondary | ICD-10-CM

## 2014-05-28 DIAGNOSIS — D509 Iron deficiency anemia, unspecified: Secondary | ICD-10-CM

## 2014-05-28 DIAGNOSIS — R590 Localized enlarged lymph nodes: Secondary | ICD-10-CM

## 2014-05-28 DIAGNOSIS — Z0000 Encounter for general adult medical examination without abnormal findings: Secondary | ICD-10-CM

## 2014-05-28 DIAGNOSIS — G8929 Other chronic pain: Secondary | ICD-10-CM

## 2014-05-28 DIAGNOSIS — I10 Essential (primary) hypertension: Secondary | ICD-10-CM

## 2014-05-28 DIAGNOSIS — M898X1 Other specified disorders of bone, shoulder: Secondary | ICD-10-CM

## 2014-05-28 DIAGNOSIS — E041 Nontoxic single thyroid nodule: Secondary | ICD-10-CM

## 2014-05-28 MED ORDER — ERGOCALCIFEROL 1.25 MG (50000 UT) PO CAPS: 50000.0000 [IU] | ORAL_CAPSULE | ORAL | Status: DC

## 2014-05-28 NOTE — Progress Notes (Signed)
Pre visit review using our clinic review tool, if applicable. No additional management support is needed unless otherwise documented below in the visit note. 

## 2014-05-28 NOTE — Assessment & Plan Note (Signed)
Treat with 50000 iu weekly for 8 wks then 1000 IU daily.

## 2014-05-28 NOTE — Progress Notes (Signed)
BP 134/74 mmHg  Pulse 72  Temp(Src) 97.3 F (36.3 C) (Oral)  Ht 5\' 6"  (1.676 m)  Wt 238 lb 8 oz (108.183 kg)  BMI 38.51 kg/m2   CC: CPE  Subjective:    Patient ID: Theresa Pratt, female    DOB: Mar 01, 1971, 43 y.o.   MRN: [id number]  HPI: Theresa Pratt is a 43 y.o. female presenting on 05/28/2014 for Annual Exam   Post herpetic neuralgia - gabapentin 100mg  started. Just finishing building new house. ST present for last 1 week. Wt Readings from Last 3 Encounters:  05/28/14 238 lb 8 oz (108.183 kg)  04/28/14 235 lb 12 oz (106.935 kg)  10/19/13 228 lb 8 oz (103.647 kg)  Body mass index is 38.51 kg/(m^2).  LMP - has IUD.  Preventative: Well woman - [facility]. Due for f/u. Mammogram 06/2012 will schedule.  Received flu shot this year Tetanus shot - 02/2009  Lives with parents, husband, 2 children and small dog  Currently in school getting Master's in Agricultural Ed at [employer]. Also working full time in admin at [employer]  Activity: no regular exercise  Diet: good water, fruits/vegetables daily, does like juice   Relevant past medical, surgical, family and social history reviewed and updated as indicated.  Allergies and medications reviewed and updated. Current Outpatient Prescriptions on File Prior to Visit  Medication Sig  . gabapentin (NEURONTIN) 100 MG capsule Take 1 capsule (100 mg total) by mouth at bedtime.  . hydrochlorothiazide (HYDRODIURIL) 12.5 MG tablet Take 1 tablet (12.5 mg total) by mouth daily.  . Iron Combinations TABS Take 1 tablet by mouth daily.  Marland Kitchen levonorgestrel (MIRENA) 20 MCG/24HR IUD 1 each by Intrauterine route once.  . Multiple Vitamin (MULTIVITAMIN) tablet Take 1 tablet by mouth daily.    . ranitidine (ZANTAC) 150 MG tablet Take 150 mg by mouth 2 (two) times daily.   No current facility-administered medications on file prior to visit.   Family History  Problem Relation Age of Onset  . Coronary artery disease Father     2  stents; + smoker  . Hypertension Father   . Thyroid disease Brother   . Diabetes Maternal Grandmother   . Cirrhosis Paternal Grandmother   . Alcohol abuse Paternal Grandmother   . Breast cancer      Paternal great aunt(obesity, smoker, EtOHic)  . Breast cancer      Maternal great aunt (deceased in 3's from same)  . Thyroid disease Other     cousins and grandmother  . Cancer Other     great aunt - lung  . Liver disease Paternal Aunt   . Cancer Cousin     liver, bile duct   Review of Systems  Constitutional: Negative for fever, chills, activity change, appetite change, fatigue and unexpected weight change.  HENT: Positive for sore throat. Negative for hearing loss.   Eyes: Negative for visual disturbance.  Respiratory: Negative for cough, chest tightness, shortness of breath and wheezing.   Cardiovascular: Negative for chest pain, palpitations and leg swelling.  Gastrointestinal: Positive for diarrhea (chronic issue). Negative for nausea, vomiting, abdominal pain, constipation, blood in stool and abdominal distention.  Genitourinary: Negative for hematuria and difficulty urinating.  Musculoskeletal: Negative for myalgias, arthralgias and neck pain.  Skin: Negative for rash.  Neurological: Negative for dizziness, seizures, syncope and headaches.  Hematological: Negative for adenopathy. Does not bruise/bleed easily.  Psychiatric/Behavioral: Negative for dysphoric mood. The patient is not nervous/anxious.    Per HPI unless  specifically indicated above    Objective:    BP 134/74 mmHg  Pulse 72  Temp(Src) 97.3 F (36.3 C) (Oral)  Ht 5\' 6"  (1.676 m)  Wt 238 lb 8 oz (108.183 kg)  BMI 38.51 kg/m2  Physical Exam  Constitutional: She is oriented to person, place, and time. She appears well-developed and well-nourished. No distress.  HENT:  Head: Normocephalic and atraumatic.  Right Ear: Hearing, tympanic membrane, external ear and ear canal normal.  Left Ear: Hearing, tympanic  membrane, external ear and ear canal normal.  Nose: Nose normal.  Mouth/Throat: Uvula is midline, oropharynx is clear and moist and mucous membranes are normal. No oropharyngeal exudate, posterior oropharyngeal edema, posterior oropharyngeal erythema or tonsillar abscesses.  No tonsillar exudate or enlargement  Eyes: Conjunctivae and EOM are normal. Pupils are equal, round, and reactive to light. No scleral icterus.  Neck: Normal range of motion. Neck supple. No thyromegaly (L thyroid nodule) present.  Cardiovascular: Normal rate, regular rhythm, normal heart sounds and intact distal pulses.   No murmur heard. Pulses:      Radial pulses are 2+ on the right side, and 2+ on the left side.  Pulmonary/Chest: Effort normal and breath sounds normal. No respiratory distress. She has no wheezes. She has no rales.  Abdominal: Soft. Bowel sounds are normal. She exhibits no distension and no mass. There is no tenderness. There is no rebound and no guarding.  Musculoskeletal: Normal range of motion. She exhibits no edema.  Lymphadenopathy:       Head (right side): No submental, no submandibular, no tonsillar, no preauricular and no posterior auricular adenopathy present.       Head (left side): Submandibular (R tender) adenopathy present. No submental, no tonsillar, no preauricular and no posterior auricular adenopathy present.    She has no cervical adenopathy.    She has no axillary adenopathy.       Right axillary: No lateral adenopathy present.       Left axillary: No lateral adenopathy present. Neurological: She is alert and oriented to person, place, and time.  CN grossly intact, station and gait intact  Skin: Skin is warm and dry. No rash noted.  Psychiatric: She has a normal mood and affect. Her behavior is normal. Judgment and thought content normal.  Nursing note and vitals reviewed.  Results for orders placed or performed in visit on 35/32/99  Basic metabolic panel  Result Value Ref Range     Sodium 138 135 - 145 mEq/L   Potassium 4.1 3.5 - 5.1 mEq/L   Chloride 105 96 - 112 mEq/L   CO2 24 19 - 32 mEq/L   Glucose, Bld 100 (H) 70 - 99 mg/dL   BUN 15 6 - 23 mg/dL   Creatinine, Ser 0.9 0.4 - 1.2 mg/dL   Calcium 8.7 8.4 - 10.5 mg/dL   GFR 90.38 >60.00 mL/min  Ferritin  Result Value Ref Range   Ferritin 23.6 10.0 - 291.0 ng/mL  Hemoglobin  Result Value Ref Range   Hemoglobin 14.4 12.0 - 15.0 g/dL  Vit D  25 hydroxy (rtn osteoporosis monitoring)  Result Value Ref Range   VITD 17.93 (L) 30.00 - 100.00 ng/mL       Assessment & Plan:   Problem List Items Addressed This Visit    Vitamin D deficiency    Treat with 50000 iu weekly for 8 wks then 1000 IU daily.    Left thyroid nodule    Thyroid nodule noted today. Strong fmhx thyroid  disease but not cancer. Check ultrasound and TSH/free T4.    Relevant Orders      TSH      US Soft Tissue Head/Neck      T4, free   LAD (lymphadenopathy), submandibular    Of short duration (1wk). Anticipate due to viral pharyngitis. Advised salt water gargles and ibuprofen as needed. Check neck US for thyroid nodule in interim.    Iron deficiency anemia    Much improvd since IUD placed.    Healthcare maintenance - Primary    Preventative protocols reviewed and updated unless pt declined. Discussed healthy diet and lifestyle.     Essential hypertension    Chronic, stable. Continue hctz    Chronic scapular pain    Improved on gabapentin.     Other Visit Diagnoses    Screening for breast cancer        Relevant Orders       MM DIGITAL SCREENING BILATERAL        Follow up plan: Return as needed.

## 2014-05-28 NOTE — Patient Instructions (Addendum)
Good to see you today, call us with questions. We will call you to schedule mammogram. Start vitamin D 50,000 units weekly for 8 weeks then start 1000 units daily over the counter. Pass by Linda's office to schedule thyroid ultrasound. Thyroid check today. Incorporate exercise into routine.

## 2014-05-28 NOTE — Assessment & Plan Note (Signed)
Preventative protocols reviewed and updated unless pt declined. Discussed healthy diet and lifestyle.  

## 2014-05-28 NOTE — Assessment & Plan Note (Signed)
Improved on gabapentin 

## 2014-05-28 NOTE — Assessment & Plan Note (Signed)
Thyroid nodule noted today. Strong fmhx thyroid disease but not cancer. Check ultrasound and TSH/free T4.

## 2014-05-28 NOTE — Assessment & Plan Note (Signed)
Of short duration (1wk). Anticipate due to viral pharyngitis. Advised salt water gargles and ibuprofen as needed. Check neck US for thyroid nodule in interim.

## 2014-05-28 NOTE — Assessment & Plan Note (Signed)
Much improvd since IUD placed.

## 2014-05-28 NOTE — Assessment & Plan Note (Signed)
Chronic, stable. Continue hctz.  

## 2014-05-29 LAB — T4, FREE: Free T4: 1.01 ng/dL (ref 0.80–1.80)

## 2014-05-29 LAB — TSH: TSH: 1.285 u[IU]/mL (ref 0.350–4.500)

## 2014-05-31 ENCOUNTER — Encounter: Payer: Self-pay | Admitting: Family Medicine

## 2014-05-31 ENCOUNTER — Encounter: Payer: Self-pay | Admitting: *Deleted

## 2014-05-31 ENCOUNTER — Telehealth: Payer: Self-pay

## 2014-05-31 ENCOUNTER — Ambulatory Visit (HOSPITAL_COMMUNITY)
Admission: RE | Admit: 2014-05-31 | Discharge: 2014-05-31 | Disposition: A | Discharge status: Home / Self Care | Payer: BC Managed Care – PPO | Source: Ambulatory Visit | Attending: Family Medicine | Admitting: Family Medicine

## 2014-05-31 DIAGNOSIS — E041 Nontoxic single thyroid nodule: Secondary | ICD-10-CM | POA: Diagnosis present

## 2014-05-31 NOTE — Telephone Encounter (Signed)
Pt left v/m requesting cb [phone] when [place] of thyroid and lab results available.

## 2014-05-31 NOTE — Telephone Encounter (Addendum)
Please see [place] results. plz call patient to ensure she is aware

## 2014-06-01 ENCOUNTER — Telehealth: Payer: Self-pay | Admitting: Family Medicine

## 2014-06-01 NOTE — Telephone Encounter (Signed)
Pt returned your call regarding lab and Thyroid US results. I informed her of Dr. Darnell Level comments. Pt verbalized understanding of comments. Pt had no questions at this time.

## 2014-06-01 NOTE — Telephone Encounter (Signed)
Message left notifying patient that I had mailed her a letter regarding labs and [place] results. Advised to call me back if she wanted more details prior to receiving the letter.

## 2014-06-07 ENCOUNTER — Other Ambulatory Visit: Payer: Self-pay | Admitting: Family Medicine

## 2014-06-07 DIAGNOSIS — N6489 Other specified disorders of breast: Secondary | ICD-10-CM

## 2014-06-29 ENCOUNTER — Telehealth: Payer: Self-pay | Admitting: Hematology and Oncology

## 2014-06-29 NOTE — Telephone Encounter (Signed)
, °

## 2014-07-24 ENCOUNTER — Other Ambulatory Visit: Payer: Self-pay | Admitting: Family Medicine

## 2014-09-23 ENCOUNTER — Other Ambulatory Visit: Payer: Self-pay

## 2014-09-23 DIAGNOSIS — D509 Iron deficiency anemia, unspecified: Secondary | ICD-10-CM

## 2014-09-24 ENCOUNTER — Other Ambulatory Visit: Payer: BC Managed Care – PPO

## 2014-09-24 ENCOUNTER — Telehealth: Payer: Self-pay | Admitting: Hematology and Oncology

## 2014-09-24 ENCOUNTER — Other Ambulatory Visit: Payer: Self-pay

## 2014-09-24 ENCOUNTER — Ambulatory Visit: Payer: BC Managed Care – PPO | Admitting: Hematology and Oncology

## 2014-09-24 NOTE — Telephone Encounter (Signed)
No vm set up mailed a calendar to pt

## 2014-09-24 NOTE — Assessment & Plan Note (Signed)
Chronic iron deficiency anemia due to prior history of gastric bypass surgery and menorrhagia, last iron infusion was October 2014  Iron studies were reviewed, she does not need IV iron therapy. Return to clinic once a year for iron studies and follow-up

## 2014-09-24 NOTE — Telephone Encounter (Signed)
Called pt to r/s her missed appt and she does not have a vm that has been set up,mailed a calendar to pt

## 2014-10-21 ENCOUNTER — Other Ambulatory Visit: Payer: BC Managed Care – PPO

## 2014-10-21 ENCOUNTER — Ambulatory Visit: Payer: BC Managed Care – PPO | Admitting: Hematology and Oncology

## 2014-11-05 ENCOUNTER — Encounter: Payer: Self-pay | Admitting: Primary Care

## 2014-11-05 ENCOUNTER — Ambulatory Visit (INDEPENDENT_AMBULATORY_CARE_PROVIDER_SITE_OTHER): Payer: BC Managed Care – PPO | Admitting: Primary Care

## 2014-11-05 VITALS — BP 144/94 | HR 90 | Temp 97.4°F | Ht 66.0 in | Wt 239.1 lb

## 2014-11-05 DIAGNOSIS — M5441 Lumbago with sciatica, right side: Secondary | ICD-10-CM

## 2014-11-05 DIAGNOSIS — I10 Essential (primary) hypertension: Secondary | ICD-10-CM

## 2014-11-05 MED ORDER — CYCLOBENZAPRINE HCL 5 MG PO TABS: 5.0000 mg | ORAL_TABLET | Freq: Three times a day (TID) | ORAL | Status: DC | PRN

## 2014-11-05 MED ORDER — HYDROCHLOROTHIAZIDE 12.5 MG PO TABS: 12.5000 mg | ORAL_TABLET | Freq: Every day | ORAL | Status: DC

## 2014-11-05 MED ORDER — IBUPROFEN 600 MG PO TABS: 600.0000 mg | ORAL_TABLET | Freq: Three times a day (TID) | ORAL | Status: DC | PRN

## 2014-11-05 NOTE — Progress Notes (Signed)
Subjective:    Patient ID: Theresa Pratt, female    DOB: 1970/11/07, 44 y.o.   MRN: [id number]  HPI  Theresa Pratt is a 44 year old female who presents today with a chief complaint of low back pain. Her pain began last Monday after sudden movement to reach the remote control on the table. She moved twisting her upper body without moving her lower back and heard a "pop". She took some aspirin for several days with relief of her pain. The pain then returned today she started expericing an ache and muscle tightness that hasn't improved with aspirin.   The pain is across her lower back down to right leg, and describes it as sore, tight, and achy. Fast movement makes her pain worse, so she has to move slowly. She has only taken Aspirin, has not tried heat or ice. She has tingling to right leg when sitting, denies numbness, recent trauma.  Review of Systems  Respiratory: Negative for shortness of breath.   Cardiovascular: Negative for chest pain.  Genitourinary: Negative for difficulty urinating.  Musculoskeletal: Positive for myalgias and back pain.  Neurological: Negative for dizziness, weakness and numbness.       Past Medical History  Diagnosis Date  . Morbid obesity     Pre gastric bypass  . HTN (hypertension)     Pre gastric bypass  . DM (diabetes mellitus)     pre gastric bypass  . Arthropathy     weight bearing (pre gastric bypass)  . Oligomenorrhea     on Implanon (pre gastric bypass)  . Allergic rhinitis     post gastric bypass  . HTN (hypertension)     post gastric bypass  . GERD (gastroesophageal reflux disease)     post gastric bypass  . Pre-eclampsia     post gastric bypass  . Postpartum depression     post gastric bypass  . IDA (iron deficiency anemia)     post gastric bypass (iron infusion by hematology-doesn't build stores -Dr. Humphrey Rolls)  . 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 Marital Status: Married    Spouse Name: N/A   . Number of Children: 2  . Years of Education: N/A   Occupational History  . [employer] Dept Head    Social History Main Topics  . Smoking status: Never Smoker   . Smokeless tobacco: Never Used  . Alcohol Use: Yes     Comment: occasional  . Drug Use: No  . Sexual Activity:    Partners: Male    Birth Control/ Protection: None   Other Topics Concern  . 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 Rectal condyloma removal  2006  . Implanon placed  03/16/09    (3 years)  . Cholecystectomy  08/2010    Family History  Problem Relation Age of Onset  . Coronary artery disease Father     2 stents; + smoker  . Hypertension Father   . Thyroid disease Brother   .  Diabetes Maternal Grandmother   . Cirrhosis Paternal Grandmother   . Alcohol abuse Paternal Grandmother   . Breast cancer      Paternal great aunt(obesity, smoker, EtOHic)  . Breast cancer      Maternal great aunt (deceased in 67's from same)  . Thyroid disease Other     cousins and grandmother  . Cancer Other     great aunt - lung  . Liver disease Paternal Aunt   . Cancer Cousin     liver, bile duct    No Known Allergies  Current Outpatient Prescriptions on File Prior to Visit  Medication Sig Dispense Refill  . cholecalciferol (VITAMIN D) 1000 UNITS tablet Take 1,000 Units by mouth daily.    . ergocalciferol (VITAMIN D2) 50000 UNITS capsule Take 1 capsule (50,000 Units total) by mouth once a week. 8 capsule 0  . gabapentin (NEURONTIN) 100 MG capsule Take 1 capsule (100 mg total) by mouth at  bedtime. 60 capsule 3  . Iron Combinations TABS Take 1 tablet by mouth daily.    Marland Kitchen levonorgestrel (MIRENA) 20 MCG/24HR IUD 1 each by Intrauterine route once.    . Multiple Vitamin (MULTIVITAMIN) tablet Take 1 tablet by mouth daily.      . ranitidine (ZANTAC) 150 MG tablet Take 150 mg by mouth 2 (two) times daily.     No current facility-administered medications on file prior to visit.    BP 144/94 mmHg  Pulse 90  Temp(Src) 97.4 F (36.3 C) (Oral)  Ht 5\' 6"  (1.676 m)  Wt 239 lb 1.9 oz (108.464 kg)  BMI 38.61 kg/m2  SpO2 98%     Objective:   Physical Exam  Constitutional: She is oriented to person, place, and time. She appears well-developed.  Cardiovascular: Normal rate and regular rhythm.   Pulmonary/Chest: Effort normal and breath sounds normal.  Musculoskeletal:       Lumbar back: She exhibits decreased range of motion, tenderness, pain and spasm.  Decreased ROM to right lateral bending and forward flexion. ROM normal to left lateral bend. Positive straight leg raise on right.    Neurological: She is alert and oriented to person, place, and time. She has normal reflexes.  Skin: Skin is warm and dry.          Assessment & Plan:

## 2014-11-05 NOTE — Assessment & Plan Note (Signed)
Positive straight leg raise on right, tender upon palpation to lower back bilaterally. Suspect muscle spasm. Patient reports improvement in pain with massage. Flexeril at bedtime, may take up to three times daily, but warned patient this may cause drowsiness.  Alternate ibuprofen and tylenol for pain. Heat/Ice. Follow up if no improvement.

## 2014-11-05 NOTE — Progress Notes (Signed)
Pre visit review using our clinic review tool, if applicable. No additional management support is needed unless otherwise documented below in the visit note. 

## 2014-11-05 NOTE — Patient Instructions (Signed)
Your back pain is likely due to muscle spasms in your lower back. You may take the Flexeril up to three times daily as needed. This medication may make you drowsy, please be cautious. Alternate ibuprofen and tylenol for pain.  Try heat or ice application to your back for relief. I hope you feel better! Take care!  Back Pain, Adult Low back pain is very common. About 1 in 5 people have back pain.The cause of low back pain is rarely dangerous. The pain often gets better over time.About half of people with a sudden onset of back pain feel better in just 2 weeks. About 8 in 10 people feel better by 6 weeks.  CAUSES Some common causes of back pain include:  Strain of the muscles or ligaments supporting the spine.  Wear and tear (degeneration) of the spinal discs.  Arthritis.  Direct injury to the back. DIAGNOSIS Most of the time, the direct cause of low back pain is not known.However, back pain can be treated effectively even when the exact cause of the pain is unknown.Answering your caregiver's questions about your overall health and symptoms is one of the most accurate ways to make sure the cause of your pain is not dangerous. If your caregiver needs more information, he or she may order lab work or imaging tests (X-rays or MRIs).However, even if imaging tests show changes in your back, this usually does not require surgery. HOME CARE INSTRUCTIONS For many people, back pain returns.Since low back pain is rarely dangerous, it is often a condition that people can learn to [facility] their own.   Remain active. It is stressful on the back to sit or stand in one place. Do not sit, drive, or stand in one place for more than 30 minutes at a time. Take short walks on level surfaces as soon as pain allows.Try to increase the length of time you walk each day.  Do not stay in bed.Resting more than 1 or 2 days can delay your recovery.  Do not avoid exercise or work.Your body is made to move.It  is not dangerous to be active, even though your back may hurt.Your back will likely heal faster if you return to being active before your pain is gone.  Pay attention to your body when you bend and lift. Many people have less discomfortwhen lifting if they bend their knees, keep the load close to their bodies,and avoid twisting. Often, the most comfortable positions are those that put less stress on your recovering back.  Find a comfortable position to sleep. Use a firm mattress and lie on your side with your knees slightly bent. If you lie on your back, put a pillow under your knees.  Only take over-the-counter or prescription medicines as directed by your caregiver. Over-the-counter medicines to reduce pain and inflammation are often the most helpful.Your caregiver may prescribe muscle relaxant drugs.These medicines help dull your pain so you can more quickly return to your normal activities and healthy exercise.  Put ice on the injured area.  Put ice in a plastic bag.  Place a towel between your skin and the bag.  Leave the ice on for 15-20 minutes, 03-04 times a day for the first 2 to 3 days. After that, ice and heat may be alternated to reduce pain and spasms.  Ask your caregiver about trying back exercises and gentle massage. This may be of some benefit.  Avoid feeling anxious or stressed.Stress increases muscle tension and can worsen back pain.It is  important to recognize when you are anxious or stressed and learn ways to manage it.Exercise is a great option. SEEK MEDICAL CARE IF:  You have pain that is not relieved with rest or medicine.  You have pain that does not improve in 1 week.  You have new symptoms.  You are generally not feeling well. SEEK IMMEDIATE MEDICAL CARE IF:   You have pain that radiates from your back into your legs.  You develop new bowel or bladder control problems.  You have unusual weakness or numbness in your arms or legs.  You develop  nausea or vomiting.  You develop abdominal pain.  You feel faint. Document Released: 07/02/2005 Document Revised: 01/01/2012 Document Reviewed: 11/03/2013 [facility] Patient Information 2015 [place], [place]. This information is not intended to replace advice given to you by your health care provider. Make sure you discuss any questions you have with your health care provider.

## 2014-11-08 ENCOUNTER — Telehealth: Payer: Self-pay

## 2014-11-08 NOTE — Telephone Encounter (Signed)
plz call for f/u. If not seen over weekend would offer to phone in tramadol prescription 50mg  BID PRN pain, #30 RF 0

## 2014-11-08 NOTE — Telephone Encounter (Signed)
PLEASE NOTE: All timestamps contained within this report are represented as [place] Standard Time. CONFIDENTIALTY NOTICE: This fax transmission is intended only for the addressee. It contains information that is legally privileged, confidential or otherwise protected from use or disclosure. If you are not the intended recipient, you are strictly prohibited from reviewing, disclosing, copying using or disseminating any of this information or taking any action in reliance on or regarding this information. If you have received this fax in error, please notify us immediately by telephone so that we can arrange for its return to [place]. Phone: [phone], Toll-Free: [phone], Fax: [phone] Page: 1 of 1 Call Id: [id number] [place] Patient Name: Theresa Pratt Gender: Female DOB: [birth year] Age: 44 Y 10 M 21 D Return Phone Number: [phone] (Primary) [place] [address] Client [place] Night - Client Client Site [place] Physician [name] Contact Type Call Call Type Triage / Clinical Relationship To Patient Self Return Phone Number [phone] (Primary) Chief Complaint Muscle Jerks, Tics And Shudders Initial Comment caller states she is having muscle spasms in her back Nurse Assessment Nurse: Erlene Quan, RN, Manuela Schwartz Date/Time Eilene Ghazi Time): 11/06/2014 9:56:20 AM Confirm and document reason for call. If symptomatic, describe symptoms. ---caller states she is having muscle spasms in her back- she has had these before and she usually get a muscle relaxant and pain medication - states she had a message but didn't help she can not do anything - she was seen yesterday and was given the muscle relaxant but only 600 mg ibuprofen and it is not helping the pain at all - she does not need to go back  thru everything she needs pain medication Has the patient traveled out of the country within the last 30 days? ---Not Applicable Does the patient require triage? ---Declined Triage Please document clinical information provided and list any resource used. ---Advised caller she will have to be seen to get and further medications - gave her the address and number for the [facility] advised she can call and see if they can get her scheduled - if not she can go to ER or any UC for assistance with her back pain Guidelines Guideline Title Affirmed Question Affirmed Notes Nurse Date/Time ([place] Time) Disp. Time Eilene Ghazi Time) Disposition Final User 11/06/2014 9:47:55 AM Send To Clinical Follow Up Jeral Fruit 11/06/2014 10:04:26 AM Clinical Call Yes Erlene Quan RN, Manuela Schwartz After Care Instructions Given Call Event Type User Date / Time Description

## 2014-11-08 NOTE — Telephone Encounter (Signed)
Spoke with patient and she said she didn't get evaluated over the weekend, but said that the ibuprofen isn't helping at all. Advised to stop ibuprofen and I would call in tramadol. Rx called in as directed.

## 2015-01-23 ENCOUNTER — Ambulatory Visit
Admission: EM | Admit: 2015-01-23 | Discharge: 2015-01-23 | Disposition: A | Discharge status: Home / Self Care | Payer: BC Managed Care – PPO | Attending: Family Medicine | Admitting: Family Medicine

## 2015-01-23 DIAGNOSIS — S39012A Strain of muscle, fascia and tendon of lower back, initial encounter: Secondary | ICD-10-CM | POA: Diagnosis not present

## 2015-01-23 DIAGNOSIS — M5431 Sciatica, right side: Secondary | ICD-10-CM

## 2015-01-23 MED ORDER — CYCLOBENZAPRINE HCL 10 MG PO TABS: 10.0000 mg | ORAL_TABLET | Freq: Every day | ORAL | Status: DC

## 2015-01-23 MED ORDER — PREDNISONE 20 MG PO TABS: 20.0000 mg | ORAL_TABLET | Freq: Every day | ORAL | Status: DC

## 2015-01-23 NOTE — ED Provider Notes (Signed)
CSN: [id number]     Arrival date & time 01/23/15  1202 History   First MD Initiated Contact with Patient 01/23/15 1242     Chief Complaint  Patient presents with  . Back Pain   (Consider location/radiation/quality/duration/timing/severity/associated sxs/prior Treatment) HPI Comments: 44 yo female with a h/o low back pain since yesterday. States pain started developing yesterday during the day as she was doing some cleaning at her house. Denies any falls or specific injuries. States woke up this morning and pain was worse. States pain radiates down the legs but worse on the right side. Denies any bowel or bladder problems or saddle anesthesia.   Patient is a 44 y.o. female presenting with back pain. The history is provided by the patient.  Back Pain Location:  Lumbar spine Quality:  Aching Radiates to:  R posterior upper leg Pain severity:  Mild Pain is:  Same all the time Onset quality:  Gradual Duration:  1 day Timing:  Constant Progression:  Worsening Chronicity:  New Context: lifting heavy objects and twisting   Context: not falling, not MVA, not occupational injury, not recent illness and not recent injury   Associated symptoms: leg pain   Associated symptoms: no bladder incontinence, no bowel incontinence, no fever, no paresthesias, no perianal numbness, no tingling and no weakness     Past Medical History  Diagnosis Date  . Morbid obesity     Pre gastric bypass  . HTN (hypertension)     Pre gastric bypass  . DM (diabetes mellitus)     pre gastric bypass  . Arthropathy     weight bearing (pre gastric bypass)  . Oligomenorrhea     on Implanon (pre gastric bypass)  . Allergic rhinitis     post gastric bypass  . HTN (hypertension)     post gastric bypass  . GERD (gastroesophageal reflux disease)     post gastric bypass  . Pre-eclampsia     post gastric bypass  . Postpartum depression     post gastric bypass  . IDA (iron deficiency anemia)     post gastric bypass  (iron infusion by hematology-doesn't build stores -Dr. Humphrey Rolls)  . 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 Rectal condyloma removal  2006  . Implanon placed  03/16/09    (3 years)  . Cholecystectomy  08/2010   Family History  Problem Relation Age of Onset  . Coronary artery disease Father     2 stents; + smoker  . Hypertension Father   . Thyroid disease Brother   . Diabetes Maternal Grandmother   . Cirrhosis Paternal Grandmother   . Alcohol abuse Paternal Grandmother   . Breast cancer      Paternal great aunt(obesity, smoker, EtOHic)  . Breast cancer      Maternal great aunt (deceased in 50's from same)  . Thyroid disease Other     cousins and grandmother  . Cancer Other     great aunt - lung  . Liver disease Paternal Aunt   . Cancer Cousin     liver, bile duct   History  Substance Use Topics  . Smoking status: Never Smoker   . Smokeless tobacco: Never Used  . Alcohol Use: Yes     Comment: occasional   OB History    Gravida Para Term Preterm AB TAB SAB Ectopic Multiple Living   2 2 2  2     Review of Systems  Constitutional: Negative for fever.  Gastrointestinal: Negative for bowel incontinence.  Genitourinary: Negative for bladder incontinence.  Musculoskeletal: Positive for back pain.  Neurological: Negative for tingling, weakness and paresthesias.    Allergies  Review of patient's allergies indicates no known allergies.  Home Medications   Prior to Admission medications   Medication Sig Start Date End Date Taking? Authorizing Provider  cholecalciferol (VITAMIN D) 1000 UNITS tablet Take 1,000 Units by mouth daily.    Historical Provider, MD  cyclobenzaprine (FLEXERIL) 10 MG tablet Take 1 tablet (10 mg total) by mouth at bedtime. 01/23/15   Norval Gable, MD  ergocalciferol (VITAMIN D2) 50000 UNITS capsule Take 1 capsule (50,000 Units total) by mouth once a week.  05/28/14   [name], MD  gabapentin (NEURONTIN) 100 MG capsule Take 1 capsule (100 mg total) by mouth at bedtime. 04/28/14   [name], MD  hydrochlorothiazide (HYDRODIURIL) 12.5 MG tablet Take 1 tablet (12.5 mg total) by mouth daily. 11/05/14   Pleas Koch, NP  ibuprofen (ADVIL,MOTRIN) 600 MG tablet Take 1 tablet (600 mg total) by mouth every 8 (eight) hours as needed. 11/05/14   Pleas Koch, NP  Iron Combinations TABS Take 1 tablet by mouth daily.    Historical Provider, MD  levonorgestrel (MIRENA) 20 MCG/24HR IUD 1 each by Intrauterine route once.    Historical Provider, MD  Multiple Vitamin (MULTIVITAMIN) tablet Take 1 tablet by mouth daily.      Historical Provider, MD  predniSONE (DELTASONE) 20 MG tablet Take 1 tablet (20 mg total) by mouth daily. 01/23/15   Norval Gable, MD  ranitidine (ZANTAC) 150 MG tablet Take 150 mg by mouth 2 (two) times daily.    Historical Provider, MD   BP 129/92 mmHg  Pulse 79  Temp(Src) 96.3 F (35.7 C) (Tympanic)  Resp 20  Ht 5\' 5"  (1.651 m)  Wt 236 lb (107.049 kg)  BMI 39.27 kg/m2  SpO2 100% Physical Exam  Constitutional: She appears well-developed and well-nourished. No distress.  Musculoskeletal: She exhibits tenderness. She exhibits no edema.       Lumbar back: She exhibits tenderness (over the lumbar sacral paraspinous muscles and right buttock muscle) and spasm. She exhibits normal range of motion, no bony tenderness, no swelling, no edema, no deformity, no laceration, no pain and normal pulse.  Neurological: She is alert. She has normal reflexes. She displays normal reflexes. She exhibits normal muscle tone.  Skin: Skin is warm and dry. No rash noted. She is not diaphoretic. No erythema.  Nursing note and vitals reviewed.   ED Course  Procedures (including critical care time) Labs Review Labs Reviewed - No data to display  Imaging Review No results found.   MDM   1. Lumbar strain, initial encounter   2.  Sciatica, right    Discharge Medication List as of 01/23/2015  1:01 PM    START taking these medications   Details  predniSONE (DELTASONE) 20 MG tablet Take 1 tablet (20 mg total) by mouth daily., Starting 01/23/2015, Until Discontinued, Normal      Plan: 1. diagnosis reviewed with patient 2. rx as per orders; risks, benefits, potential side effects reviewed with patient 3. Recommend supportive treatment with heat and gentle stretching 4. F/u prn if symptoms worsen or don't improve    Norval Gable, MD 01/23/15 [phone]

## 2015-01-23 NOTE — ED Notes (Signed)
Pt with lower back pain for the past day sore and throbbing.  Tingling down R leg. Denies any direct injury.

## 2015-04-05 ENCOUNTER — Encounter: Payer: Self-pay | Admitting: Primary Care

## 2015-04-05 ENCOUNTER — Ambulatory Visit (INDEPENDENT_AMBULATORY_CARE_PROVIDER_SITE_OTHER): Payer: BC Managed Care – PPO | Admitting: Primary Care

## 2015-04-05 ENCOUNTER — Ambulatory Visit (INDEPENDENT_AMBULATORY_CARE_PROVIDER_SITE_OTHER)
Admission: RE | Admit: 2015-04-05 | Discharge: 2015-04-05 | Disposition: A | Discharge status: Home / Self Care | Payer: BC Managed Care – PPO | Source: Ambulatory Visit | Attending: Primary Care | Admitting: Primary Care

## 2015-04-05 VITALS — BP 130/84 | HR 91 | Temp 97.9°F | Ht 66.0 in | Wt 235.0 lb

## 2015-04-05 DIAGNOSIS — Z23 Encounter for immunization: Secondary | ICD-10-CM | POA: Diagnosis not present

## 2015-04-05 DIAGNOSIS — M5441 Lumbago with sciatica, right side: Secondary | ICD-10-CM | POA: Diagnosis not present

## 2015-04-05 MED ORDER — PREDNISONE 20 MG PO TABS: ORAL_TABLET | ORAL | Status: DC

## 2015-04-05 MED ORDER — CYCLOBENZAPRINE HCL 5 MG PO TABS: 5.0000 mg | ORAL_TABLET | Freq: Three times a day (TID) | ORAL | Status: DC | PRN

## 2015-04-05 NOTE — Assessment & Plan Note (Signed)
Bilateral low back pain, right sided more so today. Present for years and has had multiple visits for the same complaint in 2016 alone. Lumbar xray performed today and is negative. RX for prednisone and flexeril to use PRN HS. Will send to PT for evaluation. If no improvement with PT, will consider MRI for further evaluation of possible bulging disc. She is to follow up with PCP at upcoming physical.

## 2015-04-05 NOTE — Progress Notes (Signed)
Subjective:    Patient ID: Theresa Pratt, female    DOB: 06-15-1971, 44 y.o.   MRN: [id number]  HPI  Theresa Pratt is a 44 year old female who presents today with a chief complaint of low back pain. Her pain has been present intermittently for the past several years. Her pain is located to the right lower side with radiation of pain down to her right leg. She describes her pain as achy. Her pain is improved with wearing high heels. She endorses numbness/tingling for several seconds intermittently. Her pain is worse when waking in the morning, and she notices her pain during intercourse. The most recent episode began this Sunday. She's taken advil, flexeril, and prednisone in the past with relief. She's never had imaging or completed physical therapy.  Review of Systems  Genitourinary:       Denies incontinence of bowel or bladder.  Musculoskeletal: Positive for back pain. Negative for joint swelling.  Neurological: Positive for numbness.       Past Medical History  Diagnosis Date  . Morbid obesity     Pre gastric bypass  . HTN (hypertension)     Pre gastric bypass  . DM (diabetes mellitus)     pre gastric bypass  . Arthropathy     weight bearing (pre gastric bypass)  . Oligomenorrhea     on Implanon (pre gastric bypass)  . Allergic rhinitis     post gastric bypass  . HTN (hypertension)     post gastric bypass  . GERD (gastroesophageal reflux disease)     post gastric bypass  . Pre-eclampsia     post gastric bypass  . Postpartum depression     post gastric bypass  . IDA (iron deficiency anemia)     post gastric bypass (iron infusion by hematology-doesn't build stores -Dr. Humphrey Rolls)  . 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 Marital Status: Married    Spouse Name: N/A  . Number of Children: 2  . Years of Education: N/A   Occupational History  . [employer] Dept Head    Social History Main Topics  . Smoking status: Never  Smoker   . Smokeless tobacco: Never Used  . Alcohol Use: Yes     Comment: occasional  . Drug Use: No  . Sexual Activity:    Partners: Male    Birth Control/ Protection: None   Other Topics Concern  . 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 Rectal condyloma removal  2006  . Implanon placed  03/16/09    (3 years)  . Cholecystectomy  08/2010    Family History  Problem Relation Age of Onset  . Coronary artery disease Father     2 stents; + smoker  . Hypertension Father   . Thyroid disease Brother   . Diabetes Maternal Grandmother   . Cirrhosis Paternal Grandmother   . Alcohol abuse Paternal Grandmother   . Breast cancer      Paternal great  aunt(obesity, smoker, EtOHic)  . Breast cancer      Maternal great aunt (deceased in 70's from same)  . Thyroid disease Other     cousins and grandmother  . Cancer Other     great aunt - lung  . Liver disease Paternal Aunt   . Cancer Cousin     liver, bile duct    No Known Allergies  Current Outpatient Prescriptions on File Prior to Visit  Medication Sig Dispense Refill  . cholecalciferol (VITAMIN D) 1000 UNITS tablet Take 1,000 Units by mouth daily.    . ergocalciferol (VITAMIN D2) 50000 UNITS capsule Take 1 capsule (50,000 Units total) by mouth once a week. 8 capsule 0  . gabapentin (NEURONTIN) 100 MG capsule Take 1 capsule (100 mg total) by mouth at bedtime. 60 capsule 3  . hydrochlorothiazide (HYDRODIURIL) 12.5 MG tablet Take 1 tablet (12.5 mg total) by mouth daily. 90 tablet 0  . ibuprofen (ADVIL,MOTRIN) 600 MG  tablet Take 1 tablet (600 mg total) by mouth every 8 (eight) hours as needed. 21 tablet 0  . Iron Combinations TABS Take 1 tablet by mouth daily.    Marland Kitchen levonorgestrel (MIRENA) 20 MCG/24HR IUD 1 each by Intrauterine route once.    . Multiple Vitamin (MULTIVITAMIN) tablet Take 1 tablet by mouth daily.      . ranitidine (ZANTAC) 150 MG tablet Take 150 mg by mouth 2 (two) times daily.     No current facility-administered medications on file prior to visit.    BP 130/84 mmHg  Pulse 91  Temp(Src) 97.9 F (36.6 C) (Oral)  Ht 5\' 6"  (1.676 m)  Wt 235 lb (106.595 kg)  BMI 37.95 kg/m2  SpO2 98%    Objective:   Physical Exam  Constitutional: She appears well-nourished.  Cardiovascular: Normal rate and regular rhythm.   Pulmonary/Chest: Effort normal and breath sounds normal.  Musculoskeletal: Normal range of motion.       Lumbar back: She exhibits pain. She exhibits normal range of motion and no tenderness.  Pain with extension of right leg, no radiculopathy today.          Assessment & Plan:

## 2015-04-05 NOTE — Progress Notes (Signed)
Pre visit review using our clinic review tool, if applicable. No additional management support is needed unless otherwise documented below in the visit note. 

## 2015-04-05 NOTE — Patient Instructions (Signed)
Start Prednisone tablets. Take 1 tablet by mouth daily for 5 days.  You may take Flexeril tablets three times daily as needed for back pain. This medication may make you drowsy.  Complete xray(s) prior to leaving today. I will contact you regarding your results.  Stop by the front and speak with Rosaria Ferries regarding your physical therapy.  It was a pleasure to see you today!

## 2015-04-14 ENCOUNTER — Telehealth: Payer: Self-pay

## 2015-04-14 NOTE — Telephone Encounter (Signed)
I have not received any paperwork to complete. Will you please investigate?

## 2015-04-14 NOTE — Telephone Encounter (Signed)
Pt left v/m following up on request from physical therapy for TENS machine due to back pain for sciatica. Pt request cb to know when paperwork done so ins will pay for TENS unit.

## 2015-04-18 NOTE — Telephone Encounter (Addendum)
Did you get any paper work for this patient. She is Dr Gutierrez's patient but seen Anda Kraft for acute then [facility] sent the referral for physical therapy.

## 2015-04-18 NOTE — Telephone Encounter (Signed)
I didn't receive anything. I'm wondering if it went to PT.

## 2015-04-18 NOTE — Telephone Encounter (Signed)
Message left for patient to return my call.  

## 2015-05-23 ENCOUNTER — Ambulatory Visit: Payer: BC Managed Care – PPO | Admitting: Obstetrics and Gynecology

## 2015-05-25 ENCOUNTER — Encounter: Payer: Self-pay | Admitting: Certified Nurse Midwife

## 2015-05-25 ENCOUNTER — Other Ambulatory Visit: Payer: Self-pay | Admitting: Family Medicine

## 2015-05-25 ENCOUNTER — Other Ambulatory Visit: Payer: Self-pay | Admitting: Obstetrics & Gynecology

## 2015-05-25 ENCOUNTER — Ambulatory Visit (INDEPENDENT_AMBULATORY_CARE_PROVIDER_SITE_OTHER): Payer: BC Managed Care – PPO | Admitting: Certified Nurse Midwife

## 2015-05-25 ENCOUNTER — Other Ambulatory Visit: Payer: Self-pay | Admitting: Certified Nurse Midwife

## 2015-05-25 VITALS — BP 134/95 | HR 102 | Ht 65.0 in | Wt 236.0 lb

## 2015-05-25 DIAGNOSIS — N6320 Unspecified lump in the left breast, unspecified quadrant: Secondary | ICD-10-CM

## 2015-05-25 DIAGNOSIS — Z124 Encounter for screening for malignant neoplasm of cervix: Secondary | ICD-10-CM

## 2015-05-25 DIAGNOSIS — Z9884 Bariatric surgery status: Secondary | ICD-10-CM

## 2015-05-25 DIAGNOSIS — Z30431 Encounter for routine checking of intrauterine contraceptive device: Secondary | ICD-10-CM

## 2015-05-25 DIAGNOSIS — K909 Intestinal malabsorption, unspecified: Secondary | ICD-10-CM

## 2015-05-25 DIAGNOSIS — I10 Essential (primary) hypertension: Secondary | ICD-10-CM

## 2015-05-25 DIAGNOSIS — Z1151 Encounter for screening for human papillomavirus (HPV): Secondary | ICD-10-CM

## 2015-05-25 DIAGNOSIS — Z01419 Encounter for gynecological examination (general) (routine) without abnormal findings: Secondary | ICD-10-CM

## 2015-05-25 DIAGNOSIS — D509 Iron deficiency anemia, unspecified: Secondary | ICD-10-CM

## 2015-05-25 DIAGNOSIS — F411 Generalized anxiety disorder: Secondary | ICD-10-CM

## 2015-05-25 DIAGNOSIS — E559 Vitamin D deficiency, unspecified: Secondary | ICD-10-CM

## 2015-05-25 MED ORDER — SERTRALINE HCL 25 MG PO TABS: 25.0000 mg | ORAL_TABLET | Freq: Every day | ORAL | Status: DC

## 2015-05-25 NOTE — Progress Notes (Signed)
Patient ID: Theresa Pratt, female   DOB: Nov 23, 1970, 44 y.o.   MRN: [id number]    GYNECOLOGY CLINIC ANNUAL PREVENTATIVE CARE ENCOUNTER NOTE  Subjective:   Theresa Pratt is a 44 y.o. [phone] female here for a routine annual gynecologic exam.  Current complaints: anxiety.   Denies abnormal vaginal bleeding, discharge, pelvic pain, problems with intercourse or other gynecologic concerns.    Gynecologic History No LMP recorded. Patient is not currently having periods (Reason: IUD). Contraception: IUD  Last mammogram: 2 years ago. Results were: normal  Obstetric History OB History  Gravida Para Term Preterm AB SAB TAB Ectopic Multiple Living  2 2 2       2     # Outcome Date GA Lbr Len/2nd Weight Sex Delivery Anes PTL Lv  2 Term           1 Term               Past Medical History  Diagnosis Date  . Morbid obesity ([place])     Pre gastric bypass  . HTN (hypertension)     Pre gastric bypass  . DM (diabetes mellitus) ([place])     pre gastric bypass  . Arthropathy     weight bearing (pre gastric bypass)  . Oligomenorrhea     on Implanon (pre gastric bypass)  . Allergic rhinitis     post gastric bypass  . HTN (hypertension)     post gastric bypass  . GERD (gastroesophageal reflux disease)     post gastric bypass  . Pre-eclampsia     post gastric bypass  . Postpartum depression     post gastric bypass  . IDA (iron deficiency anemia)     post gastric bypass (iron infusion by hematology-doesn't build stores -Dr. Humphrey Rolls)  . 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 Rectal condyloma removal  2006  . Implanon placed  03/16/09    (3 years)  . Cholecystectomy  08/2010    Current Outpatient Prescriptions on File Prior to Visit  Medication Sig Dispense Refill  . cholecalciferol (VITAMIN D) 1000 UNITS tablet Take 1,000 Units by mouth daily.    . cyclobenzaprine (FLEXERIL) 5 MG  tablet Take 1 tablet (5 mg total) by mouth 3 (three) times daily as needed for muscle spasms. 30 tablet 0  . ergocalciferol (VITAMIN D2) 50000 UNITS capsule Take 1 capsule (50,000 Units total) by mouth once a week. 8 capsule 0  . gabapentin (NEURONTIN) 100 MG capsule Take 1 capsule (100 mg total) by mouth at bedtime. 60 capsule 3  . hydrochlorothiazide (HYDRODIURIL) 12.5 MG tablet Take 1 tablet (12.5 mg total) by mouth daily. 90 tablet 0  . ibuprofen (ADVIL,MOTRIN) 600 MG tablet Take 1 tablet (600 mg total) by mouth every 8 (eight) hours as needed. 21 tablet 0  . Iron Combinations TABS Take 1 tablet by mouth daily.    Marland Kitchen levonorgestrel (MIRENA) 20 MCG/24HR IUD 1 each by Intrauterine route once.    . Multiple Vitamin (MULTIVITAMIN) tablet Take 1 tablet by mouth daily.      . predniSONE (DELTASONE) 20 MG tablet Take 1 tablet by mouth daily for 5 days. 5 tablet 0  . ranitidine (ZANTAC) 150 MG tablet Take 150 mg by mouth 2 (two) times daily.     No current facility-administered medications on file prior to visit.    No Known Allergies  Social History   Social History  . Marital Status: Married    Spouse Name: N/A  . Number of Children: 2  . Years of Education: N/A   Occupational History  . [employer] Dept Head    Social History Main Topics  . Smoking status: Never Smoker   . Smokeless tobacco: Never Used  . Alcohol Use: Yes     Comment: occasional  . Drug Use: No  . Sexual Activity:    Partners: Male    Birth Control/ Protection: None   Other Topics Concern  . 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  Wants to be substance abuse counselor   Activity: no regular exercise   Diet: good water, fruits/vegetables daily, does like juice    Family History  Problem Relation Age of Onset  .  Coronary artery disease Father     2 stents; + smoker  . Hypertension Father   . Thyroid disease Brother   . Diabetes Maternal Grandmother   . Cirrhosis Paternal Grandmother   . Alcohol abuse Paternal Grandmother   . Breast cancer      Paternal great aunt(obesity, smoker, EtOHic)  . Breast cancer      Maternal great aunt (deceased in 60's from same)  . Thyroid disease Other     cousins and grandmother  . Cancer Other     great aunt - lung  . Liver disease Paternal Aunt   . Cancer Cousin     liver, bile duct    The following portions of the patient's history were reviewed and updated as appropriate: allergies, current medications, past family history, past medical history, past social history, past surgical history and problem list.  Review of Systems Pertinent items are noted in HPI.   Objective:  BP 134/95 mmHg  Pulse 102  Ht 5\' 5"  (1.651 m)  Wt 236 lb (107.049 kg)  BMI 39.27 kg/m2 CONSTITUTIONAL: Well-developed, well-nourished female in no acute distress.  HENT:  Normocephalic, atraumatic, External right and left ear normal. Oropharynx is clear and moist EYES: Conjunctivae and EOM are normal. Pupils are equal, round, and reactive to light. No scleral icterus.  NECK: Normal range of motion, supple, no masses.  Normal thyroid.  SKIN: Skin is warm and dry. No rash noted. Not diaphoretic. No erythema. No pallor. [place]: Alert and oriented to person, place, and time. Normal reflexes, muscle tone coordination. No cranial nerve deficit noted. PSYCHIATRIC: Normal mood and affect. Normal behavior. Normal judgment and thought content. CARDIOVASCULAR: Normal heart rate noted, regular rhythm RESPIRATORY: Clear to auscultation bilaterally. Effort and breath sounds normal, no problems with respiration noted. BREASTS: Symmetric in size. No masses, skin changes, nipple drainage, or lymphadenopathy. ABDOMEN: Soft, normal bowel sounds, no distention noted.  No tenderness, rebound or  guarding.  PELVIC: Normal appearing external genitalia; normal appearing vaginal mucosa and cervix.  No abnormal discharge noted.  Pap smear obtained.  Normal uterine size, no other palpable masses, no uterine or adnexal tenderness. MUSCULOSKELETAL: Normal range of motion. No tenderness.  No cyanosis, clubbing, or edema.  2+ distal pulses.   Assessment:  Annual gynecologic examination with pap smear Anxiety   Plan:  Will follow up results of pap smear and manage accordingly. Mammogram scheduled Routine preventative health maintenance measures emphasized. Please refer to After  Visit Summary for other counseling recommendations.  Zoloft 25 mg PO RTO in 4 weeks for med recheck RTO in 1 year for annual exam Schedule screening mammogram  [place] for [facility]

## 2015-05-25 NOTE — Addendum Note (Signed)
Addended by: Lin Landsman C on: 05/25/2015 03:42 PM   Modules accepted: Orders

## 2015-05-25 NOTE — Patient Instructions (Signed)
Sertraline tablets What is this medicine? SERTRALINE (SER tra leen) is used to treat depression. It may also be used to treat obsessive compulsive disorder, panic disorder, post-trauma stress, premenstrual dysphoric disorder (PMDD) or social anxiety. This medicine may be used for other purposes; ask your health care provider or pharmacist if you have questions. What should I tell my health care provider before I take this medicine? They need to know if you have any of these conditions: -bipolar disorder or a family history of bipolar disorder -diabetes -glaucoma -heart disease -high blood pressure -history of irregular heartbeat -history of low levels of calcium, magnesium, or potassium in the blood -if you often drink alcohol -liver disease -receiving electroconvulsive therapy -seizures -suicidal thoughts, plans, or attempt; a previous suicide attempt by you or a family member -thyroid disease -an unusual or allergic reaction to sertraline, other medicines, foods, dyes, or preservatives -pregnant or trying to get pregnant -breast-feeding How should I use this medicine? Take this medicine by mouth with a glass of water. Follow the directions on the prescription label. You can take it with or without food. Take your medicine at regular intervals. Do not take your medicine more often than directed. Do not stop taking this medicine suddenly except upon the advice of your doctor. Stopping this medicine too quickly may cause serious side effects or your condition may worsen. A special MedGuide will be given to you by the pharmacist with each prescription and refill. Be sure to read this information carefully each time. Talk to your pediatrician regarding the use of this medicine in children. While this drug may be prescribed for children as young as 7 years for selected conditions, precautions do apply. Overdosage: If you think you have taken too much of this medicine contact a poison control  center or emergency room at once. NOTE: This medicine is only for you. Do not share this medicine with others. What if I miss a dose? If you miss a dose, take it as soon as you can. If it is almost time for your next dose, take only that dose. Do not take double or extra doses. What may interact with this medicine? Do not take this medicine with any of the following medications: -certain medicines for fungal infections like fluconazole, itraconazole, ketoconazole, posaconazole, voriconazole -cisapride -disulfiram -dofetilide -linezolid -MAOIs like Carbex, Eldepryl, Marplan, Nardil, and Parnate -metronidazole -methylene blue (injected into a vein) -pimozide -thioridazine -ziprasidone This medicine may also interact with the following medications: -alcohol -aspirin and aspirin-like medicines -certain medicines for depression, anxiety, or psychotic disturbances -certain medicines for irregular heart beat like flecainide, propafenone -certain medicines for migraine headaches like almotriptan, eletriptan, frovatriptan, naratriptan, rizatriptan, sumatriptan, zolmitriptan -certain medicines for sleep -certain medicines for seizures like carbamazepine, valproic acid, phenytoin -certain medicines that treat or prevent blood clots like warfarin, enoxaparin, dalteparin -cimetidine -digoxin -diuretics -fentanyl -furazolidone -isoniazid -lithium -NSAIDs, medicines for pain and inflammation, like ibuprofen or naproxen -other medicines that prolong the QT interval (cause an abnormal heart rhythm) -procarbazine -rasagiline -supplements like St. John's wort, kava kava, valerian -tolbutamide -tramadol -tryptophan This list may not describe all possible interactions. Give your health care provider a list of all the medicines, herbs, non-prescription drugs, or dietary supplements you use. Also tell them if you smoke, drink alcohol, or use illegal drugs. Some items may interact with your  medicine. What should I watch for while using this medicine? Tell your doctor if your symptoms do not get better or if they get worse. Visit your doctor   or health care professional for regular checks on your progress. Because it may take several weeks to see the full effects of this medicine, it is important to continue your treatment as prescribed by your doctor. Patients and their families should watch out for new or worsening thoughts of suicide or depression. Also watch out for sudden changes in feelings such as feeling anxious, agitated, panicky, irritable, hostile, aggressive, impulsive, severely restless, overly excited and hyperactive, or not being able to sleep. If this happens, especially at the beginning of treatment or after a change in dose, call your health care professional. You may get drowsy or dizzy. Do not drive, use machinery, or do anything that needs mental alertness until you know how this medicine affects you. Do not stand or sit up quickly, especially if you are an older patient. This reduces the risk of dizzy or fainting spells. Alcohol may interfere with the effect of this medicine. Avoid alcoholic drinks. Your mouth may get dry. Chewing sugarless gum or sucking hard candy, and drinking plenty of water may help. Contact your doctor if the problem does not go away or is severe. What side effects may I notice from receiving this medicine? Side effects that you should report to your doctor or health care professional as soon as possible: -allergic reactions like skin rash, itching or hives, swelling of the face, lips, or tongue -black or bloody stools, blood in the urine or vomit -fast, irregular heartbeat -feeling faint or lightheaded, falls -hallucination, loss of contact with reality -seizures -suicidal thoughts or other mood changes -unusual bleeding or bruising -unusually weak or tired -vomiting Side effects that usually do not require medical attention (report to your  doctor or health care professional if they continue or are bothersome): -change in appetite -change in sex drive or performance -diarrhea -increased sweating -indigestion, nausea -tremors This list may not describe all possible side effects. Call your doctor for medical advice about side effects. You may report side effects to FDA at 1-800-FDA-1088. Where should I keep my medicine? Keep out of the reach of children. Store at room temperature between 15 and 30 degrees C (59 and 86 degrees F). Throw away any unused medicine after the expiration date. NOTE: This sheet is a summary. It may not cover all possible information. If you have questions about this medicine, talk to your doctor, pharmacist, or health care provider.    2016, Elsevier/Gold Standard. (2013-01-27 12:57:35)  

## 2015-05-25 NOTE — Addendum Note (Signed)
Addended by: Lin Landsman C on: 05/25/2015 03:12 PM   Modules accepted: Orders

## 2015-05-26 ENCOUNTER — Telehealth: Payer: Self-pay | Admitting: Family Medicine

## 2015-05-26 ENCOUNTER — Other Ambulatory Visit: Payer: BC Managed Care – PPO

## 2015-05-26 NOTE — Telephone Encounter (Signed)
Received request for caption telephone service. No known hearing loss, I don't know anything about this. Can we call and ask pt about this?  Placed in [place]' box.

## 2015-05-26 NOTE — Telephone Encounter (Signed)
Message left for patient to return call and advise the necessity of service and what kind of hearing loss she has since we've never evaluated her for it.

## 2015-05-27 ENCOUNTER — Other Ambulatory Visit (INDEPENDENT_AMBULATORY_CARE_PROVIDER_SITE_OTHER): Payer: BC Managed Care – PPO

## 2015-05-27 ENCOUNTER — Encounter: Payer: Self-pay | Admitting: *Deleted

## 2015-05-27 DIAGNOSIS — D509 Iron deficiency anemia, unspecified: Secondary | ICD-10-CM | POA: Diagnosis not present

## 2015-05-27 DIAGNOSIS — I10 Essential (primary) hypertension: Secondary | ICD-10-CM | POA: Diagnosis not present

## 2015-05-27 DIAGNOSIS — Z9884 Bariatric surgery status: Secondary | ICD-10-CM | POA: Diagnosis not present

## 2015-05-27 DIAGNOSIS — K909 Intestinal malabsorption, unspecified: Secondary | ICD-10-CM | POA: Diagnosis not present

## 2015-05-27 LAB — IBC PANEL
Iron: 102 ug/dL (ref 42–145)
Saturation Ratios: 24.3 % (ref 20.0–50.0)
Transferrin: 300 mg/dL (ref 212.0–360.0)

## 2015-05-27 LAB — FERRITIN: FERRITIN: 15.7 ng/mL (ref 10.0–291.0)

## 2015-05-27 LAB — LIPID PANEL
CHOLESTEROL: 193 mg/dL (ref 0–200)
HDL: 55.6 mg/dL (ref >39.00)
LDL Cholesterol: 121 mg/dL — ABNORMAL HIGH (ref 0–99)
NonHDL: 136.9
TRIGLYCERIDES: 81 mg/dL (ref 0.0–149.0)
Total CHOL/HDL Ratio: 3
VLDL: 16.2 mg/dL (ref 0.0–40.0)

## 2015-05-27 LAB — BASIC METABOLIC PANEL
BUN: 10 mg/dL (ref 6–23)
CALCIUM: 9.1 mg/dL (ref 8.4–10.5)
CHLORIDE: 105 meq/L (ref 96–112)
CO2: 28 meq/L (ref 19–32)
CREATININE: 0.84 mg/dL (ref 0.40–1.20)
GFR: 93.69 mL/min (ref >60.00)
GLUCOSE: 108 mg/dL — AB (ref 70–99)
Potassium: 3.8 mEq/L (ref 3.5–5.1)
Sodium: 140 mEq/L (ref 135–145)

## 2015-05-27 LAB — CBC WITH DIFFERENTIAL/PLATELET
BASOS ABS: 0 10*3/uL (ref 0.0–0.1)
Basophils Relative: 0.2 % (ref 0.0–3.0)
EOS ABS: 0.2 10*3/uL (ref 0.0–0.7)
Eosinophils Relative: 2 % (ref 0.0–5.0)
HCT: 44.3 % (ref 36.0–46.0)
Hemoglobin: 14.6 g/dL (ref 12.0–15.0)
LYMPHS ABS: 1.2 10*3/uL (ref 0.7–4.0)
LYMPHS PCT: 13.7 % (ref 12.0–46.0)
MCHC: 32.9 g/dL (ref 30.0–36.0)
MCV: 86.4 fl (ref 78.0–100.0)
MONO ABS: 0.7 10*3/uL (ref 0.1–1.0)
Monocytes Relative: 7.4 % (ref 3.0–12.0)
NEUTROS ABS: 7 10*3/uL (ref 1.4–7.7)
NEUTROS PCT: 76.7 % (ref 43.0–77.0)
PLATELETS: 331 10*3/uL (ref 150.0–400.0)
RBC: 5.13 Mil/uL — ABNORMAL HIGH (ref 3.87–5.11)
RDW: 13.6 % (ref 11.5–15.5)
WBC: 9.1 10*3/uL (ref 4.0–10.5)

## 2015-05-27 LAB — VITAMIN D 25 HYDROXY (VIT D DEFICIENCY, FRACTURES): VITD: 28.01 ng/mL — AB (ref 30.00–100.00)

## 2015-05-27 LAB — TSH: TSH: 1.03 u[IU]/mL (ref 0.35–4.50)

## 2015-05-27 LAB — FOLATE: FOLATE: 21.4 ng/mL (ref >5.9)

## 2015-05-27 NOTE — Telephone Encounter (Signed)
Patient left note for Maudie Mercury stating that she has appt with Dr. Danise Mina on 06/07/15, and she will just discuss everything in more detail with him then. She states that she will hold off on paperwork until her appt, and will set up a hearing evaluation.

## 2015-05-30 LAB — CYTOLOGY - PAP

## 2015-06-01 LAB — VITAMIN B1: Vitamin B1 (Thiamine): 9 nmol/L (ref 8–30)

## 2015-06-02 ENCOUNTER — Ambulatory Visit: Payer: BC Managed Care – PPO | Admitting: Obstetrics and Gynecology

## 2015-06-02 ENCOUNTER — Encounter: Payer: BC Managed Care – PPO | Admitting: Family Medicine

## 2015-06-07 ENCOUNTER — Encounter (INDEPENDENT_AMBULATORY_CARE_PROVIDER_SITE_OTHER): Payer: Self-pay

## 2015-06-07 ENCOUNTER — Ambulatory Visit (INDEPENDENT_AMBULATORY_CARE_PROVIDER_SITE_OTHER): Payer: BC Managed Care – PPO | Admitting: Family Medicine

## 2015-06-07 ENCOUNTER — Encounter: Payer: Self-pay | Admitting: Family Medicine

## 2015-06-07 VITALS — BP 134/90 | HR 82 | Temp 97.6°F | Ht 65.16 in | Wt 231.1 lb

## 2015-06-07 DIAGNOSIS — I10 Essential (primary) hypertension: Secondary | ICD-10-CM | POA: Diagnosis not present

## 2015-06-07 DIAGNOSIS — Z0000 Encounter for general adult medical examination without abnormal findings: Secondary | ICD-10-CM

## 2015-06-07 DIAGNOSIS — E559 Vitamin D deficiency, unspecified: Secondary | ICD-10-CM | POA: Diagnosis not present

## 2015-06-07 DIAGNOSIS — D509 Iron deficiency anemia, unspecified: Secondary | ICD-10-CM

## 2015-06-07 DIAGNOSIS — K909 Intestinal malabsorption, unspecified: Secondary | ICD-10-CM

## 2015-06-07 DIAGNOSIS — Z9884 Bariatric surgery status: Secondary | ICD-10-CM

## 2015-06-07 DIAGNOSIS — E042 Nontoxic multinodular goiter: Secondary | ICD-10-CM

## 2015-06-07 NOTE — Progress Notes (Signed)
Pre visit review using our clinic review tool, if applicable. No additional management support is needed unless otherwise documented below in the visit note. 

## 2015-06-07 NOTE — Assessment & Plan Note (Signed)
Improving. S/p iron infusion 2014. Continue iron daily.

## 2015-06-07 NOTE — Assessment & Plan Note (Signed)
Reviewed recent vitamins. Start B complex vitamin.

## 2015-06-07 NOTE — Assessment & Plan Note (Signed)
Slowly improving on 1000 IU daily

## 2015-06-07 NOTE — Assessment & Plan Note (Signed)
Chronic, stable. Continue hctz. Missed today's dose.

## 2015-06-07 NOTE — Progress Notes (Signed)
BP 134/90 mmHg  Pulse 82  Temp(Src) 97.6 F (36.4 C) (Oral)  Ht 5' 5.16" (1.655 m)  Wt 231 lb 2 oz (104.838 kg)  BMI 38.28 kg/m2   CC: CPE  Subjective:    Patient ID: Theresa Pratt, female    DOB: May 27, 1971, 44 y.o.   MRN: [id number]  HPI: Theresa Pratt is a 44 y.o. female presenting on 06/07/2015 for Annual Exam   Mother recently dx with uterine cancer. ?anxiety - OBGYN started zoloft 25mg  daily.  Feels this is helpful, more focused, has helped decrease anxiety eating.   Mom requests referral for ADHD eval for son Wille Glaser today.  Preventative: [facility] - [facility]. Saw last week.  Mammogram scheduled for 06/2015 Flu shot yearly Tetanus shot - 02/2009 Seat belt use discussed No changing moles on skin.  Lives with parents, husband, 2 children and small dog  Occupation: Currently in school getting Master's in [place] at [employer]. Also working full time in admin at [name]  Activity: no regular exercise  Diet: good water, fruits/vegetables daily, does like juice   Relevant past medical, surgical, family and social history reviewed and updated as indicated. Interim medical history since our last visit reviewed. Allergies and medications reviewed and updated. Current Outpatient Prescriptions on File Prior to Visit  Medication Sig  . cholecalciferol (VITAMIN D) 1000 UNITS tablet Take 1,000 Units by mouth daily.  . hydrochlorothiazide (HYDRODIURIL) 12.5 MG tablet Take 1 tablet (12.5 mg total) by mouth daily.  . Iron Combinations TABS Take 1 tablet by mouth daily.  Marland Kitchen levonorgestrel (MIRENA) 20 MCG/24HR IUD 1 each by Intrauterine route once.  . Multiple Vitamin (MULTIVITAMIN) tablet Take 1 tablet by mouth daily.    . sertraline (ZOLOFT) 25 MG tablet Take 1 tablet (25 mg total) by mouth at bedtime.  . gabapentin (NEURONTIN) 100 MG capsule TAKE 1 CAPSULE (100 MG TOTAL) BY MOUTH AT BEDTIME. (Patient not taking: Reported on 06/07/2015)  . ranitidine  (ZANTAC) 150 MG tablet Take 150 mg by mouth 2 (two) times daily.   No current facility-administered medications on file prior to visit.    Review of Systems  Constitutional: Negative for fever, chills, activity change, appetite change, fatigue and unexpected weight change.  HENT: Negative for hearing loss.   Eyes: Negative for visual disturbance.  Respiratory: Negative for cough, chest tightness, shortness of breath and wheezing.   Cardiovascular: Negative for chest pain, palpitations and leg swelling.  Gastrointestinal: Positive for diarrhea (intermittent since weight loss surgery). Negative for nausea, vomiting, abdominal pain, constipation, blood in stool and abdominal distention.  Genitourinary: Negative for hematuria and difficulty urinating.  Musculoskeletal: Negative for myalgias, arthralgias and neck pain.  Skin: Negative for rash.  Neurological: Negative for dizziness, seizures, syncope and headaches.  Hematological: Negative for adenopathy. Does not bruise/bleed easily.  Psychiatric/Behavioral: Negative for dysphoric mood. The patient is nervous/anxious.    Per HPI unless specifically indicated in ROS section     Objective:    BP 134/90 mmHg  Pulse 82  Temp(Src) 97.6 F (36.4 C) (Oral)  Ht 5' 5.16" (1.655 m)  Wt 231 lb 2 oz (104.838 kg)  BMI 38.28 kg/m2  Wt Readings from Last 3 Encounters:  06/07/15 231 lb 2 oz (104.838 kg)  05/25/15 236 lb (107.049 kg)  04/05/15 235 lb (106.595 kg)    Physical Exam  Constitutional: She is oriented to person, place, and time. She appears well-developed and well-nourished. No distress.  HENT:  Head: Normocephalic and  atraumatic.  Right Ear: Hearing, tympanic membrane, external ear and ear canal normal.  Left Ear: Hearing, tympanic membrane, external ear and ear canal normal.  Nose: Nose normal.  Mouth/Throat: Uvula is midline, oropharynx is clear and moist and mucous membranes are normal. No oropharyngeal exudate, posterior  oropharyngeal edema or posterior oropharyngeal erythema.  Eyes: Conjunctivae and EOM are normal. Pupils are equal, round, and reactive to light. No scleral icterus.  Neck: Normal range of motion. Neck supple. No thyromegaly present.  Cardiovascular: Normal rate, regular rhythm, normal heart sounds and intact distal pulses.   No murmur heard. Pulses:      Radial pulses are 2+ on the right side, and 2+ on the left side.  Pulmonary/Chest: Effort normal and breath sounds normal. No respiratory distress. She has no wheezes. She has no rales.  Abdominal: Soft. Bowel sounds are normal. She exhibits no distension and no mass. There is no tenderness. There is no rebound and no guarding.  Musculoskeletal: Normal range of motion. She exhibits no edema.  Lymphadenopathy:    She has no cervical adenopathy.  Neurological: She is alert and oriented to person, place, and time.  CN grossly intact, station and gait intact  Skin: Skin is warm and dry. No rash noted.  Psychiatric: She has a normal mood and affect. Her behavior is normal. Judgment and thought content normal.  Nursing note and vitals reviewed.  Results for orders placed or performed in visit on 05/27/15  Lipid panel  Result Value Ref Range   Cholesterol 193 0 - 200 mg/dL   Triglycerides 81.0 0.0 - 149.0 mg/dL   HDL 55.60 >39.00 mg/dL   VLDL 16.2 0.0 - 40.0 mg/dL   LDL Cholesterol 121 (H) 0 - 99 mg/dL   Total CHOL/HDL Ratio 3    NonHDL [id number]   Basic metabolic panel  Result Value Ref Range   Sodium 140 135 - 145 mEq/L   Potassium 3.8 3.5 - 5.1 mEq/L   Chloride 105 96 - 112 mEq/L   CO2 28 19 - 32 mEq/L   Glucose, Bld 108 (H) 70 - 99 mg/dL   BUN 10 6 - 23 mg/dL   Creatinine, Ser 0.84 0.40 - 1.20 mg/dL   Calcium 9.1 8.4 - 10.5 mg/dL   GFR 93.69 >60.00 mL/min  TSH  Result Value Ref Range   TSH 1.03 0.35 - 4.50 uIU/mL  CBC with Differential/Platelet  Result Value Ref Range   WBC 9.1 4.0 - 10.5 K/uL   RBC 5.13 (H) 3.87 - 5.11 Mil/uL     Hemoglobin 14.6 12.0 - 15.0 g/dL   HCT 44.3 36.0 - 46.0 %   MCV 86.4 78.0 - 100.0 fl   MCHC 32.9 30.0 - 36.0 g/dL   RDW 13.6 11.5 - 15.5 %   Platelets 331.0 150.0 - 400.0 K/uL   Neutrophils Relative % 76.7 43.0 - 77.0 %   Lymphocytes Relative 13.7 12.0 - 46.0 %   Monocytes Relative 7.4 3.0 - 12.0 %   Eosinophils Relative 2.0 0.0 - 5.0 %   Basophils Relative 0.2 0.0 - 3.0 %   Neutro Abs 7.0 1.4 - 7.7 K/uL   Lymphs Abs 1.2 0.7 - 4.0 K/uL   Monocytes Absolute 0.7 0.1 - 1.0 K/uL   Eosinophils Absolute 0.2 0.0 - 0.7 K/uL   Basophils Absolute 0.0 0.0 - 0.1 K/uL  Vit D  25 hydroxy (rtn osteoporosis monitoring)  Result Value Ref Range   VITD 28.01 (L) 30.00 - 100.00 ng/mL  Vitamin  B1  Result Value Ref Range   Vitamin B1 (Thiamine) 9 8 - 30 nmol/L  Folate  Result Value Ref Range   Folate 21.4 >5.9 ng/mL  Ferritin  Result Value Ref Range   Ferritin 15.7 10.0 - 291.0 ng/mL  IBC panel  Result Value Ref Range   Iron 102 42 - 145 ug/dL   Transferrin 300.0 212.0 - 360.0 mg/dL   Saturation Ratios 24.3 20.0 - 50.0 %      Assessment & Plan:   Problem List Items Addressed This Visit    Vitamin D deficiency    Slowly improving on 1000 IU daily      Nontoxic multinodular goiter    Denies change in size, dysphagia or other concerns. Continue to monitor. Consider rpt [place] in next 12 months.      Iron malabsorption   Iron deficiency anemia    Improving. S/p iron infusion 2014. Continue iron daily.       Healthcare maintenance - Primary    Preventative protocols reviewed and updated unless pt declined. Discussed healthy diet and lifestyle.       Essential hypertension    Chronic, stable. Continue hctz. Missed today's dose.      Bariatric surgery status    Reviewed recent vitamins. Start B complex vitamin.          Follow up plan: Return in about 1 year (around 06/06/2016), or as needed, for annual exam, prior fasting for blood work.

## 2015-06-07 NOTE — Assessment & Plan Note (Signed)
Denies change in size, dysphagia or other concerns. Continue to monitor. Consider rpt [place] in next 12 months.

## 2015-06-07 NOTE — Assessment & Plan Note (Signed)
Preventative protocols reviewed and updated unless pt declined. Discussed healthy diet and lifestyle.  

## 2015-06-07 NOTE — Patient Instructions (Addendum)
Nice to see you today, call us with questions. Return as needed or in 1 year for next physical.  Health Maintenance, Female Adopting a healthy lifestyle and getting preventive care can go a long way to promote health and wellness. Talk with your health care provider about what schedule of regular examinations is right for you. This is a good chance for you to check in with your provider about disease prevention and staying healthy. In between checkups, there are plenty of things you can do on your own. Experts have done a lot of research about which lifestyle changes and preventive measures are most likely to keep you healthy. Ask your health care provider for more information. WEIGHT AND DIET  Eat a healthy diet  Be sure to include plenty of vegetables, fruits, low-fat dairy products, and lean protein.  Do not eat a lot of foods high in solid fats, added sugars, or salt.  Get regular exercise. This is one of the most important things you can do for your health.  Most adults should exercise for at least 150 minutes each week. The exercise should increase your heart rate and make you sweat (moderate-intensity exercise).  Most adults should also do strengthening exercises at least twice a week. This is in addition to the moderate-intensity exercise.  Maintain a healthy weight  Body mass index (BMI) is a measurement that can be used to identify possible weight problems. It estimates body fat based on height and weight. Your health care provider can help determine your BMI and help you achieve or maintain a healthy weight.  For females 31 years of age and older:   A BMI below 18.5 is considered underweight.  A BMI of 18.5 to 24.9 is normal.  A BMI of 25 to 29.9 is considered overweight.  A BMI of 30 and above is considered obese.  Watch levels of cholesterol and blood lipids  You should start having your blood tested for lipids and cholesterol at 46 years of age, then have this test  every 5 years.  You may need to have your cholesterol levels checked more often if:  Your lipid or cholesterol levels are high.  You are older than 46 years of age.  You are at high risk for heart disease.  CANCER SCREENING   Lung Cancer  Lung cancer screening is recommended for adults 42-57 years old who are at high risk for lung cancer because of a history of smoking.  A yearly low-dose CT scan of the lungs is recommended for people who:  Currently smoke.  Have quit within the past 15 years.  Have at least a 30-pack-year history of smoking. A pack year is smoking an average of one pack of cigarettes a day for 1 year.  Yearly screening should continue until it has been 15 years since you quit.  Yearly screening should stop if you develop a health problem that would prevent you from having lung cancer treatment.  Breast Cancer  Practice breast self-awareness. This means understanding how your breasts normally appear and feel.  It also means doing regular breast self-exams. Let your health care provider know about any changes, no matter how small.  If you are in your 20s or 30s, you should have a clinical breast exam (CBE) by a health care provider every 1-3 years as part of a regular health exam.  If you are 39 or older, have a CBE every year. Also consider having a breast X-ray (mammogram) every year.  If  you have a family history of breast cancer, talk to your health care provider about genetic screening.  If you are at high risk for breast cancer, talk to your health care provider about having an MRI and a mammogram every year.  Breast cancer gene (BRCA) assessment is recommended for women who have family members with BRCA-related cancers. BRCA-related cancers include:  Breast.  Ovarian.  Tubal.  Peritoneal cancers.  Results of the assessment will determine the need for genetic counseling and BRCA1 and BRCA2 testing. Cervical Cancer Your health care provider  may recommend that you be screened regularly for cancer of the pelvic organs (ovaries, uterus, and vagina). This screening involves a pelvic examination, including checking for microscopic changes to the surface of your cervix (Pap test). You may be encouraged to have this screening done every 3 years, beginning at age 56.  For women ages 51-65, health care providers may recommend pelvic exams and Pap testing every 3 years, or they may recommend the Pap and pelvic exam, combined with testing for human papilloma virus (HPV), every 5 years. Some types of HPV increase your risk of cervical cancer. Testing for HPV may also be done on women of any age with unclear Pap test results.  Other health care providers may not recommend any screening for nonpregnant women who are considered low risk for pelvic cancer and who do not have symptoms. Ask your health care provider if a screening pelvic exam is right for you.  If you have had past treatment for cervical cancer or a condition that could lead to cancer, you need Pap tests and screening for cancer for at least 20 years after your treatment. If Pap tests have been discontinued, your risk factors (such as having a new sexual partner) need to be reassessed to determine if screening should resume. Some women have medical problems that increase the chance of getting cervical cancer. In these cases, your health care provider may recommend more frequent screening and Pap tests. Colorectal Cancer  This type of cancer can be detected and often prevented.  Routine colorectal cancer screening usually begins at 46 years of age and continues through 46 years of age.  Your health care provider may recommend screening at an earlier age if you have risk factors for colon cancer.  Your health care provider may also recommend using home test kits to check for hidden blood in the stool.  A small camera at the end of a tube can be used to examine your colon directly  (sigmoidoscopy or colonoscopy). This is done to check for the earliest forms of colorectal cancer.  Routine screening usually begins at age 65.  Direct examination of the colon should be repeated every 5-10 years through 46 years of age. However, you may need to be screened more often if early forms of precancerous polyps or small growths are found. Skin Cancer  Check your skin from head to toe regularly.  Tell your health care provider about any new moles or changes in moles, especially if there is a change in a mole's shape or color.  Also tell your health care provider if you have a mole that is larger than the size of a pencil eraser.  Always use sunscreen. Apply sunscreen liberally and repeatedly throughout the day.  Protect yourself by wearing long sleeves, pants, a wide-brimmed hat, and sunglasses whenever you are outside. HEART DISEASE, DIABETES, AND HIGH BLOOD PRESSURE   High blood pressure causes heart disease and increases the risk of  stroke. High blood pressure is more likely to develop in:  People who have blood pressure in the high end of the normal range (130-139/85-89 mm Hg).  People who are overweight or obese.  People who are African American.  If you are 9-32 years of age, have your blood pressure checked every 3-5 years. If you are 6 years of age or older, have your blood pressure checked every year. You should have your blood pressure measured twice--once when you are at a hospital or clinic, and once when you are not at a hospital or clinic. Record the average of the two measurements. To check your blood pressure when you are not at a hospital or clinic, you can use:  An automated blood pressure machine at a pharmacy.  A home blood pressure monitor.  If you are between 44 years and 43 years old, ask your health care provider if you should take aspirin to prevent strokes.  Have regular diabetes screenings. This involves taking a blood sample to check your  fasting blood sugar level.  If you are at a normal weight and have a low risk for diabetes, have this test once every three years after 46 years of age.  If you are overweight and have a high risk for diabetes, consider being tested at a younger age or more often. PREVENTING INFECTION  Hepatitis B  If you have a higher risk for hepatitis B, you should be screened for this virus. You are considered at high risk for hepatitis B if:  You were born in a country where hepatitis B is common. Ask your health care provider which countries are considered high risk.  Your parents were born in a high-risk country, and you have not been immunized against hepatitis B (hepatitis B vaccine).  You have HIV or AIDS.  You use needles to inject street drugs.  You live with someone who has hepatitis B.  You have had sex with someone who has hepatitis B.  You get hemodialysis treatment.  You take certain medicines for conditions, including cancer, organ transplantation, and autoimmune conditions. Hepatitis C  Blood testing is recommended for:  Everyone born from [birth year] through 1967.  Anyone with known risk factors for hepatitis C. Sexually transmitted infections (STIs)  You should be screened for sexually transmitted infections (STIs) including gonorrhea and chlamydia if:  You are sexually active and are younger than 46 years of age.  You are older than 45 years of age and your health care provider tells you that you are at risk for this type of infection.  Your sexual activity has changed since you were last screened and you are at an increased risk for chlamydia or gonorrhea. Ask your health care provider if you are at risk.  If you do not have HIV, but are at risk, it may be recommended that you take a prescription medicine daily to prevent HIV infection. This is called pre-exposure prophylaxis (PrEP). You are considered at risk if:  You are sexually active and do not regularly use condoms or  know the HIV status of your partner(s).  You take drugs by injection.  You are sexually active with a partner who has HIV. Talk with your health care provider about whether you are at high risk of being infected with HIV. If you choose to begin PrEP, you should first be tested for HIV. You should then be tested every 3 months for as long as you are taking PrEP.  PREGNANCY   If  you are premenopausal and you may become pregnant, ask your health care provider about preconception counseling.  If you may become pregnant, take 400 to 800 micrograms (mcg) of folic acid every day.  If you want to prevent pregnancy, talk to your health care provider about birth control (contraception). OSTEOPOROSIS AND MENOPAUSE   Osteoporosis is a disease in which the bones lose minerals and strength with aging. This can result in serious bone fractures. Your risk for osteoporosis can be identified using a bone density scan.  If you are 83 years of age or older, or if you are at risk for osteoporosis and fractures, ask your health care provider if you should be screened.  Ask your health care provider whether you should take a calcium or vitamin D supplement to lower your risk for osteoporosis.  Menopause may have certain physical symptoms and risks.  Hormone replacement therapy may reduce some of these symptoms and risks. Talk to your health care provider about whether hormone replacement therapy is right for you.  HOME CARE INSTRUCTIONS   Schedule regular health, dental, and eye exams.  Stay current with your immunizations.   Do not use any tobacco products including cigarettes, chewing tobacco, or electronic cigarettes.  If you are pregnant, do not drink alcohol.  If you are breastfeeding, limit how much and how often you drink alcohol.  Limit alcohol intake to no more than 1 drink per day for nonpregnant women. One drink equals 12 ounces of beer, 5 ounces of wine, or 1 ounces of hard liquor.  Do  not use street drugs.  Do not share needles.  Ask your health care provider for help if you need support or information about quitting drugs.  Tell your health care provider if you often feel depressed.  Tell your health care provider if you have ever been abused or do not feel safe at home.   This information is not intended to replace advice given to you by your health care provider. Make sure you discuss any questions you have with your health care provider.   Document Released: 01/15/2011 Document Revised: 07/23/2014 Document Reviewed: 06/03/2013 Elsevier Interactive Patient Education [employer].

## 2015-06-27 ENCOUNTER — Ambulatory Visit
Admission: RE | Admit: 2015-06-27 | Discharge: 2015-06-27 | Disposition: A | Discharge status: Home / Self Care | Payer: BC Managed Care – PPO | Source: Ambulatory Visit | Attending: Obstetrics & Gynecology | Admitting: Obstetrics & Gynecology

## 2015-06-27 ENCOUNTER — Other Ambulatory Visit: Payer: BC Managed Care – PPO

## 2015-06-27 DIAGNOSIS — N6320 Unspecified lump in the left breast, unspecified quadrant: Secondary | ICD-10-CM

## 2015-06-29 ENCOUNTER — Encounter: Payer: Self-pay | Admitting: Certified Nurse Midwife

## 2015-06-29 ENCOUNTER — Ambulatory Visit (INDEPENDENT_AMBULATORY_CARE_PROVIDER_SITE_OTHER): Payer: BC Managed Care – PPO | Admitting: Certified Nurse Midwife

## 2015-06-29 VITALS — BP 133/95 | HR 93 | Wt 237.0 lb

## 2015-06-29 DIAGNOSIS — F419 Anxiety disorder, unspecified: Secondary | ICD-10-CM | POA: Diagnosis not present

## 2015-06-29 DIAGNOSIS — F418 Other specified anxiety disorders: Secondary | ICD-10-CM | POA: Insufficient documentation

## 2015-06-29 NOTE — Progress Notes (Signed)
CC: Follow-up    None     HPI Theresa Pratt is a 44 y.o. [id number] Unknown who presents for Zoloft 4 week check. Pt is definitely feeling better but feels she mad need an increase in her medicine  Past Medical History  Diagnosis Date  . Morbid obesity ([place])     Pre gastric bypass  . HTN (hypertension)     Pre gastric bypass  . DM (diabetes mellitus) ([place])     pre gastric bypass  . Arthropathy     weight bearing (pre gastric bypass)  . Oligomenorrhea     on Implanon (pre gastric bypass)  . Allergic rhinitis     post gastric bypass  . HTN (hypertension)     post gastric bypass  . GERD (gastroesophageal reflux disease)     post gastric bypass  . Pre-eclampsia     post gastric bypass  . Postpartum depression     post gastric bypass  . IDA (iron deficiency anemia)     post gastric bypass (iron infusion by hematology-doesn't build stores -Dr. Humphrey Rolls)  . 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 Rectal condyloma removal  2006  . Implanon placed  03/16/09    (3 years)  . Cholecystectomy  08/2010    Social History   Social History  . Marital Status: Married    Spouse Name: N/A  . Number of Children: 2  . Years of Education: N/A   Occupational History  . [employer] Dept Head    Social History Main Topics  . Smoking status: Never Smoker   . Smokeless tobacco: Never Used  . Alcohol Use: Yes     Comment: occasional  . Drug Use: No  . Sexual Activity:    Partners: Male    Birth Control/ Protection: None   Other Topics Concern  . 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  Wants to be substance abuse counselor   Activity: no regular exercise   Diet: good water, fruits/vegetables daily, does like juice    Current Outpatient Prescriptions on File Prior to Visit  Medication Sig Dispense Refill  . cholecalciferol (VITAMIN D) 1000 UNITS tablet Take 1,000 Units by mouth daily.    Marland Kitchen gabapentin (NEURONTIN) 100 MG capsule TAKE 1 CAPSULE (100 MG TOTAL) BY MOUTH AT BEDTIME. (Patient not taking: Reported on 06/07/2015) 60 capsule 3  . hydrochlorothiazide (HYDRODIURIL) 12.5 MG tablet Take 1 tablet (12.5 mg total) by mouth daily. 90 tablet 0  . Iron Combinations TABS Take 1 tablet by mouth daily.    Marland Kitchen levonorgestrel (MIRENA) 20 MCG/24HR IUD 1 each by Intrauterine route once.    . Multiple Vitamin (MULTIVITAMIN) tablet Take 1 tablet by mouth daily.      . ranitidine (ZANTAC) 150 MG tablet  Take 150 mg by mouth 2 (two) times daily.    . sertraline (ZOLOFT) 25 MG tablet Take 1 tablet (25 mg total) by mouth at bedtime. 30 tablet 1   No current facility-administered medications on file prior to visit.    No Known Allergies   ROS Pertinent items in HPI   PHYSICAL EXAM Filed Vitals:   06/29/15 1446  BP: 133/95  Pulse: 93   General: Well nourished, well developed female in no acute distress Cardiovascular: Normal rate Respiratory: Normal effort Abdomen: Soft, nontender Back: No CVAT Extremities: No edema Neurologic:grossly intact Speculum exam: NEFG; vagina with physiologic discharge, no blood; cervix clean Bimanual exam: cervix closed, no CMT; uterus NSSP; no adnexal tenderness or masses   LAB RESULTS No results found for this or any previous visit (from the past 24 hour(s)).  IMAGING Mm Diag Breast Tomo Bilateral  06/27/2015  CLINICAL DATA:  The patient was asked to return for a probably benign finding within the left breast in 2012. She was seen at [facility]  imaging 06/30/2012. At that time, the probably benign left breast finding was stable. She now returns for follow-up evaluation and is asymptomatic. EXAM: DIGITAL DIAGNOSTIC BILATERAL MAMMOGRAM WITH 3D TOMOSYNTHESIS AND CAD COMPARISON:  07/20/2010 and 07/18/2010 mammograms. The mammograms from [place] from 2013 are not available for comparison at this time. ACR Breast Density Category a: The breast tissue is almost entirely fatty. FINDINGS: There is an oval, circumscribed mass located within the left breast at 12 o'clock position which is stable when compared with 07/18/2010 study consistent with a benign finding. The remainder of the breast parenchymal pattern is stable. There is no worrisome mass, distortion, or worrisome calcification within either breast. Mammographic images were processed with CAD. IMPRESSION: Stable benign-appearing mass located within the left breast at 12 o'clock position and stable breast parenchymal pattern bilaterally. Recommend return to screening mammography 1 year. RECOMMENDATION: Bilateral screening mammography in 1 year. I have discussed the findings and recommendations with the patient. Results were also provided in writing at the conclusion of the visit. If applicable, a reminder letter will be sent to the patient regarding the next appointment. BI-RADS CATEGORY  2: Benign. Electronically Signed   By: Altamese Cabal M.D.   On: 06/27/2015 16:59      ASSESSMENT  1. Anxiety   Increase Zoloft to 50mg  po every day  PLAN RTO prn   Medication List       This list is accurate as of: 06/29/15  2:57 PM.  Always use your most recent med list.               cholecalciferol 1000 UNITS tablet  Commonly known as:  VITAMIN D  Take 1,000 Units by mouth daily.     gabapentin 100 MG capsule  Commonly known as:  NEURONTIN  TAKE 1 CAPSULE (100 MG TOTAL) BY MOUTH AT BEDTIME.     hydrochlorothiazide 12.5 MG tablet  Commonly known as:  HYDRODIURIL  Take 1  tablet (12.5 mg total) by mouth daily.     Iron Combinations Tabs  Take 1 tablet by mouth daily.     levonorgestrel 20 MCG/24HR IUD  Commonly known as:  MIRENA  1 each by Intrauterine route once.     multivitamin tablet  Take 1 tablet by mouth daily.     ranitidine 150 MG tablet  Commonly known as:  ZANTAC  Take 150 mg by mouth 2 (two) times daily.     sertraline 25 MG tablet  Commonly known as:  ZOLOFT  Take 1 tablet (25 mg total) by mouth at bedtime.          Larey Days, CNM 06/29/2015 2:57 PM

## 2015-06-29 NOTE — Patient Instructions (Signed)
Generalized Anxiety Disorder Generalized anxiety disorder (GAD) is a mental disorder. It interferes with life functions, including relationships, work, and school. GAD is different from normal anxiety, which everyone experiences at some point in their lives in response to specific life events and activities. Normal anxiety actually helps us prepare for and get through these life events and activities. Normal anxiety goes away after the event or activity is over.  GAD causes anxiety that is not necessarily related to specific events or activities. It also causes excess anxiety in proportion to specific events or activities. The anxiety associated with GAD is also difficult to control. GAD can vary from mild to severe. People with severe GAD can have intense waves of anxiety with physical symptoms (panic attacks).  SYMPTOMS The anxiety and worry associated with GAD are difficult to control. This anxiety and worry are related to many life events and activities and also occur more days than not for 6 months or longer. People with GAD also have three or more of the following symptoms (one or more in children):  Restlessness.   Fatigue.  Difficulty concentrating.   Irritability.  Muscle tension.  Difficulty sleeping or unsatisfying sleep. DIAGNOSIS GAD is diagnosed through an assessment by your health care provider. Your health care provider will ask you questions aboutyour mood,physical symptoms, and events in your life. Your health care provider may ask you about your medical history and use of alcohol or drugs, including prescription medicines. Your health care provider may also do a physical exam and blood tests. Certain medical conditions and the use of certain substances can cause symptoms similar to those associated with GAD. Your health care provider may refer you to a mental health specialist for further evaluation. TREATMENT The following therapies are usually used to treat GAD:    Medication. Antidepressant medication usually is prescribed for long-term daily control. Antianxiety medicines may be added in severe cases, especially when panic attacks occur.   Talk therapy (psychotherapy). Certain types of talk therapy can be helpful in treating GAD by providing support, education, and guidance. A form of talk therapy called cognitive behavioral therapy can teach you healthy ways to think about and react to daily life events and activities.  Stress managementtechniques. These include yoga, meditation, and exercise and can be very helpful when they are practiced regularly. A mental health specialist can help determine which treatment is best for you. Some people see improvement with one therapy. However, other people require a combination of therapies.   This information is not intended to replace advice given to you by your health care provider. Make sure you discuss any questions you have with your health care provider.   Document Released: 10/27/2012 Document Revised: 07/23/2014 Document Reviewed: 10/27/2012 Elsevier Interactive Patient Education 2016 Elsevier Inc.  

## 2015-07-07 ENCOUNTER — Ambulatory Visit: Payer: BC Managed Care – PPO | Admitting: Obstetrics and Gynecology

## 2015-07-21 ENCOUNTER — Telehealth: Payer: Self-pay | Admitting: *Deleted

## 2015-07-21 DIAGNOSIS — F419 Anxiety disorder, unspecified: Secondary | ICD-10-CM

## 2015-07-21 MED ORDER — SERTRALINE HCL 50 MG PO TABS: 50.0000 mg | ORAL_TABLET | Freq: Every day | ORAL | Status: DC

## 2015-07-21 NOTE — Telephone Encounter (Signed)
Pt called requesting refill on Zoloft and requesting an increase to 50 mg.  Yvonne Kendall, CMN last note on 06-29-15 stated she could increase the Zoloft to 50mg  daily.  Sent new prescription to pharmacy.

## 2015-08-16 ENCOUNTER — Other Ambulatory Visit: Payer: Self-pay | Admitting: Certified Nurse Midwife

## 2015-12-01 ENCOUNTER — Telehealth: Payer: Self-pay | Admitting: Hematology and Oncology

## 2015-12-01 NOTE — Telephone Encounter (Signed)
cld pt and left a message and adv pt of appt time & date w/Dr Bettey Mare 5/31@2 :15-adv not new pt status

## 2015-12-08 ENCOUNTER — Encounter: Payer: Self-pay | Admitting: Primary Care

## 2015-12-08 ENCOUNTER — Ambulatory Visit (INDEPENDENT_AMBULATORY_CARE_PROVIDER_SITE_OTHER): Payer: BC Managed Care – PPO | Admitting: Primary Care

## 2015-12-08 VITALS — BP 134/88 | HR 72 | Temp 97.9°F | Ht 65.0 in | Wt 247.0 lb

## 2015-12-08 DIAGNOSIS — M545 Low back pain, unspecified: Secondary | ICD-10-CM

## 2015-12-08 DIAGNOSIS — M62830 Muscle spasm of back: Secondary | ICD-10-CM | POA: Diagnosis not present

## 2015-12-08 DIAGNOSIS — T148 Other injury of unspecified body region: Secondary | ICD-10-CM | POA: Diagnosis not present

## 2015-12-08 DIAGNOSIS — T148XXD Other injury of unspecified body region, subsequent encounter: Secondary | ICD-10-CM

## 2015-12-08 LAB — POCT GLUCOSE (DEVICE FOR HOME USE): POC Glucose: 87 mg/dl (ref 70–99)

## 2015-12-08 MED ORDER — MELOXICAM 15 MG PO TABS: 15.0000 mg | ORAL_TABLET | Freq: Every day | ORAL | Status: DC | PRN

## 2015-12-08 MED ORDER — CYCLOBENZAPRINE HCL 5 MG PO TABS: 5.0000 mg | ORAL_TABLET | Freq: Three times a day (TID) | ORAL | Status: DC | PRN

## 2015-12-08 NOTE — Progress Notes (Signed)
Pre visit review using our clinic review tool, if applicable. No additional management support is needed unless otherwise documented below in the visit note. 

## 2015-12-08 NOTE — Progress Notes (Signed)
Subjective:    Patient ID: Theresa Pratt, female    DOB: [birth year], 45 y.o.   MRN: [id number]  HPI  Theresa Pratt is a 45 year old female who presents today with a chief complaint of back pain. She also reports knee pain. Her symptoms have been present since her fall 1 week ago last Friday. She fell forward and landed on concrete hitting her bilateral knees and hands.   She's tired taking tylenol and aleve with temporary improvement in her knee pain but without improvement in her low back pain. She will experience spasms to her right lower back that have been ongoing since yesterday. She is on her feet all day for her occupation and has not had much rest since her fall. She's applied heat and has taken hot baths with temporary improvement. Denies numbness/tingling.   Review of Systems  Musculoskeletal: Positive for myalgias and back pain.  Skin: Positive for wound.  Neurological: Negative for numbness.       Past Medical History  Diagnosis Date  . Morbid obesity ([place])     Pre gastric bypass  . HTN (hypertension)     Pre gastric bypass  . DM (diabetes mellitus) ([place])     pre gastric bypass  . Arthropathy     weight bearing (pre gastric bypass)  . Oligomenorrhea     on Implanon (pre gastric bypass)  . Allergic rhinitis     post gastric bypass  . HTN (hypertension)     post gastric bypass  . GERD (gastroesophageal reflux disease)     post gastric bypass  . Pre-eclampsia     post gastric bypass  . Postpartum depression     post gastric bypass  . IDA (iron deficiency anemia)     post gastric bypass (iron infusion by hematology-doesn't build stores -Dr. Humphrey Rolls)  . 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 Marital Status: Married    Spouse Name: N/A  . Number of Children: 2  . Years of Education: N/A   Occupational History  . [employer] Dept Head    Social History Main Topics  . Smoking status: Never Smoker   .  Smokeless tobacco: Never Used  . Alcohol Use: Yes     Comment: occasional  . Drug Use: No  . Sexual Activity:    Partners: Male    Birth Control/ Protection: None   Other Topics Concern  . 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 Rectal condyloma removal  2006  . Implanon placed  03/16/09    (3 years)  . Cholecystectomy  08/2010    Family History  Problem Relation Age of Onset  . Coronary artery disease Father     2 stents; + smoker  . Hypertension Father   . Thyroid disease Brother   . Diabetes Maternal Grandmother   . Cirrhosis Paternal Grandmother   . Alcohol abuse Paternal Grandmother   . Breast cancer      Paternal  great aunt(obesity, smoker, EtOHic)  . Breast cancer      Maternal great aunt (deceased in 26's from same)  . Thyroid disease Other     cousins and grandmother  . Cancer Other     great aunt - lung  . Liver disease Paternal Aunt   . Cancer Cousin     liver, bile duct  . Cancer Mother     uterine    No Known Allergies  Current Outpatient Prescriptions on File Prior to Visit  Medication Sig Dispense Refill  . cholecalciferol (VITAMIN D) 1000 UNITS tablet Take 1,000 Units by mouth daily.    Marland Kitchen gabapentin (NEURONTIN) 100 MG capsule TAKE 1 CAPSULE (100 MG TOTAL) BY MOUTH AT BEDTIME. 60 capsule 3  . hydrochlorothiazide (HYDRODIURIL) 12.5 MG tablet Take 1 tablet (12.5 mg total) by mouth daily. 90 tablet 0  . Iron Combinations TABS Take 1 tablet by mouth daily.    Marland Kitchen levonorgestrel (MIRENA) 20 MCG/24HR IUD 1 each by Intrauterine route once.      . Multiple Vitamin (MULTIVITAMIN) tablet Take 1 tablet by mouth daily.      . sertraline (ZOLOFT) 50 MG tablet Take 1 tablet (50 mg total) by mouth daily. 30 tablet 4  . ranitidine (ZANTAC) 150 MG tablet Take 150 mg by mouth 2 (two) times daily. Reported on 12/08/2015     No current facility-administered medications on file prior to visit.    BP 134/88 mmHg  Pulse 72  Temp(Src) 97.9 F (36.6 C) (Oral)  Ht 5\' 5"  (1.651 m)  Wt 247 lb (112.038 kg)  BMI 41.10 kg/m2  SpO2 98%    Objective:   Physical Exam  Constitutional: She appears well-nourished.  Cardiovascular: Normal rate.   Pulmonary/Chest: Effort normal.  Musculoskeletal:  Bilateral lower back tenderness, more so to right. Muscle spasm noted to right lower side.  Skin: Skin is warm and dry.  Mild to moderate, well healing, abrasions to bilateral patella. No s/s of infection.          Assessment & Plan:  Muscle Spasm:  Pain and spasms to right lower back since fall 6 days ago. Well healing abrasions to bilateral patella. Muscle spasm to right lower back, moderate tenderness upon palpation, mild decrease in ROM during visit today. Little relief with home and OTC treatment. RX for cyclobenzaprine and meloxicam sent to pharmacy as needed for spasms and pain. Discussed no ibuprofen or aleve with meloxicam. Continue application of heat, remain active to prevent stiffness. Work note provided for tomorrow. Follow up PRN.

## 2015-12-08 NOTE — Patient Instructions (Signed)
Start cyclobenzaprine 5 mg for muscle spasms. Take 1 tablet by mouth every 8 hours as needed for muscle spasms.   You may take the Meloxicam once daily as needed for moderate to severe pain.  Continue application of heat. Do not sit too still to prevent stiffness.  Your discomfort may be present for another week, but should gradually improve.  It was a pleasure to see you today!  Back Pain, Adult Back pain is very common in adults.The cause of back pain is rarely dangerous and the pain often gets better over time.The cause of your back pain may not be known. Some common causes of back pain include:  Strain of the muscles or ligaments supporting the spine.  Wear and tear (degeneration) of the spinal disks.  Arthritis.  Direct injury to the back. For many people, back pain may return. Since back pain is rarely dangerous, most people can learn to manage this condition on their own. HOME CARE INSTRUCTIONS Watch your back pain for any changes. The following actions may help to lessen any discomfort you are feeling:  Remain active. It is stressful on your back to sit or stand in one place for long periods of time. Do not sit, drive, or stand in one place for more than 30 minutes at a time. Take short walks on even surfaces as soon as you are able.Try to increase the length of time you walk each day.  Exercise regularly as directed by your health care provider. Exercise helps your back heal faster. It also helps avoid future injury by keeping your muscles strong and flexible.  Do not stay in bed.Resting more than 1-2 days can delay your recovery.  Pay attention to your body when you bend and lift. The most comfortable positions are those that put less stress on your recovering back. Always use proper lifting techniques, including:  Bending your knees.  Keeping the load close to your body.  Avoiding twisting.  Find a comfortable position to sleep. Use a firm mattress and lie on your  side with your knees slightly bent. If you lie on your back, put a pillow under your knees.  Avoid feeling anxious or stressed.Stress increases muscle tension and can worsen back pain.It is important to recognize when you are anxious or stressed and learn ways to manage it, such as with exercise.  Take medicines only as directed by your health care provider. Over-the-counter medicines to reduce pain and inflammation are often the most helpful.Your health care provider may prescribe muscle relaxant drugs.These medicines help dull your pain so you can more quickly return to your normal activities and healthy exercise.  Apply ice to the injured area:  Put ice in a plastic bag.  Place a towel between your skin and the bag.  Leave the ice on for 20 minutes, 2-3 times a day for the first 2-3 days. After that, ice and heat may be alternated to reduce pain and spasms.  Maintain a healthy weight. Excess weight puts extra stress on your back and makes it difficult to maintain good posture. SEEK MEDICAL CARE IF:  You have pain that is not relieved with rest or medicine.  You have increasing pain going down into the legs or buttocks.  You have pain that does not improve in one week.  You have night pain.  You lose weight.  You have a fever or chills. SEEK IMMEDIATE MEDICAL CARE IF:   You develop new bowel or bladder control problems.  You have  unusual weakness or numbness in your arms or legs.  You develop nausea or vomiting.  You develop abdominal pain.  You feel faint.   This information is not intended to replace advice given to you by your health care provider. Make sure you discuss any questions you have with your health care provider.   Document Released: 07/02/2005 Document Revised: 07/23/2014 Document Reviewed: 11/03/2013 Elsevier Interactive Patient Education [employer].

## 2015-12-08 NOTE — Addendum Note (Signed)
Addended by: Jacqualin Combes on: 12/08/2015 02:36 PM   Modules accepted: Orders, SmartSet

## 2015-12-14 ENCOUNTER — Ambulatory Visit (HOSPITAL_BASED_OUTPATIENT_CLINIC_OR_DEPARTMENT_OTHER): Payer: BC Managed Care – PPO | Admitting: Hematology and Oncology

## 2015-12-14 ENCOUNTER — Encounter: Payer: Self-pay | Admitting: Hematology and Oncology

## 2015-12-14 ENCOUNTER — Ambulatory Visit (HOSPITAL_BASED_OUTPATIENT_CLINIC_OR_DEPARTMENT_OTHER): Payer: BC Managed Care – PPO

## 2015-12-14 ENCOUNTER — Telehealth: Payer: Self-pay | Admitting: Hematology and Oncology

## 2015-12-14 VITALS — BP 145/103 | HR 104 | Temp 97.6°F | Resp 19 | Wt 245.6 lb

## 2015-12-14 DIAGNOSIS — K909 Intestinal malabsorption, unspecified: Secondary | ICD-10-CM

## 2015-12-14 DIAGNOSIS — D509 Iron deficiency anemia, unspecified: Secondary | ICD-10-CM

## 2015-12-14 LAB — CBC & DIFF AND RETIC
BASO%: 0.1 % (ref 0.0–2.0)
Basophils Absolute: 0 10*3/uL (ref 0.0–0.1)
EOS%: 2.6 % (ref 0.0–7.0)
Eosinophils Absolute: 0.3 10*3/uL (ref 0.0–0.5)
HEMATOCRIT: 42 % (ref 34.8–46.6)
HGB: 13.8 g/dL (ref 11.6–15.9)
Immature Retic Fract: 5.4 % (ref 1.60–10.00)
LYMPH#: 1.5 10*3/uL (ref 0.9–3.3)
LYMPH%: 16 % (ref 14.0–49.7)
MCH: 28.6 pg (ref 25.1–34.0)
MCHC: 32.9 g/dL (ref 31.5–36.0)
MCV: 87 fL (ref 79.5–101.0)
MONO#: 0.9 10*3/uL (ref 0.1–0.9)
MONO%: 9.2 % (ref 0.0–14.0)
NEUT%: 72.1 % (ref 38.4–76.8)
NEUTROS ABS: 6.8 10*3/uL — AB (ref 1.5–6.5)
Platelets: 357 10*3/uL (ref 145–400)
RBC: 4.83 10*6/uL (ref 3.70–5.45)
RDW: 13.6 % (ref 11.2–14.5)
RETIC %: 1.32 % (ref 0.70–2.10)
RETIC CT ABS: 63.76 10*3/uL (ref 33.70–90.70)
WBC: 9.5 10*3/uL (ref 3.9–10.3)

## 2015-12-14 LAB — LACTATE DEHYDROGENASE: LDH: 174 U/L (ref 125–245)

## 2015-12-14 NOTE — Telephone Encounter (Signed)
appt made and avs printed °

## 2015-12-14 NOTE — Progress Notes (Signed)
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 She had last iron infusion October 2014. She has not been seen by the Hematology department and she requested to be seen by [place]. It appears that she is having increasing fatigue. She also having mood swings and hot flashes which are most likely due to menopausal changes. She's also noticed increased cravings for ice chips as well as moderate. She is also complaining of cramps in the legs.  REVIEW OF SYSTEMS:   Constitutional: Denies fevers, chills or abnormal weight loss Eyes: Denies blurriness of vision Ears, nose, mouth, throat, and face: Denies mucositis or sore throat Respiratory: Denies cough, dyspnea or wheezes Cardiovascular: Denies palpitation, chest discomfort Gastrointestinal:  Denies nausea, heartburn or change in bowel habits Skin: Denies abnormal skin rashes Lymphatics: Denies new lymphadenopathy or easy bruising Neurological:Denies numbness, tingling or new weaknesses Behavioral/Psych: Mood is stable, no new changes  Extremities: No lower extremity edema, leg cramps  All other systems were reviewed with the patient and are negative.  I have reviewed the past medical history, past surgical history, social history and family history with the patient and they are unchanged from previous note.  ALLERGIES:  has No Known Allergies.  MEDICATIONS:  Current Outpatient Prescriptions  Medication Sig Dispense Refill  . cholecalciferol (VITAMIN D) 1000 UNITS tablet Take 1,000 Units by mouth daily.    . cyclobenzaprine (FLEXERIL) 5 MG tablet Take 1 tablet (5 mg total) by mouth 3 (three) times daily as needed for muscle spasms. 30 tablet 0  .  gabapentin (NEURONTIN) 100 MG capsule TAKE 1 CAPSULE (100 MG TOTAL) BY MOUTH AT BEDTIME. 60 capsule 3  . hydrochlorothiazide (HYDRODIURIL) 12.5 MG tablet Take 1 tablet (12.5 mg total) by mouth daily. 90 tablet 0  . Iron Combinations TABS Take 1 tablet by mouth daily.    Marland Kitchen levonorgestrel (MIRENA) 20 MCG/24HR IUD 1 each by Intrauterine route once.    . meloxicam (MOBIC) 15 MG tablet Take 1 tablet (15 mg total) by mouth daily as needed for pain. 15 tablet 0  . Multiple Vitamin (MULTIVITAMIN) tablet Take 1 tablet by mouth daily.      . ranitidine (ZANTAC) 150 MG tablet Take 150 mg by mouth 2 (two) times daily. Reported on 12/08/2015    . sertraline (ZOLOFT) 50 MG tablet Take 1 tablet (50 mg total) by mouth daily. 30 tablet 4   No current facility-administered medications for this visit.    PHYSICAL EXAMINATION: ECOG PERFORMANCE STATUS: 1 - Symptomatic but completely ambulatory  Filed Vitals:   12/14/15 1437  BP: 145/103  Pulse: 104  Temp: 97.6 F (36.4 C)  Resp: 19   Filed Weights   12/14/15 1437  Weight: 245 lb 9.6 oz (111.403 kg)    GENERAL:alert, no distress and comfortable SKIN: skin color, texture, turgor are normal, no rashes or significant lesions EYES: normal, Conjunctiva are pink and non-injected, sclera clear OROPHARYNX:no exudate, no erythema and lips, buccal mucosa, and tongue normal  NECK: supple, thyroid normal size, non-tender, without nodularity LYMPH:  no palpable lymphadenopathy in the cervical, axillary or inguinal LUNGS: clear to auscultation and percussion with normal breathing effort HEART: regular rate & rhythm and no murmurs and no lower extremity edema ABDOMEN:abdomen soft, non-tender  and normal bowel sounds MUSCULOSKELETAL:no cyanosis of digits and no clubbing  NEURO: alert & oriented x 3 with fluent speech, no focal motor/sensory deficits EXTREMITIES: No lower extremity edema  LABORATORY DATA:  I have reviewed the data as listed   Chemistry        Component Value Date/Time   NA 140 05/27/2015 0816   NA 141 04/28/2013 1309   K 3.8 05/27/2015 0816   K 3.9 04/28/2013 1309   CL 105 05/27/2015 0816   CO2 28 05/27/2015 0816   CO2 25 04/28/2013 1309   BUN 10 05/27/2015 0816   BUN 10.9 04/28/2013 1309   CREATININE 0.84 05/27/2015 0816   CREATININE 0.8 04/28/2013 1309      Component Value Date/Time   CALCIUM 9.1 05/27/2015 0816   CALCIUM 8.8 04/28/2013 1309   ALKPHOS 106 04/28/2013 1309   ALKPHOS 87 06/06/2012 0822   AST 20 04/28/2013 1309   AST 22 06/06/2012 0822   ALT 17 04/28/2013 1309   ALT 13 06/06/2012 0822   BILITOT 0.54 04/28/2013 1309   BILITOT 0.3 06/06/2012 0822      Lab Results  Component Value Date   WBC 9.1 05/27/2015   HGB 14.6 05/27/2015   HCT 44.3 05/27/2015   MCV 86.4 05/27/2015   PLT 331.0 05/27/2015   NEUTROABS 7.0 05/27/2015   ASSESSMENT & PLAN:  1. 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 If she is found deficient we will give her IV iron treatment. If she is B-12 deficient I recommend monthly B-12 injections.  Return to clinic in 6 months for follow-up.  No orders of the defined types were placed in this encounter.   The patient has a good understanding of the overall plan. she agrees with it. she will call with any problems that may develop before the next visit here.   Rulon Eisenmenger, MD 12/14/2015

## 2015-12-15 LAB — IRON AND TIBC
%SAT: 25 % (ref 21–57)
Iron: 99 ug/dL (ref 41–142)
TIBC: 394 ug/dL (ref 236–444)
UIBC: 295 ug/dL (ref 120–384)

## 2015-12-15 LAB — VITAMIN B12: VITAMIN B 12: 323 pg/mL (ref 211–946)

## 2015-12-15 LAB — FOLATE: FOLATE: 16.7 ng/mL (ref >3.0)

## 2015-12-15 LAB — FERRITIN: FERRITIN: 19 ng/mL (ref 9–269)

## 2015-12-29 ENCOUNTER — Ambulatory Visit: Payer: BC Managed Care – PPO | Admitting: Family Medicine

## 2015-12-29 ENCOUNTER — Ambulatory Visit: Payer: BC Managed Care – PPO | Admitting: Internal Medicine

## 2015-12-29 DIAGNOSIS — Z0289 Encounter for other administrative examinations: Secondary | ICD-10-CM

## 2016-02-07 ENCOUNTER — Ambulatory Visit (INDEPENDENT_AMBULATORY_CARE_PROVIDER_SITE_OTHER): Payer: BC Managed Care – PPO | Admitting: Family Medicine

## 2016-02-07 VITALS — BP 128/88 | HR 78 | Temp 98.4°F | Ht 65.0 in | Wt 245.0 lb

## 2016-02-07 DIAGNOSIS — T63301A Toxic effect of unspecified spider venom, accidental (unintentional), initial encounter: Secondary | ICD-10-CM

## 2016-02-07 MED ORDER — DOXYCYCLINE HYCLATE 100 MG PO CAPS: 100.0000 mg | ORAL_CAPSULE | Freq: Two times a day (BID) | ORAL | 0 refills | Status: AC

## 2016-02-07 MED ORDER — FLUCONAZOLE 150 MG PO TABS
150.0000 mg | ORAL_TABLET | Freq: Every day | ORAL | 5 refills | Status: DC
Start: 2016-02-07 — End: 2016-02-28

## 2016-02-07 NOTE — Progress Notes (Signed)
   Subjective:    Patient ID: Theresa Pratt, female    DOB: [birth year], 45 y.o.   MRN: [id number]  HPI Here for 2 days of a painful pimple on the right forearm. No fever. Using ice packs.    Review of Systems  Constitutional: Negative.   Respiratory: Negative.   Cardiovascular: Negative.   Skin: Positive for wound.       Objective:   Physical Exam  Constitutional: She is oriented to person, place, and time. She appears well-developed and well-nourished. No distress.  Cardiovascular: Normal rate, regular rhythm, normal heart sounds and intact distal pulses.   Pulmonary/Chest: Effort normal and breath sounds normal.  Neurological: She is alert and oriented to person, place, and time.  Skin:  The right forearm has a small papular red lesion which is tender. It is surrounded by a thin zone of erythema and warmth.           Assessment & Plan:  Possible spider bite. We will treat with Doxycycline.  Laurey Morale, MD

## 2016-02-07 NOTE — Progress Notes (Signed)
Pre visit review using our clinic review tool, if applicable. No additional management support is needed unless otherwise documented below in the visit note. 

## 2016-02-28 ENCOUNTER — Ambulatory Visit (INDEPENDENT_AMBULATORY_CARE_PROVIDER_SITE_OTHER): Payer: BC Managed Care – PPO | Admitting: Family Medicine

## 2016-02-28 ENCOUNTER — Encounter: Payer: Self-pay | Admitting: Family Medicine

## 2016-02-28 DIAGNOSIS — F4322 Adjustment disorder with anxiety: Secondary | ICD-10-CM | POA: Diagnosis not present

## 2016-02-28 DIAGNOSIS — F4310 Post-traumatic stress disorder, unspecified: Secondary | ICD-10-CM | POA: Insufficient documentation

## 2016-02-28 NOTE — Assessment & Plan Note (Addendum)
Significant stressors compounding over last several months resulting in feeling overwhelmed. Support provided. Discussed stress relieving techniques. Suggested establish with counseling available through employee assistance program at work. Agree with short break from work for mental health to prevent worsening. Continue sertraline 50mg  daily. Pt agrees with plan. Letter provided - OFF Thursday, Friday and HALF DAY all next week then return to full time schedule.

## 2016-02-28 NOTE — Progress Notes (Signed)
Pre visit review using our clinic review tool, if applicable. No additional management support is needed unless otherwise documented below in the visit note. 

## 2016-02-28 NOTE — Patient Instructions (Signed)
We will write letter out of work for this week.  Half days next week then return to full schedule.  Schedule counseling with employee assistance program.  Let me know how you're doing.

## 2016-02-28 NOTE — Progress Notes (Signed)
BP 120/88   Pulse (!) 58   Ht 5\' 5"  (1.651 m)   Wt 247 lb (112 kg)   SpO2 96%   BMI 41.10 kg/m    CC: discuss anxiety Subjective:    Patient ID: [name], female    DOB: [birth year], 45 y.o.   MRN: [id number]  HPI: AUDRAE PRANKE is a 45 y.o. female presenting on 02/28/2016 for Anxiety   Predominant anxiety over last few months. Started on sertraline 50mg  daily per OBGYN. If she takes regularly, notes some trouble with "lows". Feeling overwhelmed. Difficulty going back to work. Asks about time off work.   Denies current SI/HI. Remote h/o SI.   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  Nephew with bipolar depression and schizophrenia s/p suicide last month - this was also financial stressor.   Continues walking regularly and watching diet, still no weight loss.  Lab Results  Component Value Date   TSH 1.03 05/27/2015     Relevant past medical, surgical, family and social history reviewed and updated as indicated. Interim medical history since our last visit reviewed. Allergies and medications reviewed and updated. Current Outpatient Prescriptions on File Prior to Visit  Medication Sig  . cholecalciferol (VITAMIN D) 1000 UNITS tablet Take 1,000 Units by mouth daily.  . cyclobenzaprine (FLEXERIL) 5 MG tablet Take 1 tablet (5 mg total) by mouth 3 (three) times daily as needed for muscle spasms.  Marland Kitchen gabapentin (NEURONTIN) 100 MG capsule TAKE 1 CAPSULE (100 MG TOTAL) BY MOUTH AT BEDTIME.  . hydrochlorothiazide (HYDRODIURIL) 12.5 MG tablet Take 1 tablet (12.5 mg total) by mouth daily.  . Iron Combinations TABS Take 1 tablet by mouth daily.  Marland Kitchen levonorgestrel (MIRENA) 20 MCG/24HR IUD 1 each by Intrauterine route once.  . meloxicam (MOBIC) 15 MG tablet Take 1 tablet (15 mg total) by mouth daily as needed for pain.  . Multiple Vitamin (MULTIVITAMIN) tablet Take 1  tablet by mouth daily.    . ranitidine (ZANTAC) 150 MG tablet Take 150 mg by mouth 2 (two) times daily. Reported on 12/08/2015  . sertraline (ZOLOFT) 50 MG tablet Take 1 tablet (50 mg total) by mouth daily.   No current facility-administered medications on file prior to visit.     Review of Systems Per HPI unless specifically indicated in ROS section     Objective:    BP 120/88   Pulse (!) 58   Ht 5\' 5"  (1.651 m)   Wt 247 lb (112 kg)   SpO2 96%   BMI 41.10 kg/m   Wt Readings from Last 3 Encounters:  02/28/16 247 lb (112 kg)  02/07/16 245 lb (111.1 kg)  12/14/15 245 lb 9.6 oz (111.4 kg)    Physical Exam  Constitutional: She appears well-developed and well-nourished. No distress.  Psychiatric: She has a normal mood and affect. Her speech is normal and behavior is normal. Judgment and thought content normal. Cognition and memory are normal.  Nursing note and vitals reviewed.     Assessment & Plan:  Over 25 minutes were spent face-to-face with the patient during this encounter and >50% of that time was spent on counseling and coordination of care  Problem List Items Addressed This Visit    Adjustment disorder with anxiety    Significant stressors compounding over last several months resulting in feeling overwhelmed. Support provided. Discussed stress relieving techniques. Suggested establish  with counseling available through employee assistance program at work. Agree with short break from work for mental health to prevent worsening. Continue sertraline 50mg  daily. Pt agrees with plan. Letter provided - OFF Thursday, Friday and HALF DAY all next week then return to full time schedule.        Other Visit Diagnoses   None.      Follow up plan: Return if symptoms worsen or fail to improve.  [name], MD

## 2016-03-05 ENCOUNTER — Ambulatory Visit (INDEPENDENT_AMBULATORY_CARE_PROVIDER_SITE_OTHER): Payer: BC Managed Care – PPO | Admitting: Internal Medicine

## 2016-03-05 ENCOUNTER — Encounter: Payer: Self-pay | Admitting: Internal Medicine

## 2016-03-05 DIAGNOSIS — L508 Other urticaria: Secondary | ICD-10-CM | POA: Insufficient documentation

## 2016-03-05 DIAGNOSIS — L501 Idiopathic urticaria: Secondary | ICD-10-CM | POA: Insufficient documentation

## 2016-03-05 MED ORDER — HYDROXYZINE HCL 25 MG PO TABS: 25.0000 mg | ORAL_TABLET | Freq: Three times a day (TID) | ORAL | 0 refills | Status: DC | PRN

## 2016-03-05 NOTE — Progress Notes (Signed)
Pre visit review using our clinic review tool, if applicable. No additional management support is needed unless otherwise documented below in the visit note. 

## 2016-03-05 NOTE — Progress Notes (Signed)
Subjective:    Patient ID: Theresa Pratt, female    DOB: Oct 02, 1970, 45 y.o.   MRN: [id number]  HPI Here due to rash  On half days for work due to anxiety Rough summer at work, nephew committed suicide Now going back to work but there are few problems  She now notes itching by the end of the day Breaking out in upper legs--like whelps They seem to come on when she is stressed When everyone is asking questions at work--she notices the itching worse  Used gabapentin for past shingles Stopped this since symptoms better Doesn't have any pain there  Has tried benedryl--this helps but makes her tired Has seen allergist for hives in past---didn't test positive for any allergens  Current Outpatient Prescriptions on File Prior to Visit  Medication Sig Dispense Refill  . cholecalciferol (VITAMIN D) 1000 UNITS tablet Take 1,000 Units by mouth daily.    . cyclobenzaprine (FLEXERIL) 5 MG tablet Take 1 tablet (5 mg total) by mouth 3 (three) times daily as needed for muscle spasms. 30 tablet 0  . gabapentin (NEURONTIN) 100 MG capsule TAKE 1 CAPSULE (100 MG TOTAL) BY MOUTH AT BEDTIME. 60 capsule 3  . hydrochlorothiazide (HYDRODIURIL) 12.5 MG tablet Take 1 tablet (12.5 mg total) by mouth daily. 90 tablet 0  . Iron Combinations TABS Take 1 tablet by mouth daily.    Marland Kitchen levonorgestrel (MIRENA) 20 MCG/24HR IUD 1 each by Intrauterine route once.    . meloxicam (MOBIC) 15 MG tablet Take 1 tablet (15 mg total) by mouth daily as needed for pain. 15 tablet 0  . Multiple Vitamin (MULTIVITAMIN) tablet Take 1 tablet by mouth daily.      . ranitidine (ZANTAC) 150 MG tablet Take 150 mg by mouth 2 (two) times daily. Reported on 12/08/2015    . sertraline (ZOLOFT) 50 MG tablet Take 1 tablet (50 mg total) by mouth daily. 30 tablet 4   No current facility-administered medications on file prior to visit.     No Known Allergies  Past Medical History:  Diagnosis Date  . Allergic rhinitis    post gastric  bypass  . Arthropathy    weight bearing (pre gastric bypass)  . DM (diabetes mellitus) ([place])    pre gastric bypass  . GERD (gastroesophageal reflux disease)    post gastric bypass  . HTN (hypertension)    Pre gastric bypass  . HTN (hypertension)    post gastric bypass  . IDA (iron deficiency anemia)    post gastric bypass (iron infusion by hematology-doesn't build stores -Dr. Humphrey Rolls)  . Morbid obesity ([place])    Pre gastric bypass  . 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 Oligomenorrhea    on Implanon (pre gastric bypass)  . Postpartum depression    post gastric bypass  . Pre-eclampsia    post gastric bypass    Past Surgical History:  Procedure Laterality Date  . CHOLECYSTECTOMY  08/2010  . 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 implanon placed  03/16/09   (3 years)  . rectal condyloma removal  2006    Family History  Problem Relation Age of Onset  . Coronary artery disease Father     2 stents; + smoker  . Hypertension Father   . Thyroid disease Brother   . Diabetes Maternal Grandmother   . Cirrhosis Paternal Grandmother   . Alcohol abuse Paternal Grandmother   . Breast cancer      Paternal great aunt(obesity, smoker, EtOHic)  .  Breast cancer      Maternal great aunt (deceased in 56's from same)  . Thyroid disease Other     cousins and grandmother  . Cancer Other     great aunt - lung  . Liver disease Paternal Aunt   . Cancer Cousin     liver, bile duct  . Cancer Mother     uterine    Social History   Social History  . Marital status: Married    Spouse name: Theresa Pratt  . Number of children: 2  . Years of education: Theresa Pratt   Occupational History  . [employer] Dept Head    Social History Main Topics  . Smoking status: Never Smoker  . Smokeless tobacco: Never Used  . Alcohol use Yes     Comment: occasional  . Drug use: No  . Sexual activity: Yes    Partners: Male    Birth control/ protection: None   Other Topics Concern  . 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  Wants to be substance abuse counselor   Activity: no regular exercise   Diet: good water, fruits/vegetables daily, does like juice   Review of Systems Some puffiness in left 3rd and 4th PIP No fever Uses ranitidine prn--tried it for the itching    Objective:   Physical Exam  Musculoskeletal:  No active synovitis  Skin:  Scattered small raised red spots on legs/arms  Psychiatric:  Good insight and judgement No depression          Assessment & Plan:

## 2016-03-05 NOTE — Assessment & Plan Note (Signed)
Seems to be stress related Discussed coping strategies at work Restart fexofenadine 180 at bedtime Hydroxyzine 25 tid prn

## 2016-03-05 NOTE — Patient Instructions (Signed)
Please start allegra (fexofenadine 180mg ) daily at bedtime.  Use the hydroxyzine during the day as needed if the itching comes up.

## 2016-03-12 ENCOUNTER — Ambulatory Visit (INDEPENDENT_AMBULATORY_CARE_PROVIDER_SITE_OTHER): Payer: BC Managed Care – PPO | Admitting: Family Medicine

## 2016-03-12 ENCOUNTER — Encounter: Payer: Self-pay | Admitting: *Deleted

## 2016-03-12 ENCOUNTER — Encounter: Payer: Self-pay | Admitting: Family Medicine

## 2016-03-12 VITALS — BP 134/90 | HR 64 | Temp 98.3°F | Wt 243.0 lb

## 2016-03-12 DIAGNOSIS — L508 Other urticaria: Secondary | ICD-10-CM

## 2016-03-12 DIAGNOSIS — Z566 Other physical and mental strain related to work: Secondary | ICD-10-CM | POA: Diagnosis not present

## 2016-03-12 DIAGNOSIS — F4322 Adjustment disorder with anxiety: Secondary | ICD-10-CM | POA: Diagnosis not present

## 2016-03-12 NOTE — Assessment & Plan Note (Addendum)
Stress induced urticaria.  Continue allegra. Increase zantac to BID dosing.  Decrease hydroxyzine to 1/2 tablet PRN (12.5mg ) as full dose overly sedating.

## 2016-03-12 NOTE — Patient Instructions (Addendum)
Continue allegra 180mg  once daily. Increase zantac to twice daily. Continue hydroxyzine but try 1/2 tablet at a time as needed for itching.  We will write you out of work for the rest of the week starting on Wednesday.  I will work on [employer] forms.

## 2016-03-12 NOTE — Progress Notes (Signed)
BP 134/90   Pulse 64   Temp 98.3 F (36.8 C) (Oral)   Wt 243 lb (110.2 kg)   BMI 40.44 kg/m    CC: anxiety, hives Subjective:    Patient ID: Theresa Pratt, female    DOB: 1970/11/08, 45 y.o.   MRN: [id number]  HPI: Theresa Pratt is a 45 y.o. female presenting on 03/12/2016 for Anxiety and Urticaria   See prior notes for details.  Seen last week by Dr Silvio Pate with urticarial rash thought stress related - started on allegra 180mg  daily and hydroxyzine PRN itching during the day.   She has established with counselor through EAP at work. She has also seen pastor for counseling. Last visit we took her off work a few days then 1 wk half days. Plan was return to work full schedule today.  Ongoing stressors at work. Time spent discussing pt concerns.   Relevant past medical, surgical, family and social history reviewed and updated as indicated. Interim medical history since our last visit reviewed. Allergies and medications reviewed and updated. Current Outpatient Prescriptions on File Prior to Visit  Medication Sig  . cholecalciferol (VITAMIN D) 1000 UNITS tablet Take 1,000 Units by mouth daily.  . cyclobenzaprine (FLEXERIL) 5 MG tablet Take 1 tablet (5 mg total) by mouth 3 (three) times daily as needed for muscle spasms.  . hydrochlorothiazide (HYDRODIURIL) 12.5 MG tablet Take 1 tablet (12.5 mg total) by mouth daily.  . hydrOXYzine (ATARAX/VISTARIL) 25 MG tablet Take 1 tablet (25 mg total) by mouth 3 (three) times daily as needed.  . Iron Combinations TABS Take 1 tablet by mouth daily.  Marland Kitchen levonorgestrel (MIRENA) 20 MCG/24HR IUD 1 each by Intrauterine route once.  . meloxicam (MOBIC) 15 MG tablet Take 1 tablet (15 mg total) by mouth daily as needed for pain.  . Multiple Vitamin (MULTIVITAMIN) tablet Take 1 tablet by mouth daily.    . ranitidine (ZANTAC) 150 MG tablet Take 150 mg by mouth 2 (two) times daily. Reported on 12/08/2015  . sertraline (ZOLOFT) 50 MG tablet Take 1  tablet (50 mg total) by mouth daily.   No current facility-administered medications on file prior to visit.     Review of Systems Per HPI unless specifically indicated in ROS section     Objective:    BP 134/90   Pulse 64   Temp 98.3 F (36.8 C) (Oral)   Wt 243 lb (110.2 kg)   BMI 40.44 kg/m   Wt Readings from Last 3 Encounters:  03/12/16 243 lb (110.2 kg)  03/05/16 244 lb (110.7 kg)  02/28/16 247 lb (112 kg)    Physical Exam  Constitutional: She appears well-developed and well-nourished. No distress.  Skin: Skin is warm and dry. Rash noted.  Few scattered red spots on legs  Nursing note and vitals reviewed.      Assessment & Plan:  Over 25 minutes were spent face-to-face with the patient during this encounter and >50% of that time was spent on counseling and coordination of care  Problem List Items Addressed This Visit    Adjustment disorder with anxiety   Chronic urticaria    Stress induced urticaria.  Continue allegra. Increase zantac to BID dosing.  Decrease hydroxyzine to 1/2 tablet PRN (12.5mg ) as full dose overly sedating.       Work-related stress - Primary    Stressors discussed. Continued overwhelming and feels lack of support.  Encouraged continued counseling through work's EAP and through pastor at church.  She has appt with boss tomorrow to discuss concerns. If concerns not getting resolved encouraged consider searching for a new job which she is considering.        Other Visit Diagnoses   None.      Follow up plan: No Follow-up on file.  [name], MD

## 2016-03-12 NOTE — Progress Notes (Signed)
Pre visit review using our clinic review tool, if applicable. No additional management support is needed unless otherwise documented below in the visit note. 

## 2016-03-12 NOTE — Assessment & Plan Note (Addendum)
Stressors discussed. Continued overwhelming and feels lack of support.  Encouraged continued counseling through work's EAP and through pastor at church.  She has appt with boss tomorrow to discuss concerns. If concerns not getting resolved encouraged consider searching for a new job which she is considering.

## 2016-04-09 ENCOUNTER — Other Ambulatory Visit: Payer: Self-pay | Admitting: Internal Medicine

## 2016-04-11 ENCOUNTER — Encounter: Payer: Self-pay | Admitting: Family Medicine

## 2016-04-11 ENCOUNTER — Ambulatory Visit (INDEPENDENT_AMBULATORY_CARE_PROVIDER_SITE_OTHER): Payer: BC Managed Care – PPO | Admitting: Family Medicine

## 2016-04-11 VITALS — BP 120/94 | HR 100 | Temp 98.7°F | Wt 246.8 lb

## 2016-04-11 DIAGNOSIS — L508 Other urticaria: Secondary | ICD-10-CM | POA: Diagnosis not present

## 2016-04-11 DIAGNOSIS — M25562 Pain in left knee: Secondary | ICD-10-CM

## 2016-04-11 DIAGNOSIS — M25561 Pain in right knee: Secondary | ICD-10-CM | POA: Diagnosis not present

## 2016-04-11 LAB — SEDIMENTATION RATE: SED RATE: 17 mm/h (ref 0–20)

## 2016-04-11 MED ORDER — PREDNISONE 20 MG PO TABS: 20.0000 mg | ORAL_TABLET | Freq: Every day | ORAL | 0 refills | Status: AC

## 2016-04-11 NOTE — Progress Notes (Signed)
Subjective:    Patient ID: Theresa Pratt, female    DOB: 04-14-1971, 45 y.o.   MRN: [id number]  HPI This is a 45 yo female who presents today with urticaria. This has been an ongoing problem, has had work up by allergist that did not reveal any allergies. She has been treated with allegra, ranitidine, hydroxizine 1/2 tablet. Noticed increasing rash today and has severe itching. Has been told it is related to stress. Her job is very stressful. Continues to see EAP through her job which is stressful. Has a lead on a 10 month job in [place] that she would like. She reports feeling much better when she is away from her job site. Has some concern that there is a "toxic mold" in her work environment. Has consulted with custodial services to determine if there is any mold.   Bilateral knee pain over last several days, achy.   Past Medical History:  Diagnosis Date  . Allergic rhinitis    post gastric bypass  . Arthropathy    weight bearing (pre gastric bypass)  . DM (diabetes mellitus) ([place])    pre gastric bypass  . GERD (gastroesophageal reflux disease)    post gastric bypass  . HTN (hypertension)    Pre gastric bypass  . HTN (hypertension)    post gastric bypass  . IDA (iron deficiency anemia)    post gastric bypass (iron infusion by hematology-doesn't build stores -Dr. Humphrey Rolls)  . Morbid obesity ([place])    Pre gastric bypass  . 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 Oligomenorrhea    on Implanon (pre gastric bypass)  . Postpartum depression    post gastric bypass  . Pre-eclampsia    post gastric bypass   Past Surgical History:  Procedure Laterality Date  . CHOLECYSTECTOMY  08/2010  . 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 implanon placed  03/16/09   (3 years)  . rectal condyloma removal  2006   Family History  Problem Relation Age of Onset  . Coronary artery disease Father     2 stents; + smoker  . Hypertension Father   . Thyroid disease Brother   . Diabetes Maternal  Grandmother   . Cirrhosis Paternal Grandmother   . Alcohol abuse Paternal Grandmother   . Breast cancer      Paternal great aunt(obesity, smoker, EtOHic)  . Breast cancer      Maternal great aunt (deceased in 22's from same)  . Thyroid disease Other     cousins and grandmother  . Cancer Other     great aunt - lung  . Liver disease Paternal Aunt   . Cancer Cousin     liver, bile duct  . Cancer Mother     uterine   Social History  Substance Use Topics  . Smoking status: Never Smoker  . Smokeless tobacco: Never Used  . Alcohol use Yes     Comment: occasional      Review of Systems Per HPI    Objective:   Physical Exam  Constitutional: She is oriented to person, place, and time. She appears well-developed and well-nourished. No distress.  Obese.   HENT:  Head: Normocephalic and atraumatic.  Eyes: Conjunctivae are normal.  Cardiovascular: Normal rate.   Pulmonary/Chest: Effort normal.  Musculoskeletal: Normal range of motion.  Neurological: She is alert and oriented to person, place, and time.  Skin: Skin is warm and dry. Rash (scattered wheels over arms, legs, evidence of scratching with healing,  scabbed scratches. ) noted. She is not diaphoretic.  Psychiatric: She has a normal mood and affect. Her behavior is normal. Judgment and thought content normal.  Vitals reviewed.     BP (!) 120/94   Pulse 100   Temp 98.7 F (37.1 C)   Wt 246 lb 12.8 oz (111.9 kg)   SpO2 97%   BMI 41.07 kg/m  Wt Readings from Last 3 Encounters:  04/11/16 246 lb 12.8 oz (111.9 kg)  03/12/16 243 lb (110.2 kg)  03/05/16 244 lb (110.7 kg)       Assessment & Plan:  1. Chronic urticaria - Sedimentation rate - ANA - Ambulatory referral to Dermatology - predniSONE (DELTASONE) 20 MG tablet; Take 1 tablet (20 mg total) by mouth daily with breakfast.  Dispense: 6 tablet; Refill: 0- reviewed potential side effects - she is aware that symptoms seem exacerbated by her work environment and is  looking for different employment, in the meantime, I encouraged self care measures- resume exercise, make healthy food choices, adequate sleep and pleasurable distractions.   2. Knee pain, bilateral - Sedimentation rate - ANA   Clarene Reamer, FNP-BC  [facility] [place]  04/11/2016 2:48 PM

## 2016-04-12 LAB — ANA: Anti Nuclear Antibody(ANA): NEGATIVE

## 2016-04-18 ENCOUNTER — Emergency Department (HOSPITAL_COMMUNITY)
Admission: EM | Admit: 2016-04-18 | Discharge: 2016-04-18 | Disposition: A | Discharge status: Home / Self Care | Payer: BC Managed Care – PPO | Attending: Emergency Medicine | Admitting: Emergency Medicine

## 2016-04-18 ENCOUNTER — Encounter: Payer: Self-pay | Admitting: Family Medicine

## 2016-04-18 ENCOUNTER — Encounter (HOSPITAL_COMMUNITY): Payer: Self-pay

## 2016-04-18 ENCOUNTER — Ambulatory Visit (INDEPENDENT_AMBULATORY_CARE_PROVIDER_SITE_OTHER): Payer: BC Managed Care – PPO | Admitting: Family Medicine

## 2016-04-18 VITALS — BP 108/88 | HR 108 | Wt 246.8 lb

## 2016-04-18 DIAGNOSIS — R220 Localized swelling, mass and lump, head: Secondary | ICD-10-CM

## 2016-04-18 DIAGNOSIS — E119 Type 2 diabetes mellitus without complications: Secondary | ICD-10-CM | POA: Insufficient documentation

## 2016-04-18 DIAGNOSIS — Z79899 Other long term (current) drug therapy: Secondary | ICD-10-CM | POA: Insufficient documentation

## 2016-04-18 DIAGNOSIS — I10 Essential (primary) hypertension: Secondary | ICD-10-CM | POA: Insufficient documentation

## 2016-04-18 DIAGNOSIS — L509 Urticaria, unspecified: Secondary | ICD-10-CM | POA: Diagnosis not present

## 2016-04-18 DIAGNOSIS — L508 Other urticaria: Secondary | ICD-10-CM | POA: Diagnosis not present

## 2016-04-18 MED ORDER — PREDNISONE 20 MG PO TABS
60.0000 mg | ORAL_TABLET | Freq: Once | ORAL | Status: AC
  Administered 2016-04-18: 60 mg via ORAL
  Filled 2016-04-18: qty 3

## 2016-04-18 MED ORDER — PREDNISONE 10 MG (48) PO TBPK: ORAL_TABLET | Freq: Every day | ORAL | 0 refills | Status: DC

## 2016-04-18 MED ORDER — PREDNISONE 20 MG PO TABS: ORAL_TABLET | ORAL | 0 refills | Status: DC

## 2016-04-18 MED ORDER — DIPHENHYDRAMINE HCL 25 MG PO CAPS
50.0000 mg | ORAL_CAPSULE | Freq: Once | ORAL | Status: AC
  Administered 2016-04-18: 50 mg via ORAL
  Filled 2016-04-18: qty 2

## 2016-04-18 NOTE — ED Provider Notes (Signed)
[facility] DEPT Provider Note   CSN: [id number] Arrival date & time: 04/18/16  1419     History   Chief Complaint No chief complaint on file.   HPI Theresa Pratt is a 45 y.o. female.  The history is provided by the patient and the spouse.  Illness  This is a recurrent problem. The current episode started 12 to 24 hours ago (recurrent urticaria x 3 years). The problem occurs every several days. The problem has been gradually improving. Pertinent negatives include no abdominal pain, no headaches and no shortness of breath. Nothing aggravates the symptoms. The symptoms are relieved by medications (benadryl taken last night, improved 1 hour later). The treatment provided moderate relief.    Past Medical History:  Diagnosis Date  . Allergic rhinitis    post gastric bypass  . Arthropathy    weight bearing (pre gastric bypass)  . DM (diabetes mellitus) ([place])    pre gastric bypass  . GERD (gastroesophageal reflux disease)    post gastric bypass  . HTN (hypertension)    Pre gastric bypass  . HTN (hypertension)    post gastric bypass  . IDA (iron deficiency anemia)    post gastric bypass (iron infusion by hematology-doesn't build stores -Dr. Humphrey Rolls)  . Morbid obesity ([place])    Pre gastric bypass  . 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 Oligomenorrhea    on Implanon (pre gastric bypass)  . Postpartum depression    post gastric bypass  . Pre-eclampsia    post gastric bypass    Patient Active Problem List   Diagnosis Date Noted  . Chronic urticaria 03/05/2016  . Adjustment disorder with anxiety 02/28/2016  . Anxiety 06/29/2015  . 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 Chronic scapular pain 04/28/2014  . Work-related stress 04/28/2014  . Biceps tendonitis on right 04/28/2014  . Menorrhagia 07/04/2012  . 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 Healthcare maintenance 06/17/2012  . BREAST MASS, LEFT 07/21/2010  . BACK PAIN, LUMBAR 07/14/2010  . Vitamin D  deficiency 07/03/2010  . Iron deficiency anemia 07/03/2010  . ALLERGIC RHINITIS 07/03/2010  . GERD 07/03/2010  . Bariatric surgery status 07/03/2010  . Essential hypertension 06/22/2010    Past Surgical History:  Procedure Laterality Date  . CHOLECYSTECTOMY  08/2010  . 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 implanon placed  03/16/09   (3 years)  . rectal condyloma removal  2006    OB History    Gravida Para Term Preterm AB Living   2 2 2     2    SAB TAB Ectopic Multiple Live Births                   Home Medications    Prior to Admission medications   Medication Sig Start Date End Date Taking? Authorizing Provider  cholecalciferol (VITAMIN D) 1000 UNITS tablet Take 1,000 Units by mouth daily.    Historical Provider, MD  cyclobenzaprine (FLEXERIL) 5 MG tablet Take 1 tablet (5 mg total) by mouth 3 (three) times daily as needed for muscle spasms. 12/08/15   Pleas Koch, NP  hydrochlorothiazide (HYDRODIURIL) 12.5 MG tablet Take 1 tablet (12.5 mg total) by mouth daily. 11/05/14   Pleas Koch, NP  hydrOXYzine (ATARAX/VISTARIL) 25 MG tablet TAKE 1 TABLET (25 MG TOTAL) BY MOUTH 3 (THREE) TIMES DAILY AS NEEDED. 04/10/16   Venia Carbon, MD  Iron Combinations TABS Take 1 tablet by mouth daily.    Historical Provider, MD  levonorgestrel (MIRENA) 20 MCG/24HR IUD 1 each by Intrauterine route once.    Historical Provider, MD  meloxicam (MOBIC) 15 MG tablet Take 1 tablet (15 mg total) by mouth daily as needed for pain. 12/08/15   Pleas Koch, NP  Multiple Vitamin (MULTIVITAMIN) tablet Take 1 tablet by mouth daily.      Historical Provider, MD  predniSONE (STERAPRED UNI-PAK 48 TAB) 10 MG (48) TBPK tablet Take by mouth daily. Take 60 mg (6 tablets) QD x first 4 days Take 40 mg (4 tablets) QD x next 4 days Take 20 mg (2 tablets) QD x next 4 days Take 10 mg (1 tablet) QD x last 4 days 04/18/16 05/04/16  Paralee Cancel, MD  ranitidine (ZANTAC) 150 MG tablet Take 150 mg by  mouth 2 (two) times daily. Reported on 12/08/2015    Historical Provider, MD  sertraline (ZOLOFT) 50 MG tablet Take 1 tablet (50 mg total) by mouth daily. 07/21/15   Larey Days, CNM    Family History Family History  Problem Relation Age of Onset  . Coronary artery disease Father     2 stents; + smoker  . Hypertension Father   . Thyroid disease Brother   . Diabetes Maternal Grandmother   . Cirrhosis Paternal Grandmother   . Alcohol abuse Paternal Grandmother   . Breast cancer      Paternal great aunt(obesity, smoker, EtOHic)  . Breast cancer      Maternal great aunt (deceased in 73's from same)  . Thyroid disease Other     cousins and grandmother  . Cancer Other     great aunt - lung  . Liver disease Paternal Aunt   . Cancer Cousin     liver, bile duct  . Cancer Mother     uterine    Social History Social History  Substance Use Topics  . Smoking status: Never Smoker  . Smokeless tobacco: Never Used  . Alcohol use Yes     Comment: occasional     Allergies   Review of patient's allergies indicates no known allergies.   Review of Systems Review of Systems  Constitutional: Negative for chills, diaphoresis, fatigue and fever.  HENT: Negative for congestion.   Respiratory: Negative for shortness of breath.   Cardiovascular: Negative for leg swelling.  Gastrointestinal: Negative for abdominal pain.  Genitourinary: Negative for vaginal discharge.       Occasional suprapubic pain, some spotting especially after sexual intercourse, possibly 2/2 Mirena. No vaginal discharge or discomfort  Musculoskeletal: Negative for myalgias.  Skin: Positive for rash.       Diffuse urticaria  Neurological: Negative for headaches.  Psychiatric/Behavioral: Negative for confusion.     Physical Exam Updated Vital Signs BP 113/81 (BP Location: Right Arm)   Pulse 87   Temp 98.2 F (36.8 C) (Oral)   Resp 16   Ht 5\' 5"  (1.651 m)   Wt 111.6 kg   SpO2 99%   BMI 40.94 kg/m    Physical Exam  Constitutional: She is oriented to person, place, and time. She appears well-developed and well-nourished. No distress.  Pleasant, cooperative, overweight otherwise well-appearing  HENT:  Head: Normocephalic and atraumatic.  Eyes: Conjunctivae are normal. No scleral icterus.  Neck: Normal range of motion. Neck supple.  Cardiovascular: Normal rate and regular rhythm.   Pulmonary/Chest: Effort normal and breath sounds normal. No respiratory distress. She has no wheezes.  Abdominal: Soft. She exhibits no distension. There is no tenderness.  Musculoskeletal: She exhibits no  edema.  Neurological: She is alert and oriented to person, place, and time. She exhibits normal muscle tone. Coordination normal.  Skin: Skin is warm. Capillary refill takes less than 2 seconds. Rash noted. She is not diaphoretic.  Diffuse urticaria  Psychiatric: She has a normal mood and affect.  Nursing note and vitals reviewed.    ED Treatments / Results  Labs (all labs ordered are listed, but only abnormal results are displayed) Labs Reviewed - No data to display  EKG  EKG Interpretation None       Radiology No results found.  Procedures Procedures (including critical care time)  Medications Ordered in ED Medications  diphenhydrAMINE (BENADRYL) capsule 50 mg (50 mg Oral Given 04/18/16 1710)  predniSONE (DELTASONE) tablet 60 mg (60 mg Oral Given 04/18/16 1710)     Initial Impression / Assessment and Plan / ED Course  I have reviewed the triage vital signs and the nursing notes.  Pertinent labs & imaging results that were available during my care of the patient were reviewed by me and considered in my medical decision making (see chart for details).  Clinical Course   AZION JALBERT is a 45 y.o. female with h/o recurrent urticaria undergoing numerous outpatient work-ups over the past 3 years, has appointment with dermatology for bx on 04/27/16, presents to ED today for same,  as urticaria started last night, improved with benadryl, reoccurred after benadryl wore off, has not taken additional benadryl. Pt states she had her Mirena placed 3 years ago, thinks it may be related to it. Pt reports occasional suprapubic discomfort, no significant abdominal pain, no vaginal discharge. Has Rx Atarax and Prednisone 20mg , has not taken Atarax in >24 hours. Last took benadryl for her urticaria at 0400 today with resolution of sx, then reoccurred. No associated abdominal pain or shortness of breath. Pt reports vomit x 1 early this morning after taking benadryl, resolved and has not reoccurred. No current nausea. Pt endorses subjective lower lip fullness/swelling, none appreciated on exam. Doubt anaphylaxis as etiology of presentation. Given benadryl in ED and prednisone 60mg . Rx prednisone taper as below. Advised to return to ER for shortness of breath, worsening swelling of lips or of tongue, abdominal pain, persistent vomiting, or any other new, worse, or concerning symptoms.  Pt condition, course, and discharge were discussed with attending physician Dr. Merrily Pew.  Final Clinical Impressions(s) / ED Diagnoses   Final diagnoses:  Urticaria    New Prescriptions Discharge Medication List as of 04/18/2016  4:50 PM    START taking these medications   Details  predniSONE (STERAPRED UNI-PAK 48 TAB) 10 MG (48) TBPK tablet Take by mouth daily. Take 60 mg (6 tablets) QD x first 4 days Take 40 mg (4 tablets) QD x next 4 days Take 20 mg (2 tablets) QD x next 4 days Take 10 mg (1 tablet) QD x last 4 days, Starting Wed 04/18/2016, Until Fri 05/04/2016, Print         Paralee Cancel, MD 04/19/16 [id number]    Merrily Pew, MD 04/19/16 2319

## 2016-04-18 NOTE — ED Triage Notes (Signed)
Pt presents with onset of hives, lip and tongue swelling that began last night.  Pt had episode of vomiting last night; pt seen at PCP today and placed on prednisone.  Pt reports mild lower abdominal pain, has IUD in x 3 years. Pt reports numbness to tip of her tongue, reports scratchy throat.

## 2016-04-18 NOTE — Progress Notes (Signed)
Subjective:    Patient ID: Theresa Pratt, female    DOB: September 01, 1970, 45 y.o.   MRN: [id number]  HPI This is a 45 yo female who presents today with worsening urticaria. She noticed swelling of her lips yesterday afternoon and took a zyrtec and children's benadryl. Some decreased swelling of lips overnight. Throat does not feel swollen. No SOB or wheezing. Some intermittent nasal congestion. Feels fatigued. Had some almonds yesterday (has never had previous reaction) and bought a new lip stick and lip gloss yesterday that she used.    She was seen by me last week with worsening urticaria requiring prednisone (20 mg x 6 days). Had fewer skin lesions on prednisone. Took last dose yesterday morning.  Has appointment with dermatology on May 18, 2016.   Her grandmother died 5 days ago.   Past Medical History:  Diagnosis Date  . Allergic rhinitis    post gastric bypass  . Arthropathy    weight bearing (pre gastric bypass)  . DM (diabetes mellitus) ([place])    pre gastric bypass  . GERD (gastroesophageal reflux disease)    post gastric bypass  . HTN (hypertension)    Pre gastric bypass  . HTN (hypertension)    post gastric bypass  . IDA (iron deficiency anemia)    post gastric bypass (iron infusion by hematology-doesn't build stores -Dr. Humphrey Rolls)  . Morbid obesity ([place])    Pre gastric bypass  . 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 Oligomenorrhea    on Implanon (pre gastric bypass)  . Postpartum depression    post gastric bypass  . Pre-eclampsia    post gastric bypass   Past Surgical History:  Procedure Laterality Date  . CHOLECYSTECTOMY  08/2010  . 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 implanon placed  03/16/09   (3 years)  . rectal condyloma removal  2006   Family History  Problem Relation Age of Onset  . Coronary artery disease Father     2 stents; + smoker  . Hypertension Father   . Thyroid disease Brother   . Diabetes Maternal Grandmother   . Cirrhosis Paternal  Grandmother   . Alcohol abuse Paternal Grandmother   . Breast cancer      Paternal great aunt(obesity, smoker, EtOHic)  . Breast cancer      Maternal great aunt (deceased in 33's from same)  . Thyroid disease Other     cousins and grandmother  . Cancer Other     great aunt - lung  . Liver disease Paternal Aunt   . Cancer Cousin     liver, bile duct  . Cancer Mother     uterine   Social History  Substance Use Topics  . Smoking status: Never Smoker  . Smokeless tobacco: Never Used  . Alcohol use Yes     Comment: occasional      Review of Systems Per HPI    Objective:   Physical Exam  Constitutional: She is oriented to person, place, and time. She appears well-developed and well-nourished. No distress.  Appears fatigued.   HENT:  Head: Normocephalic and atraumatic.  Upper and lower lips with mild edema.   Eyes: Conjunctivae are normal.  Neck: Normal range of motion. Neck supple.  Cardiovascular: Normal rate.   Pulmonary/Chest: Effort normal.  Neurological: She is alert and oriented to person, place, and time.  Skin: Skin is warm and dry. She is not diaphoretic.  Large, scattered hives on upper legs and arms.  Psychiatric: She has a normal mood and affect. Her behavior is normal. Judgment and thought content normal.  Vitals reviewed.        BP 108/88   Pulse (!) 108   Wt 246 lb 12.8 oz (111.9 kg)   SpO2 98%   BMI 41.07 kg/m  Wt Readings from Last 3 Encounters:  04/18/16 246 lb 12.8 oz (111.9 kg)  04/11/16 246 lb 12.8 oz (111.9 kg)  03/12/16 243 lb (110.2 kg)    Assessment & Plan:  Discussed with Dr. Danise Mina 1. Chronic urticaria - will treat pending dermatology appointment in 9 days - predniSONE (DELTASONE) 20 MG tablet; Take 3 tablets x 2 days, then 2 tablets x 2 days, 1 tablet x 2 days  Dispense: 12 tablet; Refill: 0  2. Lip swelling - possibly from new lip makeup- encouraged her to discontinue use - she was instructed to go to the ER if she  experienced any acute swelling of her throat/SOB or wheeze - predniSONE (DELTASONE) 20 MG tablet; Take 3 tablets x 2 days, then 2 tablets x 2 days, 1 tablet x 2 days  Dispense: 12 tablet; Refill: 0   Clarene Reamer, FNP-BC  [facility]  04/18/2016 12:59 PM

## 2016-04-23 ENCOUNTER — Ambulatory Visit (INDEPENDENT_AMBULATORY_CARE_PROVIDER_SITE_OTHER): Payer: BC Managed Care – PPO

## 2016-04-23 DIAGNOSIS — Z23 Encounter for immunization: Secondary | ICD-10-CM

## 2016-04-30 ENCOUNTER — Encounter: Payer: Self-pay | Admitting: Family Medicine

## 2016-04-30 ENCOUNTER — Ambulatory Visit (INDEPENDENT_AMBULATORY_CARE_PROVIDER_SITE_OTHER): Payer: BC Managed Care – PPO | Admitting: Family Medicine

## 2016-04-30 VITALS — BP 134/85 | HR 105 | Resp 18 | Ht 65.0 in | Wt 253.0 lb

## 2016-04-30 DIAGNOSIS — F4322 Adjustment disorder with anxiety: Secondary | ICD-10-CM

## 2016-04-30 DIAGNOSIS — Z30432 Encounter for removal of intrauterine contraceptive device: Secondary | ICD-10-CM

## 2016-04-30 DIAGNOSIS — L508 Other urticaria: Secondary | ICD-10-CM | POA: Diagnosis not present

## 2016-04-30 DIAGNOSIS — N920 Excessive and frequent menstruation with regular cycle: Secondary | ICD-10-CM

## 2016-04-30 DIAGNOSIS — Z3009 Encounter for other general counseling and advice on contraception: Secondary | ICD-10-CM

## 2016-04-30 MED ORDER — ESCITALOPRAM OXALATE 5 MG PO TABS: 5.0000 mg | ORAL_TABLET | Freq: Every day | ORAL | 3 refills | Status: DC

## 2016-04-30 NOTE — Patient Instructions (Signed)

## 2016-04-30 NOTE — Assessment & Plan Note (Signed)
May require more definitive therapy, will see how her menses are without her IUD. Also, if becomes anemic, may desire hysterectomy.

## 2016-04-30 NOTE — Assessment & Plan Note (Signed)
?   Related to her IUD--will remove in case

## 2016-04-30 NOTE — Progress Notes (Signed)
   Subjective:    Patient ID: Theresa Pratt is a 45 y.o. female presenting with IUD Removal  on 04/30/2016  HPI: Has been having trouble with urticaria. Has had severe allergic reactions. These started about 2 years ago. Took Prednisone and her MD has suggested removal of her IUD. Previously had severe anemia. Has no cycles with Mirena and her anemia is much improved. Has had to have Prednisone since 10 days ago. Gained 10 pounds in 1 wks. Has stress at work. Interested in needing contraception and her partner might get fixed. Reports 25-30 pounds since implantation of Mirena. ? Allergic reaction to Zoloft.  Review of Systems  Constitutional: Negative for chills and fever.  Respiratory: Negative for shortness of breath.   Cardiovascular: Negative for chest pain.  Gastrointestinal: Negative for abdominal pain, nausea and vomiting.  Genitourinary: Negative for dysuria.  Skin: Negative for rash.      Objective:    BP 134/85 (BP Location: Left Arm, Patient Position: Sitting, Cuff Size: Large)   Pulse (!) 105   Resp 18   Ht 5\' 5"  (1.651 m)   Wt 253 lb (114.8 kg)   BMI 42.10 kg/m  Physical Exam  Constitutional: She is oriented to person, place, and time. She appears well-developed and well-nourished. No distress.  HENT:  Head: Normocephalic and atraumatic.  Eyes: No scleral icterus.  Neck: Neck supple.  Cardiovascular: Normal rate.   Pulmonary/Chest: Effort normal.  Abdominal: Soft.  Genitourinary:  Genitourinary Comments: BUS normal, vagina is pink and rugated, large ectropion noted. IUD strings noted  Neurological: She is alert and oriented to person, place, and time.  Skin: Skin is warm and dry.  Psychiatric: She has a normal mood and affect.    Procedure: Speculum placed inside vagina.  Cervix visualized.  Strings grasped with ring forceps.  IUD removed intact.     Assessment & Plan:   Problem List Items Addressed This Visit      Unprioritized   Menorrhagia     Adjustment disorder with anxiety    She is also worried her urticaria are related to her Zoloft--so have switched her to low dose lexapro and stopped her Zoloft.      Relevant Medications   escitalopram (LEXAPRO) 5 MG tablet   Chronic urticaria - Primary    ? Related to her IUD--will remove in case       Other Visit Diagnoses    Encounter for IUD removal       Encounter for other general counseling or advice on contraception       Have decided to try condoms only instead of OC's due to Birth control and urticaria. Husband to get vasectomy.      Total face-to-face time with patient: 25 minutes. Over 50% of encounter was spent on counseling and coordination of care.  Donnamae Jude 04/30/2016 3:03 PM

## 2016-04-30 NOTE — Assessment & Plan Note (Signed)
She is also worried her urticaria are related to her Zoloft--so have switched her to low dose lexapro and stopped her Zoloft.

## 2016-05-04 ENCOUNTER — Ambulatory Visit (INDEPENDENT_AMBULATORY_CARE_PROVIDER_SITE_OTHER): Payer: BC Managed Care – PPO | Admitting: Family Medicine

## 2016-05-04 ENCOUNTER — Encounter: Payer: Self-pay | Admitting: Family Medicine

## 2016-05-04 VITALS — BP 136/92 | HR 80 | Temp 97.6°F | Wt 251.8 lb

## 2016-05-04 DIAGNOSIS — L508 Other urticaria: Secondary | ICD-10-CM

## 2016-05-04 DIAGNOSIS — F4322 Adjustment disorder with anxiety: Secondary | ICD-10-CM

## 2016-05-04 DIAGNOSIS — R51 Headache: Secondary | ICD-10-CM | POA: Diagnosis not present

## 2016-05-04 DIAGNOSIS — R519 Headache, unspecified: Secondary | ICD-10-CM | POA: Insufficient documentation

## 2016-05-04 DIAGNOSIS — Z566 Other physical and mental strain related to work: Secondary | ICD-10-CM

## 2016-05-04 NOTE — Progress Notes (Signed)
Pre visit review using our clinic review tool, if applicable. No additional management support is needed unless otherwise documented below in the visit note. 

## 2016-05-04 NOTE — Progress Notes (Signed)
BP (!) 136/92   Pulse 80   Temp 97.6 F (36.4 C) (Oral)   Wt 251 lb 12 oz (114.2 kg)   LMP 05/02/2016   BMI 41.89 kg/m    CC: f/u itching Subjective:    Patient ID: Theresa Pratt, female    DOB: 27-Aug-1970, 45 y.o.   MRN: [id number]  HPI: Theresa Pratt is a 45 y.o. female presenting on 05/04/2016 for Pruritis   See prior note for details. Has been seen here and at ER for hives.   Saw dermatology for ongoing urticaria - initially thought stress induced. Recently had Mirena removed as possible contribution. Stopped zoloft as possible cause. Recommended allergist evaluation and if unrevealing, to return to derm for skin patch testing. On atarax 10-30mg  QHS PRN, allegra 180mg  daily, zantac 150mg  bid.   If unrevealing allergist eval, pt requests referral to urticarial center.   She suddenly felt better since Mirena was removed. However hives have returned last night across ankles and lower back associated with itching. Now worried about pseudotumor cerebri related to mirena. Notes improvement in anxiety, fatigue, vision. Intermittent headaches improved only with pseudophed. + tinnitus. Endorses some double vision worse with fatigue. Work stressors have improved. Since mirena removed, symptoms have improved. Now on lexapro 5mg  daily.   Has started going to therapy with husband.  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  Husband just found out he will be laid off in December  Has noticed increased urination without other UTI sxs.   Relevant past medical, surgical, family and social history reviewed and updated as indicated. Interim medical history since our last visit reviewed. Allergies and medications reviewed and updated. Current Outpatient Prescriptions on File Prior to Visit  Medication Sig  . cholecalciferol (VITAMIN D) 1000 UNITS tablet Take 1,000 Units by mouth daily.  Marland Kitchen escitalopram (LEXAPRO) 5 MG tablet Take 1 tablet (5 mg  total) by mouth daily.  . hydrochlorothiazide (HYDRODIURIL) 12.5 MG tablet Take 1 tablet (12.5 mg total) by mouth daily.  . Iron Combinations TABS Take 1 tablet by mouth daily.  . Multiple Vitamin (MULTIVITAMIN) tablet Take 1 tablet by mouth daily.    . cyclobenzaprine (FLEXERIL) 5 MG tablet Take 1 tablet (5 mg total) by mouth 3 (three) times daily as needed for muscle spasms. (Patient not taking: Reported on 05/04/2016)  . hydrOXYzine (ATARAX/VISTARIL) 25 MG tablet TAKE 1 TABLET (25 MG TOTAL) BY MOUTH 3 (THREE) TIMES DAILY AS NEEDED. (Patient not taking: Reported on 05/04/2016)  . meloxicam (MOBIC) 15 MG tablet Take 1 tablet (15 mg total) by mouth daily as needed for pain. (Patient not taking: Reported on 05/04/2016)  . ranitidine (ZANTAC) 150 MG tablet Take 150 mg by mouth 2 (two) times daily. Reported on 12/08/2015   No current facility-administered medications on file prior to visit.     Review of Systems Per HPI unless specifically indicated in ROS section     Objective:    BP (!) 136/92   Pulse 80   Temp 97.6 F (36.4 C) (Oral)   Wt 251 lb 12 oz (114.2 kg)   LMP 05/02/2016   BMI 41.89 kg/m   Wt Readings from Last 3 Encounters:  05/04/16 251 lb 12 oz (114.2 kg)  04/30/16 253 lb (114.8 kg)  04/18/16 246 lb (111.6 kg)    Physical Exam  Constitutional: She is oriented to person, place, and time. She appears well-developed and well-nourished. No distress.  HENT:  Mouth/Throat:  Oropharynx is clear and moist. No oropharyngeal exudate.  Eyes: Conjunctivae and EOM are normal. Pupils are equal, round, and reactive to light.  Limited fundoscopic exam - R brisk disc, L not visualized well  Neck: Normal range of motion. Neck supple.  Cardiovascular: Normal rate, regular rhythm, normal heart sounds and intact distal pulses.   No murmur heard. Pulmonary/Chest: Effort normal and breath sounds normal. No respiratory distress. She has no wheezes.  Musculoskeletal: She exhibits no edema.    Neurological: She is alert and oriented to person, place, and time. She has normal strength. No cranial nerve deficit or sensory deficit.  CN 2-12 intact EOMI FTN intact  Skin: Rash noted.  Mild erythematous macular rash R inner thigh  Psychiatric: She has a normal mood and affect.  Nursing note and vitals reviewed.  Results for orders placed or performed in visit on 04/11/16  Sedimentation rate  Result Value Ref Range   Sed Rate 17 0 - 20 mm/hr  ANA  Result Value Ref Range   Anit Nuclear Antibody(ANA) NEG NEGATIVE      Assessment & Plan:  Over 25 minutes were spent face-to-face with the patient during this encounter and >50% of that time was spent on counseling and coordination of care  Problem List Items Addressed This Visit    Adjustment disorder with anxiety    Work stressors are improving. Planning on finding new job. Now on lexapro and tolerating well.  ?ADD - pt will return for eval for this.       Chronic urticaria - Primary    Ongoing, improved after mirenal removal, s/p derm eval. Has allergist appt later today to try and identify environmental trigger. If unrevealing, requests urticaria specialist referral at [facility].       Headache    Pt endorses ongoing headaches, vision changes, tinnitus and occasional blurry vision.  Limited fundoscopic exam today stable. nonfocal neurological exam.  Will order MRI for further eval of possible pseudotumor.       Relevant Orders   MR Brain Wo Contrast   Work-related stress    Improving.        Other Visit Diagnoses   None.      Follow up plan: Return in about 4 weeks (around 06/01/2016), or as needed, for follow up visit.  [name], MD

## 2016-05-04 NOTE — Assessment & Plan Note (Signed)
Improving.

## 2016-05-04 NOTE — Assessment & Plan Note (Signed)
Pt endorses ongoing headaches, vision changes, tinnitus and occasional blurry vision.  Limited fundoscopic exam today stable. nonfocal neurological exam.  Will order MRI for further eval of possible pseudotumor.

## 2016-05-04 NOTE — Assessment & Plan Note (Signed)
Ongoing, improved after mirenal removal, s/p derm eval. Has allergist appt later today to try and identify environmental trigger. If unrevealing, requests urticaria specialist referral at [facility] or [place].

## 2016-05-04 NOTE — Assessment & Plan Note (Addendum)
Work stressors are improving. Planning on finding new job. Now on lexapro and tolerating well.  ?ADD - pt will return for eval for this.

## 2016-05-04 NOTE — Patient Instructions (Addendum)
Allergist evaluation today - if unrevealing, let me know for referral to hives specialist. Continue current urticaria regimen.  We will schedule MRI to evaluate for increased pressure.  Return in 3-4 wks for follow up and to start ADD evaluation.

## 2016-05-07 ENCOUNTER — Encounter: Payer: Self-pay | Admitting: *Deleted

## 2016-05-07 ENCOUNTER — Telehealth: Payer: Self-pay | Admitting: *Deleted

## 2016-05-07 DIAGNOSIS — R519 Headache, unspecified: Secondary | ICD-10-CM

## 2016-05-07 DIAGNOSIS — R51 Headache: Principal | ICD-10-CM

## 2016-05-07 NOTE — Telephone Encounter (Signed)
Order changed to correct order.

## 2016-05-16 ENCOUNTER — Other Ambulatory Visit: Payer: BC Managed Care – PPO

## 2016-05-16 ENCOUNTER — Ambulatory Visit
Admission: RE | Admit: 2016-05-16 | Discharge: 2016-05-16 | Disposition: A | Discharge status: Home / Self Care | Payer: BC Managed Care – PPO | Source: Ambulatory Visit | Attending: Family Medicine | Admitting: Family Medicine

## 2016-05-16 DIAGNOSIS — R519 Headache, unspecified: Secondary | ICD-10-CM

## 2016-05-16 DIAGNOSIS — R51 Headache: Principal | ICD-10-CM

## 2016-05-16 MED ORDER — GADOBENATE DIMEGLUMINE 529 MG/ML IV SOLN
20.0000 mL | Freq: Once | INTRAVENOUS | Status: AC | PRN
  Administered 2016-05-16: 20 mL via INTRAVENOUS

## 2016-05-29 ENCOUNTER — Encounter: Payer: Self-pay | Admitting: Family Medicine

## 2016-05-29 ENCOUNTER — Ambulatory Visit (INDEPENDENT_AMBULATORY_CARE_PROVIDER_SITE_OTHER): Payer: BC Managed Care – PPO | Admitting: Family Medicine

## 2016-05-29 VITALS — BP 144/94 | HR 96 | Temp 97.5°F | Wt 253.5 lb

## 2016-05-29 DIAGNOSIS — L508 Other urticaria: Secondary | ICD-10-CM

## 2016-05-29 DIAGNOSIS — Z566 Other physical and mental strain related to work: Secondary | ICD-10-CM

## 2016-05-29 DIAGNOSIS — R41840 Attention and concentration deficit: Secondary | ICD-10-CM

## 2016-05-29 DIAGNOSIS — F902 Attention-deficit hyperactivity disorder, combined type: Secondary | ICD-10-CM | POA: Insufficient documentation

## 2016-05-29 DIAGNOSIS — F4322 Adjustment disorder with anxiety: Secondary | ICD-10-CM

## 2016-05-29 MED ORDER — ATOMOXETINE HCL 40 MG PO CAPS: 40.0000 mg | ORAL_CAPSULE | Freq: Every day | ORAL | 3 refills | Status: DC

## 2016-05-29 NOTE — Progress Notes (Signed)
BP (!) 144/94   Pulse 96   Temp 97.5 F (36.4 C) (Oral)   Wt 253 lb 8 oz (115 kg)   LMP 05/02/2016   BMI 42.18 kg/m    CC: f/u visit, adult ADD evaluation  Subjective:    Patient ID: Theresa Pratt, female    DOB: 1970/10/10, 45 y.o.   MRN: [id number]  HPI: Theresa Pratt is a 45 y.o. female presenting on 05/29/2016 for Follow-up   See prior note for details. Due to headaches, tinnitus double vision after mirena IUD was started, pt was concerned for mirena induced pseudotumor. MRI was normal.   Saw local allergist (Dr Donneta Romberg) and dermatologist (Dr Allyson Sabal) for chronic urticaria, previously attributed to stress - had skin prick test and dermal test without immediate reaction, but she states her right arm broke out at home 2 hours after the initial testing. She did not let the allergist know about this and states she had a bad experience at that office.   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  Pt endorses persistent pattern of inattention and or hyperactivity/impulsivity that interferes with daily functioning. Symptoms present in 2 or more settings. Symptoms present before 45 years of age? Yes. "everything was a struggle for me with grades". No trouble in college  Inattention (6+, 6 months): Careless mistakes? Yes  Difficulty sustaining attention? No, but has many interruptions during the day Doesn't listen when spoken to directly? No  Lacks follow through with instructions or difficulty completing tasks? Yes  Difficulty organizing tasks/activities? No  Avoiding tasks that require sustained attention? Yes  Easily losing things needed for tasks? Yes  Easily distracted by external stimuli? Yes  Forgetful in daily activities? Yes  Hyperactive/impulsive (6+, 6 months): Fidgeting, tapping hands/feet? Yes  Leaves seat when expected to remain in seat? Yes  Feeling restless, or running around when not expected? Yes  Unable to engage in leisurely  activities quietly? No Always on the go, "driven by a motor"? Yes Excessive talking? Yes Blurting out answer, responds to other's questions? Yes  Difficulty waiting in line or waiting turn? No  Interrupting or intruding on others? Yes  Relevant past medical, surgical, family and social history reviewed and updated as indicated. Interim medical history since our last visit reviewed. Allergies and medications reviewed and updated. Current Outpatient Prescriptions on File Prior to Visit  Medication Sig  . cholecalciferol (VITAMIN D) 1000 UNITS tablet Take 1,000 Units by mouth daily.  . cyclobenzaprine (FLEXERIL) 5 MG tablet Take 1 tablet (5 mg total) by mouth 3 (three) times daily as needed for muscle spasms.  Marland Kitchen escitalopram (LEXAPRO) 5 MG tablet Take 1 tablet (5 mg total) by mouth daily.  . hydrochlorothiazide (HYDRODIURIL) 12.5 MG tablet Take 1 tablet (12.5 mg total) by mouth daily.  . hydrOXYzine (ATARAX/VISTARIL) 25 MG tablet TAKE 1 TABLET (25 MG TOTAL) BY MOUTH 3 (THREE) TIMES DAILY AS NEEDED.  . Iron Combinations TABS Take 1 tablet by mouth daily.  . meloxicam (MOBIC) 15 MG tablet Take 1 tablet (15 mg total) by mouth daily as needed for pain.  . Multiple Vitamin (MULTIVITAMIN) tablet Take 1 tablet by mouth daily.    . ranitidine (ZANTAC) 150 MG tablet Take 150 mg by mouth 2 (two) times daily. Reported on 12/08/2015   No current facility-administered medications on file prior to visit.     Review of Systems Per HPI unless specifically indicated in ROS section     Objective:  BP (!) 144/94   Pulse 96   Temp 97.5 F (36.4 C) (Oral)   Wt 253 lb 8 oz (115 kg)   LMP 05/02/2016   BMI 42.18 kg/m   Wt Readings from Last 3 Encounters:  05/29/16 253 lb 8 oz (115 kg)  05/04/16 251 lb 12 oz (114.2 kg)  04/30/16 253 lb (114.8 kg)    Physical Exam  Constitutional: She appears well-developed and well-nourished. No distress.  Psychiatric: She has a normal mood and affect.  Nursing  note and vitals reviewed.  Results for orders placed or performed in visit on 04/11/16  Sedimentation rate  Result Value Ref Range   Sed Rate 17 0 - 20 mm/hr  ANA  Result Value Ref Range   Anit Nuclear Antibody(ANA) NEG NEGATIVE      Assessment & Plan:  Over 25 minutes were spent face-to-face with the patient during this encounter and >50% of that time was spent on counseling and coordination of care  Problem List Items Addressed This Visit    Adjustment disorder with anxiety    Continue lexapro and counseling.       Chronic urticaria    Improvement after mirena removal but pt notes ongoing urticaria. S/p derm and allergist evaluation. ?stress related urticaria. Requests referral to urticarial specialist - placed. I have asked her to contact the allergist office to make them aware of her delayed reaction to dermal skin test.       Relevant Orders   Ambulatory referral to Allergy   Inattention - Primary    Meets criteria for adult ADHD. Will refer for formal testing. In interim, will start straterra 40mg  daily.       Relevant Orders   Ambulatory referral to Psychology   Work-related stress    Ongoing. Pt planning on finding new job.          Follow up plan: Return if symptoms worsen or fail to improve.  [name], MD

## 2016-05-29 NOTE — Progress Notes (Signed)
Pre visit review using our clinic review tool, if applicable. No additional management support is needed unless otherwise documented below in the visit note. 

## 2016-05-29 NOTE — Assessment & Plan Note (Signed)
Meets criteria for adult ADHD. Will refer for formal testing. In interim, will start straterra 40mg  daily.

## 2016-05-29 NOTE — Patient Instructions (Addendum)
Let me know the name of the urticaria specialist you would like to see. Pass by Marion's office for referral for formal ADHD testing. Trial strattera 40mg  daily for concentration, let us know how you're tolerating medicine.   Atomoxetine capsules What is this medicine? ATOMOXETINE (AT oh mox e teen) is used to treat attention deficit/hyperactivity disorder, also known as ADHD. It is not a stimulant like other drugs for ADHD. This drug can improve attention span, concentration, and emotional control. It can also reduce restless or overactive behavior. This medicine may be used for other purposes; ask your health care provider or pharmacist if you have questions. COMMON BRAND NAME(S): Strattera What should I tell my health care provider before I take this medicine? They need to know if you have any of these conditions: -glaucoma -high or low blood pressure -history of stroke -irregular heartbeat or other cardiac disease -liver disease -mania or bipolar disorder -pheochromocytoma -suicidal thoughts -an unusual or allergic reaction to atomoxetine, other medicines, foods, dyes, or preservatives -pregnant or trying to get pregnant -breast-feeding How should I use this medicine? Take this medicine by mouth with a glass of water. Follow the directions on the prescription label. You can take it with or without food. If it upsets your stomach, take it with food. If you have difficulty sleeping and you take more than 1 dose per day, take your last dose before 6 PM. Take your medicine at regular intervals. Do not take it more often than directed. Do not stop taking except on your doctor's advice. A special MedGuide will be given to you by the pharmacist with each prescription and refill. Be sure to read this information carefully each time. Talk to your pediatrician regarding the use of this medicine in children. While this drug may be prescribed for children as young as 6 years for selected conditions,  precautions do apply. Overdosage: If you think you have taken too much of this medicine contact a poison control center or emergency room at once. NOTE: This medicine is only for you. Do not share this medicine with others. What if I miss a dose? If you miss a dose, take it as soon as you can. If it is almost time for your next dose, take only that dose. Do not take double or extra doses. What may interact with this medicine? Do not take this medicine with any of the following medications: -cisapride -dofetilide -dronedarone -MAOIs like Carbex, Eldepryl, Marplan, Nardil, and Parnate -pimozide -reboxetine -thioridazine -ziprasidone This medicine may also interact with the following medications: -certain medicines for blood pressure, heart disease, irregular heart beat -certain medicines for depression, anxiety, or psychotic disturbances -certain medicines for lung disease like albuterol -cold or allergy medicines -fluoxetine -medicines that increase blood pressure like dopamine, dobutamine, or ephedrine -other medicines that prolong the QT interval (cause an abnormal heart rhythm) -paroxetine -quinidine -stimulant medicines for attention disorders, weight loss, or to stay awake This list may not describe all possible interactions. Give your health care provider a list of all the medicines, herbs, non-prescription drugs, or dietary supplements you use. Also tell them if you smoke, drink alcohol, or use illegal drugs. Some items may interact with your medicine. What should I watch for while using this medicine? It may take a week or more for this medicine to take effect. This is why it is very important to continue taking the medicine and not miss any doses. If you have been taking this medicine regularly for some time, do not  suddenly stop taking it. Ask your doctor or health care professional for advice. Rarely, this medicine may increase thoughts of suicide or suicide attempts in  children and teenagers. Call your child's health care professional right away if your child or teenager has new or increased thoughts of suicide or has changes in mood or behavior like becoming irritable or anxious. Regularly monitor your child for these behavioral changes. For males, contact you doctor or health care professional right away if you have an erection that lasts longer than 4 hours or if it becomes painful. This may be a sign of serious problem and must be treated right away to prevent permanent damage. You may get drowsy or dizzy. Do not drive, use machinery, or do anything that needs mental alertness until you know how this medicine affects you. Do not stand or sit up quickly, especially if you are an older patient. This reduces the risk of dizzy or fainting spells. Alcohol can make you more drowsy and dizzy. Avoid alcoholic drinks. Do not treat yourself for coughs, colds or allergies without asking your doctor or health care professional for advice. Some ingredients can increase possible side effects. Your mouth may get dry. Chewing sugarless gum or sucking hard candy, and drinking plenty of water will help. What side effects may I notice from receiving this medicine? Side effects that you should report to your doctor or health care professional as soon as possible: -allergic reactions like skin rash, itching or hives, swelling of the face, lips, or tongue -breathing problems -chest pain -dark urine -fast, irregular heartbeat -general ill feeling or flu-like symptoms -high blood pressure -males: prolonged or painful erection -stomach pain or tenderness -trouble passing urine or change in the amount of urine -vomiting -weight loss -yellowing of the eyes or skin Side effects that usually do not require medical attention (report to your doctor or health care professional if they continue or are bothersome): -change in sex drive or performance -constipation or  diarrhea -headache -loss of appetite -menstrual period irregularities -nausea -stomach upset This list may not describe all possible side effects. Call your doctor for medical advice about side effects. You may report side effects to FDA at 1-800-FDA-1088. Where should I keep my medicine? Keep out of the reach of children. Store at room temperature between 15 and 30 degrees C (59 and 86 degrees F). Throw away any unused medication after the expiration date. NOTE: This sheet is a summary. It may not cover all possible information. If you have questions about this medicine, talk to your doctor, pharmacist, or health care provider.  2017 Elsevier/Gold Standard (2013-11-13 15:29:22)

## 2016-05-30 NOTE — Assessment & Plan Note (Signed)
Ongoing. Pt planning on finding new job.

## 2016-05-30 NOTE — Assessment & Plan Note (Addendum)
Improvement after mirena removal but pt notes ongoing urticaria. S/p derm and allergist evaluation. ?stress related urticaria. Requests referral to urticarial specialist - placed. I have asked her to contact the allergist office to make them aware of her delayed reaction to dermal skin test.

## 2016-05-30 NOTE — Assessment & Plan Note (Signed)
Continue lexapro and counseling.

## 2016-06-04 NOTE — Assessment & Plan Note (Deleted)
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

## 2016-06-05 ENCOUNTER — Other Ambulatory Visit: Payer: Self-pay

## 2016-06-05 ENCOUNTER — Ambulatory Visit: Payer: BC Managed Care – PPO | Admitting: Obstetrics and Gynecology

## 2016-06-05 DIAGNOSIS — D509 Iron deficiency anemia, unspecified: Secondary | ICD-10-CM

## 2016-06-06 ENCOUNTER — Ambulatory Visit: Payer: BC Managed Care – PPO | Admitting: Hematology and Oncology

## 2016-06-06 ENCOUNTER — Other Ambulatory Visit: Payer: BC Managed Care – PPO

## 2016-06-11 ENCOUNTER — Ambulatory Visit: Payer: BC Managed Care – PPO | Admitting: Hematology and Oncology

## 2016-06-11 ENCOUNTER — Other Ambulatory Visit: Payer: BC Managed Care – PPO

## 2016-06-27 ENCOUNTER — Ambulatory Visit: Payer: BC Managed Care – PPO | Admitting: Family Medicine

## 2016-07-13 ENCOUNTER — Ambulatory Visit (INDEPENDENT_AMBULATORY_CARE_PROVIDER_SITE_OTHER): Payer: BC Managed Care – PPO | Admitting: Family Medicine

## 2016-07-13 ENCOUNTER — Encounter: Payer: Self-pay | Admitting: Family Medicine

## 2016-07-13 VITALS — BP 135/96 | HR 92 | Ht 66.0 in | Wt 256.0 lb

## 2016-07-13 DIAGNOSIS — Z01419 Encounter for gynecological examination (general) (routine) without abnormal findings: Secondary | ICD-10-CM | POA: Diagnosis not present

## 2016-07-13 DIAGNOSIS — Z0000 Encounter for general adult medical examination without abnormal findings: Secondary | ICD-10-CM | POA: Diagnosis not present

## 2016-07-13 DIAGNOSIS — I10 Essential (primary) hypertension: Secondary | ICD-10-CM

## 2016-07-13 DIAGNOSIS — Z803 Family history of malignant neoplasm of breast: Secondary | ICD-10-CM

## 2016-07-13 MED ORDER — HYDROCHLOROTHIAZIDE 12.5 MG PO TABS: 12.5000 mg | ORAL_TABLET | Freq: Every day | ORAL | 0 refills | Status: DC

## 2016-07-13 NOTE — Patient Instructions (Signed)
Contraceptive Implant Information A contraceptive implant is a plastic rod that is inserted under your skin. It is usually inserted under the skin of your upper arm. It continually releases small amounts of progestin (synthetic progesterone) into your bloodstream. This prevents an egg from being released from your ovaries. It also thickens your cervical mucus to prevent sperm from entering the cervix, and it thins your uterine lining to prevent a fertilized egg from attaching to your uterus. Contraceptive implants can be effective for up to 3 years. They do not provide protection against sexually transmitted diseases (STDs).  The procedure to insert an implant usually takes about 10 minutes. There may be minor bruising, swelling, and discomfort at the insertion site for a couple days. The implant begins to work within the first day. Other contraceptive protection may be necessary for 7 days. Be sure to discuss with your health care provider if you need a backup method of contraception.  Your health care provider will make sure you are a good candidate for the contraceptive implant. Discuss with your health care provider the possible side effects of the implant. ADVANTAGES  It prevents pregnancy for up to 3 years.  It is easily reversible.  It is convenient.  It can be used when breastfeeding.  It can be used by women who cannot take estrogen. DISADVANTAGES  You may have irregular or unplanned vaginal bleeding.  You may develop side effects, including headache, weight gain, acne, breast tenderness, or mood changes.  You may have tissue or nerve damage after insertion (rare).  It may be difficult and uncomfortable to remove.  Certain medicines may interfere with the effectiveness of the implant. REMOVAL OF IMPLANT The implant should be removed in 3 years or as directed by your health care provider. The implant's effect wears off in a few hours after removal. Your ability to get pregnant  (fertility) may be restored in 1-2 weeks. A new implant can be inserted as soon as the old one is removed if desired. CONTRAINDICATIONS You should not get the implant if you are experiencing any of the following situations:  You are pregnant.  You have a history of breast cancer, osteoporosis, blood clots, heart disease, diabetes, high blood pressure, liver disease, tumors, or stroke.   You have undiagnosed vaginal bleeding.  You have a sensitivity to any part of the implant. This information is not intended to replace advice given to you by your health care provider. Make sure you discuss any questions you have with your health care provider. Document Released: 06/21/2011 Document Revised: 03/04/2013 Document Reviewed: 12/29/2012 Elsevier Interactive Patient Education  2017 [employer].

## 2016-07-13 NOTE — Progress Notes (Signed)
CLINIC ENCOUNTER NOTE  History:  45 y.o. [id number] here today for routine GYN/Well woman exam. She denies any abnormal vaginal discharge, bleeding, pelvic pain or other concerns.   Past Medical History:  Diagnosis Date  . Allergic rhinitis    post gastric bypass  . Arthropathy    weight bearing (pre gastric bypass)  . DM (diabetes mellitus) ([place])    pre gastric bypass  . GERD (gastroesophageal reflux disease)    post gastric bypass  . HTN (hypertension)    Pre gastric bypass  . HTN (hypertension)    post gastric bypass  . IDA (iron deficiency anemia)    post gastric bypass (iron infusion by hematology-doesn't build stores -Dr. Humphrey Rolls)  . Morbid obesity ([place])    Pre gastric bypass  . 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 Oligomenorrhea    on Implanon (pre gastric bypass)  . Postpartum depression    post gastric bypass  . Pre-eclampsia    post gastric bypass    Past Surgical History:  Procedure Laterality Date  . CHOLECYSTECTOMY  08/2010  . 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 implanon placed  03/16/09   (3 years)  . rectal condyloma removal  2006    The following portions of the patient's history were reviewed and updated as appropriate: allergies, current medications, past family history, past medical history, past social history, past surgical history and problem list.   Health Maintenance:  Normal pap and negative HRHPV on 05/25/2015  Normal mammogram on 06/27/2015.   There are no preventive care reminders to display for this patient.   Review of Systems:  Pertinent items noted in HPI and remainder of comprehensive ROS otherwise negative.   Objective:  Physical Exam BP (!) 135/96   Pulse 92   Ht _0  (1.676 m)   Wt 256 lb (116.1 kg)   LMP 07/02/2016 (Exact Date)   BMI 41.32 kg/m  CONSTITUTIONAL: Well-developed, well-nourished female in no acute distress.  HENT:  Normocephalic, atraumatic. External right and left ear normal. Oropharynx is clear and  moist EYES: Conjunctivae and EOM are normal. Pupils are equal, round, and reactive to light. No scleral icterus.  NECK: Normal range of motion, supple, no masses SKIN: Skin is warm and dry. No rash noted. Not diaphoretic. No erythema. No pallor. Dare: Alert and oriented to person, place, and time. Normal reflexes, muscle tone coordination. No cranial nerve deficit noted. PSYCHIATRIC: Normal mood and affect. Normal behavior. Normal judgment and thought content. CARDIOVASCULAR: Normal heart rate noted RESPIRATORY: Effort and breath sounds normal, no problems with respiration noted ABDOMEN: Soft, no distention noted.   PELVIC: Normal appearing external genitalia; normal appearing vaginal mucosa and cervix.  No abnormal discharge noted.  Normal uterine size, no other palpable masses, no uterine or adnexal tenderness. MUSCULOSKELETAL: Normal range of motion. No edema noted.  Labs and Imaging No results found.  Assessment & Plan:  1. Well woman exam with routine gynecological exam - Routine care today - Reviewed importance of weight maintenance and exercise - No need for pap today - BCM- continues to have regular cycle and is sexually active, Liked nexplanon and would like this again-- reviewed risk/benefits and alternatives. She will think about this and return for placement  2. Essential hypertension - hydrochlorothiazide (HYDRODIURIL) 12.5 MG tablet; Take 1 tablet (12.5 mg total) by mouth daily.  Dispense: 30 tablet; Refill: 0  3. Family history of breast cancer - Integrated [facility] ([employer] [employer])   Routine preventative  health maintenance measures emphasized. Please refer to After Visit Summary for other counseling recommendations.   Return in about 2 weeks (around 07/27/2016) for nexplanon placement, needs UPT before.  Future Appointments Date Time Provider [place]  08/07/2016 3:15 PM Osborne Oman, MD [facility]

## 2016-07-17 ENCOUNTER — Encounter: Payer: Self-pay | Admitting: *Deleted

## 2016-08-07 ENCOUNTER — Ambulatory Visit: Payer: BC Managed Care – PPO | Admitting: Obstetrics & Gynecology

## 2016-08-07 DIAGNOSIS — Z3046 Encounter for surveillance of implantable subdermal contraceptive: Secondary | ICD-10-CM

## 2016-09-02 ENCOUNTER — Other Ambulatory Visit: Payer: Self-pay | Admitting: Family Medicine

## 2016-09-02 DIAGNOSIS — F4322 Adjustment disorder with anxiety: Secondary | ICD-10-CM

## 2016-12-07 ENCOUNTER — Encounter: Payer: Self-pay | Admitting: Family Medicine

## 2016-12-07 ENCOUNTER — Ambulatory Visit (INDEPENDENT_AMBULATORY_CARE_PROVIDER_SITE_OTHER): Payer: BC Managed Care – PPO | Admitting: Family Medicine

## 2016-12-07 VITALS — BP 136/78 | HR 80 | Temp 97.8°F | Wt 252.0 lb

## 2016-12-07 DIAGNOSIS — I10 Essential (primary) hypertension: Secondary | ICD-10-CM | POA: Diagnosis not present

## 2016-12-07 DIAGNOSIS — L508 Other urticaria: Secondary | ICD-10-CM

## 2016-12-07 DIAGNOSIS — M79671 Pain in right foot: Secondary | ICD-10-CM | POA: Diagnosis not present

## 2016-12-07 MED ORDER — HYDROCHLOROTHIAZIDE 12.5 MG PO TABS: 12.5000 mg | ORAL_TABLET | Freq: Every day | ORAL | 2 refills | Status: DC

## 2016-12-07 NOTE — Progress Notes (Signed)
BP 136/78   Pulse 80   Temp 97.8 F (36.6 C) (Oral)   Wt 252 lb (114.3 kg)   SpO2 98%   BMI 40.67 kg/m    CC: R foot pain Subjective:    Patient ID: Theresa Pratt, female    DOB: 1971-01-22, 46 y.o.   MRN: [id number]  HPI: Theresa Pratt is a 46 y.o. female presenting on 12/07/2016 for Foot Pain (right)   Intermittent R lateral foot pain and swelling noted over last 1-2 wks. It was also erythematous. No warmth. Treating with tylenol and ibuprofen which was helpful. Denies inciting trauma/injury or falls.  Previously improved with foot massage.   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 Now off this. New job at [employer], [place] off - less stress.   Relevant past medical, surgical, family and social history reviewed and updated as indicated. Interim medical history since our last visit reviewed. Allergies and medications reviewed and updated. Outpatient Medications Prior to Visit  Medication Sig Dispense Refill  . atomoxetine (STRATTERA) 40 MG capsule Take 1 capsule (40 mg total) by mouth daily. 30 capsule 3  . cholecalciferol (VITAMIN D) 1000 UNITS tablet Take 1,000 Units by mouth daily.    . cyclobenzaprine (FLEXERIL) 5 MG tablet Take 1 tablet (5 mg total) by mouth 3 (three) times daily as needed for muscle spasms. 30 tablet 0  . escitalopram (LEXAPRO) 5 MG tablet TAKE 1 TABLET (5 MG TOTAL) BY MOUTH DAILY. 30 tablet 3  . hydrOXYzine (ATARAX/VISTARIL) 25 MG tablet TAKE 1 TABLET (25 MG TOTAL) BY MOUTH 3 (THREE) TIMES DAILY AS NEEDED. 90 tablet 0  . Iron Combinations TABS Take 1 tablet by mouth daily.    . meloxicam (MOBIC) 15 MG tablet Take 1 tablet (15 mg total) by mouth daily as needed for pain. 15 tablet 0  . Multiple Vitamin (MULTIVITAMIN) tablet Take 1 tablet by mouth daily.      . ranitidine (ZANTAC) 150 MG tablet Take 150 mg by mouth 2 (two) times daily. Reported on 12/08/2015    . hydrochlorothiazide (HYDRODIURIL) 12.5 MG tablet Take 1 tablet (12.5 mg  total) by mouth daily. 30 tablet 0  . predniSONE (DELTASONE) 20 MG tablet TAKE 2 TABLETS (40 MG TOTAL) BY MOUTH DAILY FOR 5 DAYS.  0   No facility-administered medications prior to visit.      Per HPI unless specifically indicated in ROS section below Review of Systems     Objective:    BP 136/78   Pulse 80   Temp 97.8 F (36.6 C) (Oral)   Wt 252 lb (114.3 kg)   SpO2 98%   BMI 40.67 kg/m   Wt Readings from Last 3 Encounters:  12/07/16 252 lb (114.3 kg)  07/13/16 256 lb (116.1 kg)  05/29/16 253 lb 8 oz (115 kg)    Physical Exam  Constitutional: She appears well-developed and well-nourished. No distress.  Musculoskeletal: She exhibits no edema.  L foot WNL R foot - tender to palpation lateral midfoot with mild swelling distal lateral 5th MT 2+ DP bilaterally No heel pain, no pain with calcaneal squeeze, FROM at ankle without laxity There is loss of longitudinal arches bilaterally  Skin: Skin is warm and dry. No rash noted.  Nursing note and vitals reviewed.  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 Also changed jobs - now less stress.       Essential hypertension   Relevant Medications   hydrochlorothiazide (HYDRODIURIL) 12.5 MG tablet   Right foot pain - Primary    Possible peroneus tendonitis. She also likely has poor foot mechanics contributing.  Treat with ibuprofen, ice, discussed importance of supportive shoes for work days standing for prolonged periods of time.  Update if not improving with treatment.          Follow up plan: Return if symptoms worsen or fail to improve.  [name], MD

## 2016-12-07 NOTE — Patient Instructions (Addendum)
I think you did have foot tendonitis as well as loss of longitudinal arch.  Treat with ibuprofen 400mg  twice daily with meals for 5 days  Look into arch support insoles.  Gentle stretching of the foot.  Ice to foot will help.

## 2016-12-07 NOTE — Assessment & Plan Note (Signed)
Thought stress induced. In remission on cyclosporine. Also changed jobs - now less stress.

## 2016-12-07 NOTE — Assessment & Plan Note (Addendum)
Possible peroneus tendonitis. She also likely has poor foot mechanics contributing.  Treat with ibuprofen, ice, discussed importance of supportive shoes for work days standing for prolonged periods of time.  Update if not improving with treatment.

## 2016-12-13 ENCOUNTER — Other Ambulatory Visit: Payer: Self-pay | Admitting: Family Medicine

## 2016-12-13 ENCOUNTER — Other Ambulatory Visit (INDEPENDENT_AMBULATORY_CARE_PROVIDER_SITE_OTHER): Payer: BC Managed Care – PPO

## 2016-12-13 DIAGNOSIS — Z9884 Bariatric surgery status: Secondary | ICD-10-CM

## 2016-12-13 DIAGNOSIS — E559 Vitamin D deficiency, unspecified: Secondary | ICD-10-CM | POA: Diagnosis not present

## 2016-12-13 DIAGNOSIS — K909 Intestinal malabsorption, unspecified: Secondary | ICD-10-CM

## 2016-12-13 DIAGNOSIS — E042 Nontoxic multinodular goiter: Secondary | ICD-10-CM

## 2016-12-13 LAB — COMPREHENSIVE METABOLIC PANEL
ALK PHOS: 109 U/L (ref 39–117)
ALT: 15 U/L (ref 0–35)
AST: 15 U/L (ref 0–37)
Albumin: 3.9 g/dL (ref 3.5–5.2)
BILIRUBIN TOTAL: 0.6 mg/dL (ref 0.2–1.2)
BUN: 11 mg/dL (ref 6–23)
CO2: 29 meq/L (ref 19–32)
Calcium: 9.2 mg/dL (ref 8.4–10.5)
Chloride: 105 mEq/L (ref 96–112)
Creatinine, Ser: 0.84 mg/dL (ref 0.40–1.20)
GFR: 93.07 mL/min (ref >60.00)
GLUCOSE: 98 mg/dL (ref 70–99)
Potassium: 4.2 mEq/L (ref 3.5–5.1)
SODIUM: 139 meq/L (ref 135–145)
TOTAL PROTEIN: 6.9 g/dL (ref 6.0–8.3)

## 2016-12-13 LAB — LIPID PANEL
CHOL/HDL RATIO: 3
Cholesterol: 193 mg/dL (ref 0–200)
HDL: 56.1 mg/dL (ref >39.00)
LDL Cholesterol: 108 mg/dL — ABNORMAL HIGH (ref 0–99)
NONHDL: 137.29
Triglycerides: 148 mg/dL (ref 0.0–149.0)
VLDL: 29.6 mg/dL (ref 0.0–40.0)

## 2016-12-13 LAB — FERRITIN: Ferritin: 14.6 ng/mL (ref 10.0–291.0)

## 2016-12-13 LAB — IBC PANEL
Iron: 110 ug/dL (ref 42–145)
SATURATION RATIOS: 25.2 % (ref 20.0–50.0)
Transferrin: 312 mg/dL (ref 212.0–360.0)

## 2016-12-13 LAB — CBC WITH DIFFERENTIAL/PLATELET
BASOS PCT: 0 % (ref 0.0–3.0)
Basophils Absolute: 0 10*3/uL (ref 0.0–0.1)
EOS ABS: 0.2 10*3/uL (ref 0.0–0.7)
EOS PCT: 1.7 % (ref 0.0–5.0)
HEMATOCRIT: 41.3 % (ref 36.0–46.0)
HEMOGLOBIN: 13.3 g/dL (ref 12.0–15.0)
LYMPHS PCT: 14.4 % (ref 12.0–46.0)
Lymphs Abs: 1.6 10*3/uL (ref 0.7–4.0)
MCHC: 32.1 g/dL (ref 30.0–36.0)
MCV: 83.3 fl (ref 78.0–100.0)
MONO ABS: 0.8 10*3/uL (ref 0.1–1.0)
Monocytes Relative: 7.2 % (ref 3.0–12.0)
Neutro Abs: 8.5 10*3/uL — ABNORMAL HIGH (ref 1.4–7.7)
Neutrophils Relative %: 76.7 % (ref 43.0–77.0)
Platelets: 389 10*3/uL (ref 150.0–400.0)
RBC: 4.95 Mil/uL (ref 3.87–5.11)
RDW: 14.4 % (ref 11.5–15.5)
WBC: 11 10*3/uL — AB (ref 4.0–10.5)

## 2016-12-13 LAB — VITAMIN B12: VITAMIN B 12: 262 pg/mL (ref 211–911)

## 2016-12-13 LAB — VITAMIN D 25 HYDROXY (VIT D DEFICIENCY, FRACTURES): VITD: 14.04 ng/mL — ABNORMAL LOW (ref 30.00–100.00)

## 2016-12-13 LAB — TSH: TSH: 0.99 u[IU]/mL (ref 0.35–4.50)

## 2016-12-13 LAB — T4, FREE: FREE T4: 0.91 ng/dL (ref 0.60–1.60)

## 2016-12-21 ENCOUNTER — Ambulatory Visit (INDEPENDENT_AMBULATORY_CARE_PROVIDER_SITE_OTHER): Payer: BC Managed Care – PPO | Admitting: Family Medicine

## 2016-12-21 ENCOUNTER — Encounter: Payer: Self-pay | Admitting: Family Medicine

## 2016-12-21 VITALS — BP 136/90 | HR 87 | Temp 97.8°F | Ht 66.0 in | Wt 256.0 lb

## 2016-12-21 DIAGNOSIS — F4322 Adjustment disorder with anxiety: Secondary | ICD-10-CM | POA: Diagnosis not present

## 2016-12-21 DIAGNOSIS — Z9884 Bariatric surgery status: Secondary | ICD-10-CM

## 2016-12-21 DIAGNOSIS — J029 Acute pharyngitis, unspecified: Secondary | ICD-10-CM | POA: Diagnosis not present

## 2016-12-21 DIAGNOSIS — I10 Essential (primary) hypertension: Secondary | ICD-10-CM | POA: Diagnosis not present

## 2016-12-21 DIAGNOSIS — D509 Iron deficiency anemia, unspecified: Secondary | ICD-10-CM | POA: Diagnosis not present

## 2016-12-21 DIAGNOSIS — E538 Deficiency of other specified B group vitamins: Secondary | ICD-10-CM | POA: Insufficient documentation

## 2016-12-21 DIAGNOSIS — N63 Unspecified lump in unspecified breast: Secondary | ICD-10-CM | POA: Diagnosis not present

## 2016-12-21 DIAGNOSIS — Z0000 Encounter for general adult medical examination without abnormal findings: Secondary | ICD-10-CM | POA: Diagnosis not present

## 2016-12-21 DIAGNOSIS — R41840 Attention and concentration deficit: Secondary | ICD-10-CM

## 2016-12-21 DIAGNOSIS — E559 Vitamin D deficiency, unspecified: Secondary | ICD-10-CM | POA: Diagnosis not present

## 2016-12-21 DIAGNOSIS — L508 Other urticaria: Secondary | ICD-10-CM | POA: Diagnosis not present

## 2016-12-21 DIAGNOSIS — K909 Intestinal malabsorption, unspecified: Secondary | ICD-10-CM | POA: Diagnosis not present

## 2016-12-21 DIAGNOSIS — Z566 Other physical and mental strain related to work: Secondary | ICD-10-CM

## 2016-12-21 LAB — POCT RAPID STREP A (OFFICE): RAPID STREP A SCREEN: NEGATIVE

## 2016-12-21 MED ORDER — CYANOCOBALAMIN 1000 MCG/ML IJ SOLN
1000.0000 ug | Freq: Once | INTRAMUSCULAR | Status: AC
  Administered 2016-12-21: 1000 ug via INTRAMUSCULAR

## 2016-12-21 MED ORDER — ATOMOXETINE HCL 40 MG PO CAPS: 40.0000 mg | ORAL_CAPSULE | Freq: Every day | ORAL | 3 refills | Status: DC

## 2016-12-21 MED ORDER — VITAMIN B-12 1000 MCG PO TABS: 1000.0000 ug | ORAL_TABLET | Freq: Every day | ORAL | Status: DC

## 2016-12-21 MED ORDER — ESCITALOPRAM OXALATE 5 MG PO TABS: 5.0000 mg | ORAL_TABLET | Freq: Every day | ORAL | 3 refills | Status: DC

## 2016-12-21 MED ORDER — HYDROCHLOROTHIAZIDE 12.5 MG PO TABS: 12.5000 mg | ORAL_TABLET | Freq: Every day | ORAL | 3 refills | Status: DC

## 2016-12-21 NOTE — Assessment & Plan Note (Signed)
strattera refilled. She has met criteria for adult ADHD. Previously was referred for formal testing, unclear if completed.

## 2016-12-21 NOTE — Assessment & Plan Note (Signed)
Appreciate [facility] allergist evaluation and management. Stable period with decreased stress with new job. I suggested she f/u with allergy to discuss simplified regimen and if deemed appropriate by allergy, we could continue to follow here. She finds singulair has been most effective medication.

## 2016-12-21 NOTE — Patient Instructions (Addendum)
Call to schedule mammogram at the [facility] as you're due.  Schedule eye exam. b12 shot today.  Start vitamin D 2000 units daily Start vitamin B12 daily Good to see you today, call us with questions.  Health Maintenance, Female Adopting a healthy lifestyle and getting preventive care can go a long way to promote health and wellness. Talk with your health care provider about what schedule of regular examinations is right for you. This is a good chance for you to check in with your provider about disease prevention and staying healthy. In between checkups, there are plenty of things you can do on your own. Experts have done a lot of research about which lifestyle changes and preventive measures are most likely to keep you healthy. Ask your health care provider for more information. Weight and diet Eat a healthy diet  Be sure to include plenty of vegetables, fruits, low-fat dairy products, and lean protein.  Do not eat a lot of foods high in solid fats, added sugars, or salt.  Get regular exercise. This is one of the most important things you can do for your health. ? Most adults should exercise for at least 150 minutes each week. The exercise should increase your heart rate and make you sweat (moderate-intensity exercise). ? Most adults should also do strengthening exercises at least twice a week. This is in addition to the moderate-intensity exercise.  Maintain a healthy weight  Body mass index (BMI) is a measurement that can be used to identify possible weight problems. It estimates body fat based on height and weight. Your health care provider can help determine your BMI and help you achieve or maintain a healthy weight.  For females 85 years of age and older: ? A BMI below 18.5 is considered underweight. ? A BMI of 18.5 to 24.9 is normal. ? A BMI of 25 to 29.9 is considered overweight. ? A BMI of 30 and above is considered obese.  Watch levels of cholesterol and blood  lipids  You should start having your blood tested for lipids and cholesterol at 48 years of age, then have this test every 5 years.  You may need to have your cholesterol levels checked more often if: ? Your lipid or cholesterol levels are high. ? You are older than 48 years of age. ? You are at high risk for heart disease.  Cancer screening Lung Cancer  Lung cancer screening is recommended for adults 69-86 years old who are at high risk for lung cancer because of a history of smoking.  A yearly low-dose CT scan of the lungs is recommended for people who: ? Currently smoke. ? Have quit within the past 15 years. ? Have at least a 30-pack-year history of smoking. A pack year is smoking an average of one pack of cigarettes a day for 1 year.  Yearly screening should continue until it has been 15 years since you quit.  Yearly screening should stop if you develop a health problem that would prevent you from having lung cancer treatment.  Breast Cancer  Practice breast self-awareness. This means understanding how your breasts normally appear and feel.  It also means doing regular breast self-exams. Let your health care provider know about any changes, no matter how small.  If you are in your 20s or 30s, you should have a clinical breast exam (CBE) by a health care provider every 1-3 years as part of a regular health exam.  If you are 40 or older,  have a CBE every year. Also consider having a breast X-ray (mammogram) every year.  If you have a family history of breast cancer, talk to your health care provider about genetic screening.  If you are at high risk for breast cancer, talk to your health care provider about having an MRI and a mammogram every year.  Breast cancer gene (BRCA) assessment is recommended for women who have family members with BRCA-related cancers. BRCA-related cancers include: ? Breast. ? Ovarian. ? Tubal. ? Peritoneal cancers.  Results of the assessment will  determine the need for genetic counseling and BRCA1 and BRCA2 testing.  Cervical Cancer Your health care provider may recommend that you be screened regularly for cancer of the pelvic organs (ovaries, uterus, and vagina). This screening involves a pelvic examination, including checking for microscopic changes to the surface of your cervix (Pap test). You may be encouraged to have this screening done every 3 years, beginning at age 71.  For women ages 60-65, health care providers may recommend pelvic exams and Pap testing every 3 years, or they may recommend the Pap and pelvic exam, combined with testing for human papilloma virus (HPV), every 5 years. Some types of HPV increase your risk of cervical cancer. Testing for HPV may also be done on women of any age with unclear Pap test results.  Other health care providers may not recommend any screening for nonpregnant women who are considered low risk for pelvic cancer and who do not have symptoms. Ask your health care provider if a screening pelvic exam is right for you.  If you have had past treatment for cervical cancer or a condition that could lead to cancer, you need Pap tests and screening for cancer for at least 20 years after your treatment. If Pap tests have been discontinued, your risk factors (such as having a new sexual partner) need to be reassessed to determine if screening should resume. Some women have medical problems that increase the chance of getting cervical cancer. In these cases, your health care provider may recommend more frequent screening and Pap tests.  Colorectal Cancer  This type of cancer can be detected and often prevented.  Routine colorectal cancer screening usually begins at 48 years of age and continues through 48 years of age.  Your health care provider may recommend screening at an earlier age if you have risk factors for colon cancer.  Your health care provider may also recommend using home test kits to check  for hidden blood in the stool.  A small camera at the end of a tube can be used to examine your colon directly (sigmoidoscopy or colonoscopy). This is done to check for the earliest forms of colorectal cancer.  Routine screening usually begins at age 15.  Direct examination of the colon should be repeated every 5-10 years through 48 years of age. However, you may need to be screened more often if early forms of precancerous polyps or small growths are found.  Skin Cancer  Check your skin from head to toe regularly.  Tell your health care provider about any new moles or changes in moles, especially if there is a change in a mole's shape or color.  Also tell your health care provider if you have a mole that is larger than the size of a pencil eraser.  Always use sunscreen. Apply sunscreen liberally and repeatedly throughout the day.  Protect yourself by wearing long sleeves, pants, a wide-brimmed hat, and sunglasses whenever you are outside.  Heart disease, diabetes, and high blood pressure  High blood pressure causes heart disease and increases the risk of stroke. High blood pressure is more likely to develop in: ? People who have blood pressure in the high end of the normal range (130-139/85-89 mm Hg). ? People who are overweight or obese. ? People who are African American.  If you are 3-21 years of age, have your blood pressure checked every 3-5 years. If you are 28 years of age or older, have your blood pressure checked every year. You should have your blood pressure measured twice-once when you are at a hospital or clinic, and once when you are not at a hospital or clinic. Record the average of the two measurements. To check your blood pressure when you are not at a hospital or clinic, you can use: ? An automated blood pressure machine at a pharmacy. ? A home blood pressure monitor.  If you are between 62 years and 77 years old, ask your health care provider if you should take  aspirin to prevent strokes.  Have regular diabetes screenings. This involves taking a blood sample to check your fasting blood sugar level. ? If you are at a normal weight and have a low risk for diabetes, have this test once every three years after 48 years of age. ? If you are overweight and have a high risk for diabetes, consider being tested at a younger age or more often. Preventing infection Hepatitis B  If you have a higher risk for hepatitis B, you should be screened for this virus. You are considered at high risk for hepatitis B if: ? You were born in a country where hepatitis B is common. Ask your health care provider which countries are considered high risk. ? Your parents were born in a high-risk country, and you have not been immunized against hepatitis B (hepatitis B vaccine). ? You have HIV or AIDS. ? You use needles to inject street drugs. ? You live with someone who has hepatitis B. ? You have had sex with someone who has hepatitis B. ? You get hemodialysis treatment. ? You take certain medicines for conditions, including cancer, organ transplantation, and autoimmune conditions.  Hepatitis C  Blood testing is recommended for: ? Everyone born from [birth year] through 1967. ? Anyone with known risk factors for hepatitis C.  Sexually transmitted infections (STIs)  You should be screened for sexually transmitted infections (STIs) including gonorrhea and chlamydia if: ? You are sexually active and are younger than 48 years of age. ? You are older than 48 years of age and your health care provider tells you that you are at risk for this type of infection. ? Your sexual activity has changed since you were last screened and you are at an increased risk for chlamydia or gonorrhea. Ask your health care provider if you are at risk.  If you do not have HIV, but are at risk, it may be recommended that you take a prescription medicine daily to prevent HIV infection. This is called  pre-exposure prophylaxis (PrEP). You are considered at risk if: ? You are sexually active and do not regularly use condoms or know the HIV status of your partner(s). ? You take drugs by injection. ? You are sexually active with a partner who has HIV.  Talk with your health care provider about whether you are at high risk of being infected with HIV. If you choose to begin PrEP, you should first be tested for HIV.  You should then be tested every 3 months for as long as you are taking PrEP. Pregnancy  If you are premenopausal and you may become pregnant, ask your health care provider about preconception counseling.  If you may become pregnant, take 400 to 800 micrograms (mcg) of folic acid every day.  If you want to prevent pregnancy, talk to your health care provider about birth control (contraception). Osteoporosis and menopause  Osteoporosis is a disease in which the bones lose minerals and strength with aging. This can result in serious bone fractures. Your risk for osteoporosis can be identified using a bone density scan.  If you are 65 years of age or older, or if you are at risk for osteoporosis and fractures, ask your health care provider if you should be screened.  Ask your health care provider whether you should take a calcium or vitamin D supplement to lower your risk for osteoporosis.  Menopause may have certain physical symptoms and risks.  Hormone replacement therapy may reduce some of these symptoms and risks. Talk to your health care provider about whether hormone replacement therapy is right for you. Follow these instructions at home:  Schedule regular health, dental, and eye exams.  Stay current with your immunizations.  Do not use any tobacco products including cigarettes, chewing tobacco, or electronic cigarettes.  If you are pregnant, do not drink alcohol.  If you are breastfeeding, limit how much and how often you drink alcohol.  Limit alcohol intake to no more  than 1 drink per day for nonpregnant women. One drink equals 12 ounces of beer, 5 ounces of wine, or 1 ounces of hard liquor.  Do not use street drugs.  Do not share needles.  Ask your health care provider for help if you need support or information about quitting drugs.  Tell your health care provider if you often feel depressed.  Tell your health care provider if you have ever been abused or do not feel safe at home. This information is not intended to replace advice given to you by your health care provider. Make sure you discuss any questions you have with your health care provider. Document Released: 01/15/2011 Document Revised: 12/08/2015 Document Reviewed: 04/05/2015 Elsevier Interactive Patient Education  2018 [employer]  

## 2016-12-21 NOTE — Assessment & Plan Note (Addendum)
Preventative protocols reviewed and updated unless pt declined. Discussed healthy diet and lifestyle.  No fmhx colon cancer.

## 2016-12-21 NOTE — Assessment & Plan Note (Addendum)
Increase vit D to 2000 iu daily.  °

## 2016-12-21 NOTE — Assessment & Plan Note (Signed)
Stable period on lexapro - continue. 

## 2016-12-21 NOTE — Assessment & Plan Note (Signed)
This has improved with new job.

## 2016-12-21 NOTE — Progress Notes (Signed)
BP 136/90   Pulse 87   Temp 97.8 F (36.6 C)   Ht 5' 6"  (1.676 m)   Wt 256 lb (116.1 kg)   SpO2 96%   BMI 41.32 kg/m    CC: CPE Subjective:    Patient ID: Theresa Pratt, female    DOB: 1971/06/21, 46 y.o.   MRN: [id number]  HPI: Theresa Pratt is a 46 y.o. female presenting on 12/21/2016 for Annual Exam   3d h/o ST treating with gargling, listerine and children's motrin. Feels worse today - increased fatigue. Today also noted hoarse voice. Denies fevers or swollen glands. No strep exposure. Son did have cold recently.   Chronic urticaria - followed by [facility] allergist. On high dose zyrtec, cyclosporine, singulair, PRN prednisone. Also takes PRN hydroxyzine and zantac.   Preventative: Well woman - [facility] Dr Ernestina Patches 06/2016  Mammogram 06/2015 - due for f/u. She did have negative BRCA gene test for fmhx  Flu shot yearly Tetanus shot - 02/2009 Seat belt use discussed Sunscreen use discussed. No changing moles on skin. Non smoker  Alcohol - wine rarely   Lives with parents, husband, 2 children and small dog  Occupation: new job at [employer], [place] off Activity: did join gym but no regular exercise at this time  Diet: good water, fruits/vegetables daily   Relevant past medical, surgical, family and social history reviewed and updated as indicated. Interim medical history since our last visit reviewed. Allergies and medications reviewed and updated. Outpatient Medications Prior to Visit  Medication Sig Dispense Refill  . cholecalciferol (VITAMIN D) 1000 UNITS tablet Take 2,000 Units by mouth daily.    . hydrOXYzine (ATARAX/VISTARIL) 25 MG tablet TAKE 1 TABLET (25 MG TOTAL) BY MOUTH 3 (THREE) TIMES DAILY AS NEEDED. 90 tablet 0  . Iron Combinations TABS Take 1 tablet by mouth daily.    . Multiple Vitamin (MULTIVITAMIN) tablet Take 1 tablet by mouth daily.      . ranitidine (ZANTAC) 150 MG tablet Take 150 mg by mouth 2 (two) times daily. Reported on 12/08/2015      . escitalopram (LEXAPRO) 5 MG tablet TAKE 1 TABLET (5 MG TOTAL) BY MOUTH DAILY. 30 tablet 3  . hydrochlorothiazide (HYDRODIURIL) 12.5 MG tablet Take 1 tablet (12.5 mg total) by mouth daily. 30 tablet 2  . atomoxetine (STRATTERA) 40 MG capsule Take 1 capsule (40 mg total) by mouth daily. 30 capsule 3  . cyclobenzaprine (FLEXERIL) 5 MG tablet Take 1 tablet (5 mg total) by mouth 3 (three) times daily as needed for muscle spasms. 30 tablet 0  . meloxicam (MOBIC) 15 MG tablet Take 1 tablet (15 mg total) by mouth daily as needed for pain. 15 tablet 0   No facility-administered medications prior to visit.      Per HPI unless specifically indicated in ROS section below Review of Systems  Constitutional: Negative for activity change, appetite change, chills, fatigue, fever and unexpected weight change.  HENT: Positive for sore throat. Negative for hearing loss.   Eyes: Negative for visual disturbance.  Respiratory: Negative for cough, chest tightness, shortness of breath and wheezing.   Cardiovascular: Positive for palpitations (occasional flutter at night, stress related, lasts 10 seconds). Negative for chest pain and leg swelling.  Gastrointestinal: Positive for diarrhea (looser stools (?IBS)). Negative for abdominal distention, abdominal pain, blood in stool, constipation, nausea and vomiting.  Genitourinary: Negative for difficulty urinating and hematuria.  Musculoskeletal: Negative for arthralgias, myalgias and neck pain.  Skin: Negative for  rash.  Neurological: Negative for dizziness, seizures, syncope and headaches.  Hematological: Negative for adenopathy. Does not bruise/bleed easily.  Psychiatric/Behavioral: Negative for dysphoric mood. The patient is not nervous/anxious.        She feels lexapro has been helpful. Not regularly using strattera        Objective:    BP 136/90   Pulse 87   Temp 97.8 F (36.6 C)   Ht 5' 6"  (1.676 m)   Wt 256 lb (116.1 kg)   SpO2 96%   BMI 41.32  kg/m   Wt Readings from Last 3 Encounters:  12/21/16 256 lb (116.1 kg)  12/07/16 252 lb (114.3 kg)  07/13/16 256 lb (116.1 kg)    Physical Exam  Constitutional: She is oriented to person, place, and time. She appears well-developed and well-nourished. No distress.  HENT:  Head: Normocephalic and atraumatic.  Right Ear: Hearing, tympanic membrane, external ear and ear canal normal.  Left Ear: Hearing, tympanic membrane, external ear and ear canal normal.  Nose: Nose normal.  Mouth/Throat: Uvula is midline and mucous membranes are normal. Posterior oropharyngeal erythema (mild) present. No oropharyngeal exudate or posterior oropharyngeal edema.  Exudates on tonsils  Eyes: Conjunctivae and EOM are normal. Pupils are equal, round, and reactive to light. No scleral icterus.  Neck: Normal range of motion. Neck supple. No thyromegaly present.  Cardiovascular: Normal rate, regular rhythm, normal heart sounds and intact distal pulses.   No murmur heard. Pulses:      Radial pulses are 2+ on the right side, and 2+ on the left side.  Pulmonary/Chest: Effort normal and breath sounds normal. No respiratory distress. She has no wheezes. She has no rales.  Abdominal: Soft. Bowel sounds are normal. She exhibits no distension and no mass. There is no tenderness. There is no rebound and no guarding.  Musculoskeletal: Normal range of motion. She exhibits no edema.  Lymphadenopathy:    She has no cervical adenopathy.  Neurological: She is alert and oriented to person, place, and time.  CN grossly intact, station and gait intact  Skin: Skin is warm and dry. No rash noted.  Psychiatric: She has a normal mood and affect. Her behavior is normal. Judgment and thought content normal.  Nursing note and vitals reviewed.  Results for orders placed or performed in visit on 12/21/16  POCT rapid strep A  Result Value Ref Range   Rapid Strep A Screen Negative Negative      Assessment & Plan:   Problem List  Items Addressed This Visit    Adjustment disorder with anxiety    Stable period on lexapro - continue.       Relevant Medications   escitalopram (LEXAPRO) 5 MG tablet   Bariatric surgery status    Reviewed vitamin deficiencies.       BREAST MASS, LEFT    Overdue for f/u mammogram - she will call and schedule.       Chronic urticaria    Appreciate [facility] allergist evaluation and management. Stable period with decreased stress with new job. I suggested she f/u with allergy to discuss simplified regimen and if deemed appropriate by allergy, we could continue to follow here. She finds singulair has been most effective medication.       Essential hypertension    Chronic, stable. Continue hctz 12.34m daily.      Relevant Medications   hydrochlorothiazide (HYDRODIURIL) 12.5 MG tablet   Healthcare maintenance - Primary    Preventative protocols reviewed and updated unless  pt declined. Discussed healthy diet and lifestyle.  No fmhx colon cancer.      Inattention    strattera refilled. She has met criteria for adult ADHD. Previously was referred for formal testing, unclear if completed.      Iron deficiency anemia    No longer an issue. Unable to resolve.       Relevant Medications   cyanocobalamin ((VITAMIN B-12)) injection 1,000 mcg (Completed)   vitamin B-12 (CYANOCOBALAMIN) 1000 MCG tablet   Iron malabsorption    Iron levels ok, stores remain low. Suggested she start ferrous sulfate daily.       Sore throat    Describes ST over last few days, exam with tonsillar exudates - RST negative. Anticipate viral larygnopharyngitis and reviewed supportive care for treatment.       Vitamin B12 deficiency    B12 shot today. rec start b12 oral 1029mg daily      Vitamin D deficiency    Increase vit D to 2000 iu daily.       Relevant Medications   cyanocobalamin ((VITAMIN B-12)) injection 1,000 mcg (Completed)   Work-related stress    This has improved with new job.            Follow up plan: Return in about 1 year (around 12/21/2017) for annual exam, prior fasting for blood work.  JRia Bush MD

## 2016-12-21 NOTE — Assessment & Plan Note (Signed)
B12 shot today. rec start b12 oral 1049mcg daily

## 2016-12-21 NOTE — Assessment & Plan Note (Signed)
Overdue for f/u mammogram - she will call and schedule.

## 2016-12-21 NOTE — Assessment & Plan Note (Signed)
Iron levels ok, stores remain low. Suggested she start ferrous sulfate daily.

## 2016-12-21 NOTE — Assessment & Plan Note (Signed)
No longer an issue. Unable to resolve.

## 2016-12-21 NOTE — Assessment & Plan Note (Signed)
Reviewed vitamin deficiencies.

## 2016-12-21 NOTE — Assessment & Plan Note (Signed)
Describes ST over last few days, exam with tonsillar exudates - RST negative. Anticipate viral larygnopharyngitis and reviewed supportive care for treatment.

## 2016-12-21 NOTE — Assessment & Plan Note (Signed)
Chronic, stable. Continue hctz 12.5mg daily.  

## 2017-02-13 ENCOUNTER — Emergency Department (HOSPITAL_COMMUNITY)
Admission: EM | Admit: 2017-02-13 | Discharge: 2017-02-14 | Disposition: A | Discharge status: Home / Self Care | Payer: BC Managed Care – PPO | Attending: Emergency Medicine | Admitting: Emergency Medicine

## 2017-02-13 ENCOUNTER — Emergency Department (HOSPITAL_COMMUNITY): Payer: BC Managed Care – PPO

## 2017-02-13 ENCOUNTER — Encounter (HOSPITAL_COMMUNITY): Payer: Self-pay | Admitting: *Deleted

## 2017-02-13 DIAGNOSIS — Y998 Other external cause status: Secondary | ICD-10-CM | POA: Insufficient documentation

## 2017-02-13 DIAGNOSIS — E119 Type 2 diabetes mellitus without complications: Secondary | ICD-10-CM | POA: Diagnosis not present

## 2017-02-13 DIAGNOSIS — W19XXXA Unspecified fall, initial encounter: Secondary | ICD-10-CM

## 2017-02-13 DIAGNOSIS — W01198A Fall on same level from slipping, tripping and stumbling with subsequent striking against other object, initial encounter: Secondary | ICD-10-CM | POA: Insufficient documentation

## 2017-02-13 DIAGNOSIS — S82832A Other fracture of upper and lower end of left fibula, initial encounter for closed fracture: Secondary | ICD-10-CM

## 2017-02-13 DIAGNOSIS — Y9389 Activity, other specified: Secondary | ICD-10-CM | POA: Insufficient documentation

## 2017-02-13 DIAGNOSIS — I10 Essential (primary) hypertension: Secondary | ICD-10-CM | POA: Diagnosis not present

## 2017-02-13 DIAGNOSIS — Z79899 Other long term (current) drug therapy: Secondary | ICD-10-CM | POA: Diagnosis not present

## 2017-02-13 DIAGNOSIS — Y92015 Private garage of single-family (private) house as the place of occurrence of the external cause: Secondary | ICD-10-CM | POA: Insufficient documentation

## 2017-02-13 DIAGNOSIS — S8992XA Unspecified injury of left lower leg, initial encounter: Secondary | ICD-10-CM | POA: Diagnosis present

## 2017-02-13 HISTORY — PX: ORIF ANKLE FRACTURE: SUR919

## 2017-02-13 MED ORDER — IBUPROFEN 400 MG PO TABS
ORAL_TABLET | ORAL | Status: DC
Start: 2017-02-13 — End: 2017-02-14
  Filled 2017-02-13: qty 1

## 2017-02-13 MED ORDER — IBUPROFEN 400 MG PO TABS
400.0000 mg | ORAL_TABLET | Freq: Once | ORAL | Status: AC | PRN
  Administered 2017-02-13: 400 mg via ORAL

## 2017-02-13 MED ORDER — OXYCODONE HCL 5 MG PO TABS: 5.0000 mg | ORAL_TABLET | ORAL | 0 refills | Status: DC | PRN

## 2017-02-13 MED ORDER — OXYCODONE-ACETAMINOPHEN 5-325 MG PO TABS
1.0000 | ORAL_TABLET | Freq: Once | ORAL | Status: AC
  Administered 2017-02-13: 1 via ORAL
  Filled 2017-02-13 (×2): qty 1

## 2017-02-13 MED ORDER — OXYCODONE-ACETAMINOPHEN 5-325 MG PO TABS: 1.0000 | ORAL_TABLET | ORAL | 0 refills | Status: DC | PRN

## 2017-02-13 NOTE — ED Provider Notes (Signed)
[facility] DEPT Provider Note   CSN: [id number] Arrival date & time: 02/13/17  2142     History   Chief Complaint Chief Complaint  Patient presents with  . Fall    HPI Theresa Pratt is a 46 y.o. female.  The history is provided by the patient and medical records.  Fall     46 year old female with history of seasonal allergies, GERD, iron deficiency anemia, history of gastric bypass, presenting to the ED after a fall. Patient states she had a new dog and was going to the garage to go play with him. States she was going down the stairs and looked away and missed a step causing her to fall into a rabbit cage. States she twisted her left ankle and her foot bent awkwardly beneath her. States she did hit her head on a nearby water heater but denies loss of consciousness. She has a small abrasion to the right upper arm. States she was able to stand up and noticed that her left lower leg felt "weird" but she was able to walk on it. States she had her mother wrap and Ace wrap around her ankle but had increasing pain so she decided to get it evaluated. She denies any current headache, dizziness, confusion, numbness, or weakness. No prior ankle injuries in the past. She is not currently on anticoagulation.  Past Medical History:  Diagnosis Date  . Allergic rhinitis    post gastric bypass  . Arthropathy    weight bearing (pre gastric bypass)  . DM (diabetes mellitus) ([place])    pre gastric bypass  . GERD (gastroesophageal reflux disease)    post gastric bypass  . HTN (hypertension)    Pre gastric bypass  . HTN (hypertension)    post gastric bypass  . IDA (iron deficiency anemia)    post gastric bypass (iron infusion by hematology-doesn't build stores -Dr. Humphrey Rolls)  . Morbid obesity ([place])    Pre gastric bypass  . 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 Oligomenorrhea    on Implanon (pre gastric bypass)  . Postpartum depression    post gastric bypass  . Pre-eclampsia    post  gastric bypass    Patient Active Problem List   Diagnosis Date Noted  . Sore throat 12/21/2016  . Vitamin B12 deficiency 12/21/2016  . Right foot pain 12/07/2016  . Inattention 05/29/2016  . Chronic urticaria 03/05/2016  . Adjustment disorder with anxiety 02/28/2016  . Anxiety 06/29/2015  . 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 Work-related stress 04/28/2014  . Menorrhagia 07/04/2012  . 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 Healthcare maintenance 06/17/2012  . BREAST MASS, LEFT 07/21/2010  . BACK PAIN, LUMBAR 07/14/2010  . Vitamin D deficiency 07/03/2010  . ALLERGIC RHINITIS 07/03/2010  . GERD 07/03/2010  . Bariatric surgery status 07/03/2010  . Essential hypertension 06/22/2010    Past Surgical History:  Procedure Laterality Date  . CHOLECYSTECTOMY  08/2010  . 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 implanon placed  03/16/09   (3 years)  . rectal condyloma removal  2006    OB History    Gravida Para Term Preterm AB Living   2 2 2     2    SAB TAB Ectopic Multiple Live Births                   Home Medications    Prior to Admission medications   Medication Sig Start Date End Date Taking? Authorizing Provider  atomoxetine (STRATTERA)  40 MG capsule Take 1 capsule (40 mg total) by mouth daily. 12/21/16   [name], MD  Cetirizine HCl (ZYRTEC ALLERGY PO) Take by mouth.    [provider]  cholecalciferol (VITAMIN D) 1000 UNITS tablet Take 2,000 Units by mouth daily.    [provider]  CYCLOSPORINE PO Take by mouth as needed.    [provider]  escitalopram (LEXAPRO) 5 MG tablet Take 1 tablet (5 mg total) by mouth daily. 12/21/16   [name], MD  hydrochlorothiazide (HYDRODIURIL) 12.5 MG tablet Take 1 tablet (12.5 mg total) by mouth daily. 12/21/16   [name], MD  hydrOXYzine (ATARAX/VISTARIL) 25 MG tablet TAKE 1 TABLET (25 MG TOTAL) BY MOUTH 3 (THREE) TIMES DAILY AS NEEDED. 04/10/16   Venia Carbon, MD  Iron  Combinations TABS Take 1 tablet by mouth daily.    [provider]  Montelukast Sodium (SINGULAIR PO) Take by mouth.    [provider]  Multiple Vitamin (MULTIVITAMIN) tablet Take 1 tablet by mouth daily.      [provider]  predniSONE (DELTASONE) 5 MG tablet Take 5 mg by mouth as needed.    [provider]  ranitidine (ZANTAC) 150 MG tablet Take 150 mg by mouth 2 (two) times daily. Reported on 12/08/2015    [provider]  vitamin B-12 (CYANOCOBALAMIN) 1000 MCG tablet Take 1 tablet (1,000 mcg total) by mouth daily. 12/21/16   [name], MD    Family History Family History  Problem Relation Age of Onset  . Coronary artery disease Father        2 stents; + smoker  . Hypertension Father   . Thyroid disease Brother   . Diabetes Maternal Grandmother   . Cirrhosis Paternal Grandmother   . Alcohol abuse Paternal Grandmother   . Breast cancer Unknown        Paternal great aunt(obesity, smoker, EtOHic)  . Breast cancer Unknown        Maternal great aunt (deceased in 49's from same)  . Thyroid disease Other        cousins and grandmother  . Cancer Other        great aunt - lung  . Liver disease Paternal Aunt   . Cancer Cousin        liver, bile duct  . Cancer Mother        uterine    Social History Social History  Substance Use Topics  . Smoking status: Never Smoker  . Smokeless tobacco: Never Used  . Alcohol use Yes     Comment: occasional     Allergies   Sertraline   Review of Systems Review of Systems  Musculoskeletal: Positive for arthralgias.  All other systems reviewed and are negative.    Physical Exam Updated Vital Signs BP (!) 157/109 (BP Location: Left Arm)   Pulse 100   Temp 97.7 F (36.5 C) (Oral)   Resp (!) 28   Ht 5\' 6"  (1.676 m)   Wt 116.1 kg (256 lb)   LMP 02/04/2017   SpO2 98%   BMI 41.32 kg/m   Physical Exam  Constitutional: She is oriented to person, place, and time. She appears  well-developed and well-nourished.  HENT:  Head: Normocephalic and atraumatic.  Mouth/Throat: Oropharynx is clear and moist.  No visible signs of head trauma  Eyes: Pupils are equal, round, and reactive to light. Conjunctivae and EOM are normal.  Neck: Normal range of motion.  Cardiovascular: Normal rate, regular rhythm  and normal heart sounds.   Pulmonary/Chest: Effort normal and breath sounds normal.  Abdominal: Soft. Bowel sounds are normal.  Musculoskeletal: Normal range of motion.  Left lower leg with swelling/bruising over the distal lower leg; bruising more concentrated over the distal fibula; swelling noted; compartments are soft and easily compressible; DP pulse intact; normal sensation throughout; able to bear weight on affected ankle and transfer from wheelchair to stretcher; no tenderness over proximal lower leg Abrasion of right upper arm, no bleeding  Neurological: She is alert and oriented to person, place, and time.  AAOx3, answering questions and following commands appropriately; equal strength UE and LE bilaterally; CN grossly intact; moves all extremities appropriately without ataxia; no focal neuro deficits or facial asymmetry appreciated  Skin: Skin is warm and dry.  Psychiatric: She has a normal mood and affect.  Nursing note and vitals reviewed.    ED Treatments / Results  Labs (all labs ordered are listed, but only abnormal results are displayed) Labs Reviewed - No data to display  EKG  EKG Interpretation None       Radiology Dg Tibia/fibula Left  Result Date: 02/13/2017 CLINICAL DATA:  Initial evaluation for acute trauma, fall. EXAM: LEFT TIBIA AND FIBULA - 2 VIEW COMPARISON:  None. FINDINGS: Acute comminuted fracture of the distal fibula with slight posterior displacement. No other acute fracture dislocation. Tibia intact. Mild degenerative osteoarthritic changes noted about the knee and ankle. Mild soft tissue swelling noted about the distal tibia/ fibula  and ankle. IMPRESSION: 1. Acute comminuted oblique fracture of the distal left fibular shaft with slight posterior displacement. 2. No other acute fracture or dislocation about the tibia and fibula. Electronically Signed   By: Jeannine Boga M.D.   On: 02/13/2017 22:30   Dg Ankle Complete Left  Result Date: 02/13/2017 CLINICAL DATA:  Initial evaluation for acute trauma, fall. EXAM: LEFT ANKLE COMPLETE - 3+ VIEW COMPARISON:  None. FINDINGS: Acute comminuted oblique fracture of the distal left fibular shaft with slight posterior displacement. Fracture extends through the syndesmosis without intra-articular extension. Ankle mortise approximated. Distal tibia intact. Mild soft tissue swelling about the ankle. IMPRESSION: Acute comminuted oblique fracture of the distal left fibula with slight posterior displacement. Electronically Signed   By: Jeannine Boga M.D.   On: 02/13/2017 22:31    Procedures Procedures (including critical care time)  Medications Ordered in ED Medications  ibuprofen (ADVIL,MOTRIN) 400 MG tablet (not administered)  oxyCODONE-acetaminophen (PERCOCET/ROXICET) 5-325 MG per tablet 1 tablet (not administered)  ibuprofen (ADVIL,MOTRIN) tablet 400 mg (400 mg Oral Given 02/13/17 2156)     Initial Impression / Assessment and Plan / ED Course  I have reviewed the triage vital signs and the nursing notes.  Pertinent labs & imaging results that were available during my care of the patient were reviewed by me and considered in my medical decision making (see chart for details).  46 year old female here after mechanical fall on the stairs. She fell into a rabbit cage. She did hit her head but no loss of consciousness. Here she is awake, alert, appropriately oriented. Neurologic exam is nonfocal. She is not currently on anticoagulation. She has a small abrasion to right upper arm but no active bleeding. She has swelling of the left ankle and some bruising that has developed over  the distal fibula. X-rays obtained from triage revealing comminuted, oblique fracture of the distal left fibula, minor posterior displacement.  No associated proximal tibia injuries and no tenderness on exam.  Leg is NVI on  exam, compartments soft and easily compressible.  Will place in short leg splint with crutches, orthopedic follow-up.  Rx oxycodone, can alternate tylenol and motrin as well.  Patient understands to return here for any new/worsening symptoms, comfortable with her care plan.  Patient discharged home in stable condition.  Final Clinical Impressions(s) / ED Diagnoses   Final diagnoses:  Fall, initial encounter  Other closed fracture of distal end of left fibula, initial encounter    New Prescriptions New Prescriptions   OXYCODONE (OXY IR/ROXICODONE) 5 MG IMMEDIATE RELEASE TABLET    Take 1 tablet (5 mg total) by mouth every 4 (four) hours as needed for severe pain.     Larene Pickett, PA-C 02/13/17 2357    Forde Dandy, MD 02/15/17 1224

## 2017-02-13 NOTE — ED Notes (Signed)
Ortho tech paged  

## 2017-02-13 NOTE — ED Triage Notes (Signed)
Pt slipped and fell into a rabbit cage, abrasion noted to R upper arm. Reports left ankle rotated inward during fall, significant swelling noted to L lower leg and ankle. Also reports hitting her head, denies LOC

## 2017-02-13 NOTE — Discharge Instructions (Signed)
Take the prescribed medication as directed.  Can alternate tylenol and motrin every 4-6 hours as well. Follow-up with orthopedics-- call to make an appt in the morning. Return to the ED for new or worsening symptoms.

## 2017-02-13 NOTE — ED Notes (Signed)
Ortho tech at bedside 

## 2017-02-14 DIAGNOSIS — S82832A Other fracture of upper and lower end of left fibula, initial encounter for closed fracture: Secondary | ICD-10-CM | POA: Diagnosis not present

## 2017-02-14 NOTE — Progress Notes (Signed)
Orthopedic Tech Progress Note Patient Details:  Theresa Pratt 1970-10-14 [id number]  Ortho Devices Type of Ortho Device: Short leg splint, Crutches Ortho Device/Splint Location: Left lower leg Ortho Device/Splint Interventions: Application, Adjustment   Kristopher Oppenheim 02/14/2017, 12:32 AM

## 2017-02-14 NOTE — ED Notes (Signed)
Signature pad unavailable, pt verbalized understanding of discharge instructions and prescriptions.  No questions at this time, pt given note for work.

## 2017-02-19 ENCOUNTER — Encounter (HOSPITAL_COMMUNITY): Payer: Self-pay | Admitting: *Deleted

## 2017-02-19 ENCOUNTER — Ambulatory Visit (INDEPENDENT_AMBULATORY_CARE_PROVIDER_SITE_OTHER): Payer: BC Managed Care – PPO | Admitting: Physician Assistant

## 2017-02-19 DIAGNOSIS — S8262XA Displaced fracture of lateral malleolus of left fibula, initial encounter for closed fracture: Secondary | ICD-10-CM

## 2017-02-19 MED ORDER — OXYCODONE HCL 5 MG PO TABS: 5.0000 mg | ORAL_TABLET | ORAL | 0 refills | Status: DC | PRN

## 2017-02-19 NOTE — Progress Notes (Signed)
Spoke with pt for pre-op call. Pt denies cardiac history, chest pain or sob. Pt states she is no longer diabetic after having gastric bypass surgery in 2003. States Dr. Danise Mina doesn't even discuss it with her anymore. Cannot find an A1C result in EPIC.

## 2017-02-19 NOTE — Progress Notes (Signed)
Office Visit Note   Patient: Theresa Pratt           Date of Birth: 02/16/1971           MRN: [id number] Visit Date: 02/19/2017              Requested by: [name], MD [address] [place],  [address] PCP: [name], MD   Assessment & Plan: Visit Diagnoses:  1. Displaced fracture of lateral malleolus of left fibula, initial encounter for closed fracture     Plan: She is placed in a cam walker boot. She is nonweightbearing. She is scheduled for surgery with Dr. 2 is Thursday. She would like to be overnight observation disc for pain control. Risks benefits discussed with patient today at length by Dr. Erlinda Hong and  myself. Postoperative protocol discussed with patient. Encouraged wiggling toes and elevation of left ankle to help with swelling.  Follow-Up Instructions: Return in about 2 weeks (around 03/05/2017) for Postop.   Orders:  No orders of the defined types were placed in this encounter.  Meds ordered this encounter  Medications  . oxyCODONE (OXY IR/ROXICODONE) 5 MG immediate release tablet    Sig: Take 1 tablet (5 mg total) by mouth every 4 (four) hours as needed for severe pain.    Dispense:  60 tablet    Refill:  0      Procedures: No procedures performed   Clinical Data: No additional findings.   Subjective: Left ankle fracture  HPI Theresa Pratt is a 46 year old female who received the first time for left fibula fracture. She states she was running down steps and fell , hitting her head on a water heater no loss of consciousness. She also ended up landing on her rapid cage which cause several puncture marks in her right upper arm. She states she is up-to-date on her tetanus. She also sustained a left ankle injury. The injury occurred on 02/13/2017 She was seen in the ER and found to have a lateral malleolus fracture. She placed in a posterior splint with a stirrup. She notes at the time of the injury at that she turned her ankle  inward. She states that she reduce the ankle herself before going to the ER. She's been placing some weight on the ankle since being seen in the ER mainly for balance. She denies history of cardiac disease. She has chronic hives and is on prednisone and other medications for this. 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 No history of DVT personally or family. Personally reviewed the left tibia 2 views and 3 views of the left ankle. They show a comminuted oblique fracture of the distal left fibular shaft with slight posterior displacement. Talus is well located within the knee and the ankle mortise without diastases. No evidence of Mason a fracture of the proximal fibula.  Review of Systems No chest pain shortness breath fevers chills nausea or vomiting.  Objective: Vital Signs: LMP 02/04/2017   Physical Exam  Constitutional: She is oriented to person, place, and time. She appears well-developed and well-nourished. No distress.  Cardiovascular: Intact distal pulses.   Neurological: She is alert and oriented to person, place, and time.  Skin: She is not diaphoretic.  Psychiatric: She has a normal mood and affect.    Ortho Exam Left ankle she has tenderness over the lateral malleolus. No tenderness over the Achilles deltoid ligaments or medial malleolus. Remaining foot is nontender. She has nonpitting edema left lower leg and ankle.  Calf supple nontender. No rashes skin lesions ulcerations erythema or impending ulcers of the left foot and ankle Specialty Comments:  No specialty comments available.  Imaging: No results found.   PMFS History: Patient Active Problem List   Diagnosis Date Noted  . Displaced fracture of lateral malleolus of left fibula, initial encounter for closed fracture 02/19/2017  . Sore throat 12/21/2016  . Vitamin B12 deficiency 12/21/2016  . Right foot pain 12/07/2016  . Inattention 05/29/2016  . Chronic urticaria 03/05/2016  . Adjustment disorder with anxiety 02/28/2016  .  Anxiety 06/29/2015  . 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 Work-related stress 04/28/2014  . Menorrhagia 07/04/2012  . 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 Healthcare maintenance 06/17/2012  . BREAST MASS, LEFT 07/21/2010  . BACK PAIN, LUMBAR 07/14/2010  . Vitamin D deficiency 07/03/2010  . ALLERGIC RHINITIS 07/03/2010  . GERD 07/03/2010  . Bariatric surgery status 07/03/2010  . Essential hypertension 06/22/2010   Past Medical History:  Diagnosis Date  . Allergic rhinitis    post gastric bypass  . Arthropathy    weight bearing (pre gastric bypass)  . DM (diabetes mellitus) ([place])    pre gastric bypass  . GERD (gastroesophageal reflux disease)    post gastric bypass  . HTN (hypertension)    Pre gastric bypass  . HTN (hypertension)    post gastric bypass  . IDA (iron deficiency anemia)    post gastric bypass (iron infusion by hematology-doesn't build stores -Dr. Humphrey Rolls)  . Morbid obesity ([place])    Pre gastric bypass  . 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 Oligomenorrhea    on Implanon (pre gastric bypass)  . Postpartum depression    post gastric bypass  . Pre-eclampsia    post gastric bypass    Family History  Problem Relation Age of Onset  . Coronary artery disease Father        2 stents; + smoker  . Hypertension Father   . Thyroid disease Brother   . Diabetes Maternal Grandmother   . Cirrhosis Paternal Grandmother   . Alcohol abuse Paternal Grandmother   . Breast cancer Unknown        Paternal great aunt(obesity, smoker, EtOHic)  . Breast cancer Unknown        Maternal great aunt (deceased in 47's from same)  . Thyroid disease Other        cousins and grandmother  . Cancer Other        great aunt - lung  . Liver disease Paternal Aunt   . Cancer Cousin        liver, bile duct  . Cancer Mother        uterine    Past Surgical History:  Procedure Laterality Date  . CHOLECYSTECTOMY  08/2010  . 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 implanon placed   03/16/09   (3 years)  . rectal condyloma removal  2006   Social History   Occupational History  . [employer] Cosmetology Dept Head    Social History Main Topics  . Smoking status: Never Smoker  . Smokeless tobacco: Never Used  . Alcohol use Yes     Comment: occasional  . Drug use: No  . Sexual activity: Yes    Partners: Male    Birth control/ protection: None

## 2017-02-21 ENCOUNTER — Ambulatory Visit (HOSPITAL_COMMUNITY): Payer: BC Managed Care – PPO | Admitting: Certified Registered Nurse Anesthetist

## 2017-02-21 ENCOUNTER — Ambulatory Visit (HOSPITAL_COMMUNITY): Payer: BC Managed Care – PPO

## 2017-02-21 ENCOUNTER — Encounter (HOSPITAL_COMMUNITY): Payer: Self-pay | Admitting: Urology

## 2017-02-21 ENCOUNTER — Other Ambulatory Visit: Payer: Self-pay

## 2017-02-21 ENCOUNTER — Observation Stay (HOSPITAL_COMMUNITY)
Admission: RE | Admit: 2017-02-21 | Discharge: 2017-02-22 | Disposition: A | Discharge status: Home / Self Care | Payer: BC Managed Care – PPO | Source: Ambulatory Visit | Attending: Orthopaedic Surgery | Admitting: Orthopaedic Surgery

## 2017-02-21 ENCOUNTER — Encounter (HOSPITAL_COMMUNITY)
Admission: RE | Disposition: A | Discharge status: Home / Self Care | Payer: Self-pay | Source: Ambulatory Visit | Attending: Orthopaedic Surgery

## 2017-02-21 ENCOUNTER — Other Ambulatory Visit (INDEPENDENT_AMBULATORY_CARE_PROVIDER_SITE_OTHER): Payer: Self-pay | Admitting: Orthopaedic Surgery

## 2017-02-21 DIAGNOSIS — X58XXXA Exposure to other specified factors, initial encounter: Secondary | ICD-10-CM | POA: Diagnosis not present

## 2017-02-21 DIAGNOSIS — Z888 Allergy status to other drugs, medicaments and biological substances status: Secondary | ICD-10-CM | POA: Diagnosis not present

## 2017-02-21 DIAGNOSIS — I10 Essential (primary) hypertension: Secondary | ICD-10-CM | POA: Diagnosis not present

## 2017-02-21 DIAGNOSIS — Z9884 Bariatric surgery status: Secondary | ICD-10-CM | POA: Diagnosis not present

## 2017-02-21 DIAGNOSIS — Z7982 Long term (current) use of aspirin: Secondary | ICD-10-CM | POA: Diagnosis not present

## 2017-02-21 DIAGNOSIS — Z79899 Other long term (current) drug therapy: Secondary | ICD-10-CM | POA: Diagnosis not present

## 2017-02-21 DIAGNOSIS — F419 Anxiety disorder, unspecified: Secondary | ICD-10-CM | POA: Insufficient documentation

## 2017-02-21 DIAGNOSIS — K219 Gastro-esophageal reflux disease without esophagitis: Secondary | ICD-10-CM | POA: Diagnosis not present

## 2017-02-21 DIAGNOSIS — Z6841 Body Mass Index (BMI) 40.0 and over, adult: Secondary | ICD-10-CM | POA: Insufficient documentation

## 2017-02-21 DIAGNOSIS — Z9889 Other specified postprocedural states: Secondary | ICD-10-CM | POA: Insufficient documentation

## 2017-02-21 DIAGNOSIS — F909 Attention-deficit hyperactivity disorder, unspecified type: Secondary | ICD-10-CM | POA: Diagnosis not present

## 2017-02-21 DIAGNOSIS — F329 Major depressive disorder, single episode, unspecified: Secondary | ICD-10-CM | POA: Diagnosis not present

## 2017-02-21 DIAGNOSIS — E119 Type 2 diabetes mellitus without complications: Secondary | ICD-10-CM | POA: Diagnosis not present

## 2017-02-21 DIAGNOSIS — Z419 Encounter for procedure for purposes other than remedying health state, unspecified: Secondary | ICD-10-CM

## 2017-02-21 DIAGNOSIS — S8262XA Displaced fracture of lateral malleolus of left fibula, initial encounter for closed fracture: Principal | ICD-10-CM | POA: Insufficient documentation

## 2017-02-21 DIAGNOSIS — G2581 Restless legs syndrome: Secondary | ICD-10-CM | POA: Diagnosis not present

## 2017-02-21 DIAGNOSIS — Z8249 Family history of ischemic heart disease and other diseases of the circulatory system: Secondary | ICD-10-CM | POA: Diagnosis not present

## 2017-02-21 HISTORY — DX: Restless legs syndrome: G25.81

## 2017-02-21 HISTORY — DX: Functional diarrhea: K59.1

## 2017-02-21 HISTORY — DX: Attention-deficit hyperactivity disorder, other type: F90.8

## 2017-02-21 HISTORY — DX: Nausea with vomiting, unspecified: R11.2

## 2017-02-21 HISTORY — PX: ORIF ANKLE FRACTURE: SHX5408

## 2017-02-21 HISTORY — DX: Anxiety disorder, unspecified: F41.9

## 2017-02-21 HISTORY — DX: Other specified postprocedural states: Z98.890

## 2017-02-21 HISTORY — DX: Cardiac murmur, unspecified: R01.1

## 2017-02-21 LAB — BASIC METABOLIC PANEL
ANION GAP: 9 (ref 5–15)
BUN: 12 mg/dL (ref 6–20)
CALCIUM: 9 mg/dL (ref 8.9–10.3)
CO2: 26 mmol/L (ref 22–32)
CREATININE: 0.97 mg/dL (ref 0.44–1.00)
Chloride: 103 mmol/L (ref 101–111)
GFR calc non Af Amer: >60 mL/min (ref >60)
Glucose, Bld: 102 mg/dL — ABNORMAL HIGH (ref 65–99)
Potassium: 3.6 mmol/L (ref 3.5–5.1)
SODIUM: 138 mmol/L (ref 135–145)

## 2017-02-21 LAB — CBC
HCT: 40.1 % (ref 36.0–46.0)
Hemoglobin: 13.2 g/dL (ref 12.0–15.0)
MCH: 27.3 pg (ref 26.0–34.0)
MCHC: 32.9 g/dL (ref 30.0–36.0)
MCV: 82.9 fL (ref 78.0–100.0)
PLATELETS: 427 10*3/uL — AB (ref 150–400)
RBC: 4.84 MIL/uL (ref 3.87–5.11)
RDW: 14.4 % (ref 11.5–15.5)
WBC: 12.4 10*3/uL — ABNORMAL HIGH (ref 4.0–10.5)

## 2017-02-21 LAB — HCG, SERUM, QUALITATIVE: PREG SERUM: NEGATIVE

## 2017-02-21 SURGERY — OPEN REDUCTION INTERNAL FIXATION (ORIF) ANKLE FRACTURE
Anesthesia: General | Site: Ankle | Laterality: Left

## 2017-02-21 MED ORDER — MIDAZOLAM HCL 2 MG/2ML IJ SOLN
1.0000 mg | INTRAMUSCULAR | Status: DC | PRN
  Administered 2017-02-21: 2 mg via INTRAVENOUS

## 2017-02-21 MED ORDER — ONDANSETRON HCL 4 MG/2ML IJ SOLN: 4.0000 mg | Freq: Four times a day (QID) | INTRAMUSCULAR | Status: DC | PRN

## 2017-02-21 MED ORDER — SCOPOLAMINE 1 MG/3DAYS TD PT72
MEDICATED_PATCH | TRANSDERMAL | Status: AC
  Filled 2017-02-21: qty 1

## 2017-02-21 MED ORDER — DEXTROSE 5 % IV SOLN: 500.0000 mg | Freq: Four times a day (QID) | INTRAVENOUS | Status: DC | PRN

## 2017-02-21 MED ORDER — ASPIRIN EC 325 MG PO TBEC
325.0000 mg | DELAYED_RELEASE_TABLET | Freq: Two times a day (BID) | ORAL | Status: DC
  Administered 2017-02-22: 325 mg via ORAL
  Filled 2017-02-21: qty 1

## 2017-02-21 MED ORDER — PROMETHAZINE HCL 25 MG/ML IJ SOLN
6.2500 mg | INTRAMUSCULAR | Status: DC | PRN
  Administered 2017-02-21: 6.25 mg via INTRAVENOUS

## 2017-02-21 MED ORDER — PROPOFOL 10 MG/ML IV BOLUS
INTRAVENOUS | Status: DC | PRN
  Administered 2017-02-21: 180 mg via INTRAVENOUS

## 2017-02-21 MED ORDER — ONDANSETRON HCL 4 MG PO TABS: 4.0000 mg | ORAL_TABLET | Freq: Three times a day (TID) | ORAL | 0 refills | Status: DC | PRN

## 2017-02-21 MED ORDER — ONDANSETRON HCL 4 MG PO TABS: 4.0000 mg | ORAL_TABLET | Freq: Four times a day (QID) | ORAL | Status: DC | PRN

## 2017-02-21 MED ORDER — PREDNISONE 5 MG PO TABS
5.0000 mg | ORAL_TABLET | Freq: Every day | ORAL | Status: DC
  Administered 2017-02-22: 5 mg via ORAL
  Filled 2017-02-21: qty 1

## 2017-02-21 MED ORDER — ACETAMINOPHEN 650 MG RE SUPP: 650.0000 mg | Freq: Four times a day (QID) | RECTAL | Status: DC | PRN

## 2017-02-21 MED ORDER — METOCLOPRAMIDE HCL 5 MG/ML IJ SOLN: 5.0000 mg | Freq: Three times a day (TID) | INTRAMUSCULAR | Status: DC | PRN

## 2017-02-21 MED ORDER — DIPHENHYDRAMINE HCL 12.5 MG/5ML PO ELIX: 25.0000 mg | ORAL_SOLUTION | ORAL | Status: DC | PRN

## 2017-02-21 MED ORDER — LACTATED RINGERS IV SOLN
INTRAVENOUS | Status: DC | PRN
  Administered 2017-02-21 (×2): via INTRAVENOUS

## 2017-02-21 MED ORDER — MIDAZOLAM HCL 2 MG/2ML IJ SOLN
INTRAMUSCULAR | Status: AC
  Administered 2017-02-21: 2 mg via INTRAVENOUS
  Filled 2017-02-21: qty 2

## 2017-02-21 MED ORDER — METOCLOPRAMIDE HCL 5 MG PO TABS: 5.0000 mg | ORAL_TABLET | Freq: Three times a day (TID) | ORAL | Status: DC | PRN

## 2017-02-21 MED ORDER — OXYCODONE HCL 5 MG PO TABS: 5.0000 mg | ORAL_TABLET | ORAL | 0 refills | Status: DC | PRN

## 2017-02-21 MED ORDER — ESCITALOPRAM OXALATE 10 MG PO TABS
5.0000 mg | ORAL_TABLET | Freq: Every day | ORAL | Status: DC
  Administered 2017-02-22: 5 mg via ORAL
  Filled 2017-02-21: qty 1

## 2017-02-21 MED ORDER — SODIUM CHLORIDE 0.9 % IV SOLN: INTRAVENOUS | Status: DC

## 2017-02-21 MED ORDER — PROMETHAZINE HCL 25 MG/ML IJ SOLN
INTRAMUSCULAR | Status: AC
  Filled 2017-02-21: qty 1

## 2017-02-21 MED ORDER — OXYCODONE HCL 5 MG PO TABS
5.0000 mg | ORAL_TABLET | ORAL | Status: DC | PRN
  Administered 2017-02-21 – 2017-02-22 (×2): 10 mg via ORAL
  Administered 2017-02-22: 15 mg via ORAL
  Filled 2017-02-21: qty 3
  Filled 2017-02-21 (×2): qty 2

## 2017-02-21 MED ORDER — 0.9 % SODIUM CHLORIDE (POUR BTL) OPTIME
TOPICAL | Status: DC | PRN
  Administered 2017-02-21: 1000 mL

## 2017-02-21 MED ORDER — DEXAMETHASONE SODIUM PHOSPHATE 10 MG/ML IJ SOLN
INTRAMUSCULAR | Status: DC | PRN
  Administered 2017-02-21: 10 mg via INTRAVENOUS

## 2017-02-21 MED ORDER — ACETAMINOPHEN 325 MG PO TABS
650.0000 mg | ORAL_TABLET | Freq: Four times a day (QID) | ORAL | Status: DC | PRN
  Administered 2017-02-21 – 2017-02-22 (×3): 650 mg via ORAL
  Filled 2017-02-21 (×3): qty 2

## 2017-02-21 MED ORDER — OXYCODONE HCL 5 MG PO TABS: 5.0000 mg | ORAL_TABLET | ORAL | Status: DC | PRN

## 2017-02-21 MED ORDER — ADULT MULTIVITAMIN W/MINERALS CH
1.0000 | ORAL_TABLET | Freq: Every day | ORAL | Status: DC
  Administered 2017-02-22: 1 via ORAL
  Filled 2017-02-21: qty 1

## 2017-02-21 MED ORDER — METHOCARBAMOL 500 MG PO TABS
500.0000 mg | ORAL_TABLET | Freq: Four times a day (QID) | ORAL | Status: DC | PRN
  Administered 2017-02-21 – 2017-02-22 (×2): 500 mg via ORAL
  Filled 2017-02-21 (×2): qty 1

## 2017-02-21 MED ORDER — PROPOFOL 10 MG/ML IV BOLUS
INTRAVENOUS | Status: AC
  Filled 2017-02-21: qty 20

## 2017-02-21 MED ORDER — MAGNESIUM CITRATE PO SOLN: 1.0000 | Freq: Once | ORAL | Status: DC | PRN

## 2017-02-21 MED ORDER — PROMETHAZINE HCL 25 MG PO TABS: 25.0000 mg | ORAL_TABLET | Freq: Four times a day (QID) | ORAL | 1 refills | Status: DC | PRN

## 2017-02-21 MED ORDER — LACTATED RINGERS IV SOLN: INTRAVENOUS | Status: DC

## 2017-02-21 MED ORDER — CEFAZOLIN SODIUM-DEXTROSE 2-4 GM/100ML-% IV SOLN
INTRAVENOUS | Status: AC
  Filled 2017-02-21: qty 100

## 2017-02-21 MED ORDER — ACETAMINOPHEN 500 MG PO TABS: 500.0000 mg | ORAL_TABLET | Freq: Every day | ORAL | Status: DC | PRN

## 2017-02-21 MED ORDER — LIDOCAINE HCL (CARDIAC) 20 MG/ML IV SOLN
INTRAVENOUS | Status: DC | PRN
  Administered 2017-02-21: 80 mg via INTRAVENOUS

## 2017-02-21 MED ORDER — HYDROXYZINE HCL 10 MG PO TABS
10.0000 mg | ORAL_TABLET | Freq: Three times a day (TID) | ORAL | Status: DC | PRN
  Filled 2017-02-21: qty 1

## 2017-02-21 MED ORDER — BUPIVACAINE-EPINEPHRINE (PF) 0.5% -1:200000 IJ SOLN
INTRAMUSCULAR | Status: DC | PRN
  Administered 2017-02-21: 30 mL via PERINEURAL

## 2017-02-21 MED ORDER — MONTELUKAST SODIUM 10 MG PO TABS
10.0000 mg | ORAL_TABLET | Freq: Every day | ORAL | Status: DC
Start: 2017-02-22 — End: 2017-02-22
  Administered 2017-02-22: 10 mg via ORAL
  Filled 2017-02-21: qty 1

## 2017-02-21 MED ORDER — METHOCARBAMOL 750 MG PO TABS: 750.0000 mg | ORAL_TABLET | Freq: Two times a day (BID) | ORAL | 0 refills | Status: DC | PRN

## 2017-02-21 MED ORDER — HYDROCHLOROTHIAZIDE 25 MG PO TABS
12.5000 mg | ORAL_TABLET | Freq: Every day | ORAL | Status: DC
  Administered 2017-02-22: 12.5 mg via ORAL
  Filled 2017-02-21: qty 1

## 2017-02-21 MED ORDER — SCOPOLAMINE 1 MG/3DAYS TD PT72
MEDICATED_PATCH | TRANSDERMAL | Status: DC | PRN
  Administered 2017-02-21: 1 via TRANSDERMAL

## 2017-02-21 MED ORDER — CEFAZOLIN SODIUM-DEXTROSE 2-4 GM/100ML-% IV SOLN
2.0000 g | Freq: Four times a day (QID) | INTRAVENOUS | Status: DC
  Administered 2017-02-22: 2 g via INTRAVENOUS
  Filled 2017-02-21 (×3): qty 100

## 2017-02-21 MED ORDER — FENTANYL CITRATE (PF) 100 MCG/2ML IJ SOLN
INTRAMUSCULAR | Status: DC | PRN
  Administered 2017-02-21: 50 ug via INTRAVENOUS
  Administered 2017-02-21: 100 ug via INTRAVENOUS

## 2017-02-21 MED ORDER — SENNOSIDES-DOCUSATE SODIUM 8.6-50 MG PO TABS: 1.0000 | ORAL_TABLET | Freq: Every evening | ORAL | 1 refills | Status: DC | PRN

## 2017-02-21 MED ORDER — FENTANYL CITRATE (PF) 100 MCG/2ML IJ SOLN
50.0000 ug | INTRAMUSCULAR | Status: DC | PRN
  Administered 2017-02-21: 50 ug via INTRAVENOUS

## 2017-02-21 MED ORDER — MORPHINE SULFATE (PF) 4 MG/ML IV SOLN: 1.0000 mg | INTRAVENOUS | Status: DC | PRN

## 2017-02-21 MED ORDER — FENTANYL CITRATE (PF) 250 MCG/5ML IJ SOLN
INTRAMUSCULAR | Status: AC
  Filled 2017-02-21: qty 5

## 2017-02-21 MED ORDER — POLYETHYLENE GLYCOL 3350 17 G PO PACK
17.0000 g | PACK | Freq: Every day | ORAL | Status: DC | PRN
  Filled 2017-02-21: qty 1

## 2017-02-21 MED ORDER — ONDANSETRON HCL 4 MG/2ML IJ SOLN
INTRAMUSCULAR | Status: DC | PRN
  Administered 2017-02-21: 4 mg via INTRAVENOUS

## 2017-02-21 MED ORDER — FENTANYL CITRATE (PF) 100 MCG/2ML IJ SOLN
INTRAMUSCULAR | Status: AC
  Administered 2017-02-21: 50 ug via INTRAVENOUS
  Filled 2017-02-21: qty 2

## 2017-02-21 MED ORDER — ASPIRIN EC 325 MG PO TBEC: 325.0000 mg | DELAYED_RELEASE_TABLET | Freq: Two times a day (BID) | ORAL | 0 refills | Status: DC

## 2017-02-21 MED ORDER — CYCLOSPORINE MODIFIED (NEORAL) 25 MG PO CAPS
50.0000 mg | ORAL_CAPSULE | Freq: Every day | ORAL | Status: DC | PRN
  Filled 2017-02-21: qty 2

## 2017-02-21 MED ORDER — SORBITOL 70 % SOLN: 30.0000 mL | Freq: Every day | Status: DC | PRN

## 2017-02-21 MED ORDER — VITAMIN D 1000 UNITS PO TABS
2000.0000 [IU] | ORAL_TABLET | Freq: Every day | ORAL | Status: DC
  Administered 2017-02-22: 2000 [IU] via ORAL
  Filled 2017-02-21: qty 2

## 2017-02-21 MED ORDER — IBUPROFEN 200 MG PO TABS: 200.0000 mg | ORAL_TABLET | Freq: Every day | ORAL | Status: DC | PRN

## 2017-02-21 MED ORDER — FENTANYL CITRATE (PF) 100 MCG/2ML IJ SOLN: 25.0000 ug | INTRAMUSCULAR | Status: DC | PRN

## 2017-02-21 MED ORDER — CEFAZOLIN SODIUM-DEXTROSE 2-4 GM/100ML-% IV SOLN
2.0000 g | INTRAVENOUS | Status: AC
  Administered 2017-02-21: 2 g via INTRAVENOUS

## 2017-02-21 SURGICAL SUPPLY — 66 items
BANDAGE ACE 4X5 VEL STRL LF (GAUZE/BANDAGES/DRESSINGS) ×4 IMPLANT
BANDAGE ACE 6X5 VEL STRL LF (GAUZE/BANDAGES/DRESSINGS) IMPLANT
BANDAGE ESMARK 6X9 LF (GAUZE/BANDAGES/DRESSINGS) IMPLANT
BIT DRILL QC 2.0 SHORT EVOS SM (DRILL) IMPLANT
BIT DRILL QC 2.5MM SHRT EVO SM (DRILL) IMPLANT
BLADE SURG 15 STRL LF DISP TIS (BLADE) ×1 IMPLANT
BLADE SURG 15 STRL SS (BLADE) ×3
BNDG CMPR 9X6 STRL LF SNTH (GAUZE/BANDAGES/DRESSINGS)
BNDG COHESIVE 4X5 TAN STRL (GAUZE/BANDAGES/DRESSINGS) ×3 IMPLANT
BNDG ESMARK 6X9 LF (GAUZE/BANDAGES/DRESSINGS)
BNDG GAUZE ELAST 4 BULKY (GAUZE/BANDAGES/DRESSINGS) ×2 IMPLANT
CANISTER SUCT 3000ML PPV (MISCELLANEOUS) ×3 IMPLANT
COVER SURGICAL LIGHT HANDLE (MISCELLANEOUS) ×3 IMPLANT
CUFF TOURNIQUET SINGLE 34IN LL (TOURNIQUET CUFF) ×2 IMPLANT
CUFF TOURNIQUET SINGLE 44IN (TOURNIQUET CUFF) IMPLANT
DRAPE C-ARM 42X72 X-RAY (DRAPES) ×3 IMPLANT
DRAPE C-ARMOR (DRAPES) ×3 IMPLANT
DRAPE INCISE IOBAN 66X45 STRL (DRAPES) IMPLANT
DRAPE U-SHAPE 47X51 STRL (DRAPES) ×3 IMPLANT
DRILL QC 2.0 SHORT EVOS SM (DRILL) ×3
DRILL QC 2.5MM SHORT EVOS SM (DRILL) ×3
DURAPREP 26ML APPLICATOR (WOUND CARE) ×3 IMPLANT
ELECT CAUTERY BLADE 6.4 (BLADE) ×3 IMPLANT
ELECT REM PT RETURN 9FT ADLT (ELECTROSURGICAL) ×3
ELECTRODE REM PT RTRN 9FT ADLT (ELECTROSURGICAL) ×1 IMPLANT
GAUZE SPONGE 4X4 12PLY STRL (GAUZE/BANDAGES/DRESSINGS) ×3 IMPLANT
GAUZE XEROFORM 1X8 LF (GAUZE/BANDAGES/DRESSINGS) ×2 IMPLANT
GAUZE XEROFORM 5X9 LF (GAUZE/BANDAGES/DRESSINGS) ×1 IMPLANT
GLOVE SKINSENSE NS SZ7.5 (GLOVE) ×2
GLOVE SKINSENSE STRL SZ7.5 (GLOVE) ×1 IMPLANT
GLOVE SURG SYN 7.5  E (GLOVE) ×4
GLOVE SURG SYN 7.5 E (GLOVE) ×2 IMPLANT
GLOVE SURG SYN 7.5 PF PI (GLOVE) ×2 IMPLANT
GOWN STRL REIN XL XLG (GOWN DISPOSABLE) ×3 IMPLANT
K-WIRE 1.6 (WIRE) ×6
K-WIRE FX150X1.6XTROC PNT (WIRE) ×2
KIT BASIN OR (CUSTOM PROCEDURE TRAY) ×3 IMPLANT
KIT ROOM TURNOVER OR (KITS) ×3 IMPLANT
KWIRE FX150X1.6XTROC PNT (WIRE) IMPLANT
NDL HYPO 25GX1X1/2 BEV (NEEDLE) IMPLANT
NEEDLE HYPO 25GX1X1/2 BEV (NEEDLE) IMPLANT
NS IRRIG 1000ML POUR BTL (IV SOLUTION) ×3 IMPLANT
PACK ORTHO EXTREMITY (CUSTOM PROCEDURE TRAY) ×3 IMPLANT
PAD ABD 8X10 STRL (GAUZE/BANDAGES/DRESSINGS) ×2 IMPLANT
PAD ARMBOARD 7.5X6 YLW CONV (MISCELLANEOUS) ×6 IMPLANT
PAD CAST 4YDX4 CTTN HI CHSV (CAST SUPPLIES) IMPLANT
PADDING CAST COTTON 4X4 STRL (CAST SUPPLIES) ×3
PADDING CAST COTTON 6X4 STRL (CAST SUPPLIES) ×3 IMPLANT
PLATE L.DISTAL 2.7/3.5 7H EVOS (Plate) ×2 IMPLANT
SCREW CORT 2.7X15 T8 ST EVOS (Screw) ×2 IMPLANT
SCREW CORT 2.7X16 STAR T8 EVOS (Screw) ×4 IMPLANT
SCREW CORT 2.7X17 ST T8 EVOS (Screw) ×2 IMPLANT
SCREW CORT 3.5X10MM ST EVOS (Screw) ×2 IMPLANT
SCREW CORT EVOS ST 3.5X12 (Screw) ×4 IMPLANT
SCREW CORTICAL 2.7 X 16MM T8 (Screw) ×4 IMPLANT
SUCTION FRAZIER HANDLE 10FR (MISCELLANEOUS) ×2
SUCTION TUBE FRAZIER 10FR DISP (MISCELLANEOUS) ×1 IMPLANT
SUT ETHILON 3 0 PS 1 (SUTURE) IMPLANT
SUT VIC AB 2-0 CT1 27 (SUTURE)
SUT VIC AB 2-0 CT1 TAPERPNT 27 (SUTURE) IMPLANT
SYR CONTROL 10ML LL (SYRINGE) IMPLANT
TOWEL OR 17X24 6PK STRL BLUE (TOWEL DISPOSABLE) ×3 IMPLANT
TOWEL OR 17X26 10 PK STRL BLUE (TOWEL DISPOSABLE) ×6 IMPLANT
TUBE CONNECTING 12'X1/4 (SUCTIONS) ×1
TUBE CONNECTING 12X1/4 (SUCTIONS) ×2 IMPLANT
UNDERPAD 30X30 (UNDERPADS AND DIAPERS) ×3 IMPLANT

## 2017-02-21 NOTE — Anesthesia Procedure Notes (Signed)
Anesthesia Regional Block: Popliteal block   Pre-Anesthetic Checklist: ,, timeout performed, Correct Patient, Correct Site, Correct Laterality, Correct Procedure, Correct Position, site marked, Risks and benefits discussed,  Surgical consent,  Pre-op evaluation,  At surgeon's request and post-op pain management  Laterality: Left  Prep: chloraprep       Needles:  Injection technique: Single-shot  Needle Type: Echogenic Needle     Needle Length: 9cm  Needle Gauge: 21     Additional Needles:   Procedures: ultrasound guided,,,,,,,,  Narrative:  Start time: 02/21/2017 3:35 PM End time: 02/21/2017 3:42 PM Injection made incrementally with aspirations every 5 mL.  Performed by: Personally  Anesthesiologist: Suzette Battiest

## 2017-02-21 NOTE — Progress Notes (Signed)
Orthopedic Tech Progress Note Patient Details:  Theresa Pratt 1971-01-25 [id number]  Ortho Devices Type of Ortho Device: CAM walker Ortho Device/Splint Interventions: Theresa Pratt 02/21/2017, 5:38 PM

## 2017-02-21 NOTE — Op Note (Signed)
   Date of Surgery: 02/21/2017  INDICATIONS: Ms. Cutillo is a 46 y.o.-year-old female who sustained a left ankle fracture; she was indicated for open reduction and internal fixation due to the displaced nature of the articular fracture and came to the operating room today for this procedure. The patient did consent to the procedure after discussion of the risks and benefits.  PREOPERATIVE DIAGNOSIS: left Weber C ankle fracture  POSTOPERATIVE DIAGNOSIS: Same.  PROCEDURE: Open treatment of left ankle fracture with internal fixation. Lateral malleolar CPT [id number].  SURGEON: N. Eduard Roux, M.D.  ASSIST: Benita Stabile, PA necessary for the timely completion of the procedure.  ANESTHESIA:  general  TOURNIQUET TIME: less than 60 mins  IV FLUIDS AND URINE: See anesthesia.  ESTIMATED BLOOD LOSS: minimal mL.  IMPLANTS: [employer]  COMPLICATIONS: None.  DESCRIPTION OF PROCEDURE: The patient was brought to the operating room and placed supine on the operating table.  The patient had been signed prior to the procedure and this was documented. The patient had the anesthesia placed by the anesthesiologist.  A nonsterile tourniquet was placed on the upper thigh.  The prep verification and incision time-outs were performed to confirm that this was the correct patient, site, side and location. The patient had an SCD on the opposite lower extremity. The patient did receive antibiotics prior to the incision and was re-dosed during the procedure as needed at indicated intervals.  The patient had the lower extremity prepped and draped in the standard surgical fashion.  The extremity was exsanguinated using an esmarch bandage and the tourniquet was inflated to 300 mm Hg.  An incision was made on the lateral aspect of the distal fibula. Dissection was carried down to the fascia. The fascia was sharply incised in line with the incision. Subperiosteal elevation was performed. The fracture was exposed. Organized  hematoma was removed. The fracture was then reduced and clamped. Given the orientation of the fracture lag screw could not be placed. We determined it was best to perform fixation in a bridge fashion with the plate. The appropriate length of the plate was placed on the lateral aspect of the fibula at the appropriate position using fluoroscopic guidance. Nonlocking and locking screws were then placed through the plate using standard AO technique. Each screw had excellent purchase. The clamp was removed and the fracture remained reduced. Stress view of the ankle was stable. The wound was then thoroughly irrigated and closed in layer fashion using 0 Vicryl, 2-0 Vicryl, 3-0 nylon. Sterile dressings were applied. Foot was placed in a Cam Walker boot. Patient tolerated procedure well and no me competitions.  POSTOPERATIVE PLAN: Ms. Rundquist will remain nonweightbearing on this leg for approximately 6 weeks; Ms. Huebsch will return for suture removal in 2 weeks.  He will be immobilized in a short leg splint and then transitioned to a CAM walker at his first follow up appointment.  Ms. Farabee will receive DVT prophylaxis based on other medications, activity level, and risk ratio of bleeding to thrombosis.  Azucena Cecil, MD [place] 5:27 PM

## 2017-02-21 NOTE — Anesthesia Preprocedure Evaluation (Addendum)
Anesthesia Evaluation  Patient identified by MRN, date of birth, ID band Patient awake    Reviewed: Allergy & Precautions, NPO status , Patient's Chart, lab work & pertinent test results  History of Anesthesia Complications (+) PONV and history of anesthetic complications  Airway Mallampati: II  TM Distance: >3 FB Neck ROM: Full    Dental no notable dental hx. (+) Dental Advisory Given   Pulmonary neg pulmonary ROS,    Pulmonary exam normal breath sounds clear to auscultation       Cardiovascular hypertension, Pt. on medications Normal cardiovascular exam Rhythm:Regular Rate:Normal     Neuro/Psych PSYCHIATRIC DISORDERS Anxiety Depression negative neurological ROS     GI/Hepatic Neg liver ROS, GERD  Medicated,  Endo/Other  diabetesMorbid obesity  Renal/GU negative Renal ROS     Musculoskeletal negative musculoskeletal ROS (+)   Abdominal (+) + obese,   Peds  Hematology negative hematology ROS (+)   Anesthesia Other Findings   Reproductive/Obstetrics                            Anesthesia Physical Anesthesia Plan  ASA: III  Anesthesia Plan: General   Post-op Pain Management:  Regional for Post-op pain   Induction: Intravenous  PONV Risk Score and Plan: 4 or greater and Ondansetron, Dexamethasone, Midazolam, Scopolamine patch - Pre-op, Propofol infusion and Treatment may vary due to age or medical condition  Airway Management Planned: LMA  Additional Equipment:   Intra-op Plan:   Post-operative Plan: Extubation in OR  Informed Consent: I have reviewed the patients History and Physical, chart, labs and discussed the procedure including the risks, benefits and alternatives for the proposed anesthesia with the patient or authorized representative who has indicated his/her understanding and acceptance.   Dental advisory given  Plan Discussed with: CRNA  Anesthesia Plan Comments:       Anesthesia Quick Evaluation

## 2017-02-21 NOTE — Discharge Instructions (Signed)
° ° °  1. Keep splint clean and dry °2. Elevate foot above level of the heart °3. Take aspirin to prevent blood clots °4. Take pain meds as needed °5. Strict non weight bearing to operative extremity ° °

## 2017-02-21 NOTE — Transfer of Care (Signed)
Immediate Anesthesia Transfer of Care Note  Patient: Theresa Pratt  Procedure(s) Performed: Procedure(s): OPEN REDUCTION INTERNAL FIXATION (ORIF) LEFT LATERAL MALLEOLUS ANKLE FRACTURE (Left)  Patient Location: PACU  Anesthesia Type:GA combined with regional for post-op pain  Level of Consciousness: awake, alert  and patient cooperative  Airway & Oxygen Therapy: Patient Spontanous Breathing  Post-op Assessment: Report given to RN and Post -op Vital signs reviewed and stable  Post vital signs: Reviewed and stable  Last Vitals:  Vitals:   02/21/17 1540 02/21/17 1545  BP: 133/78 128/73  Pulse: 82 77  Resp: 13 11  Temp:    SpO2: 97% 100%    Last Pain:  Vitals:   02/21/17 1340  TempSrc:   PainSc: 3       Patients Stated Pain Goal: 1 (08/81/10 3159)  Complications: No apparent anesthesia complications

## 2017-02-21 NOTE — H&P (Signed)
PREOPERATIVE H&P  Chief Complaint: left lateral malleolus fracture  HPI: Theresa Pratt is a 46 y.o. female who presents for surgical treatment of left lateral malleolus fracture.  She denies any changes in medical history.  Past Medical History:  Diagnosis Date  . ADHD, adult residual type   . Allergic rhinitis    post gastric bypass  . Anxiety   . Arthropathy    weight bearing (pre gastric bypass)  . Diarrhea, functional   . DM (diabetes mellitus) ([place])    pre gastric bypass, pt states she has not been treated for it in years, her doctor doesn't discuss it with her anymore  . GERD (gastroesophageal reflux disease)    post gastric bypass  . Heart murmur    had it in the past  . HTN (hypertension)    Pre gastric bypass  . HTN (hypertension)    post gastric bypass  . IDA (iron deficiency anemia)    post gastric bypass (iron infusion by hematology-doesn't build stores -Dr. Humphrey Rolls)  . Morbid obesity ([place])    Pre gastric bypass  . 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 Oligomenorrhea    on Implanon (pre gastric bypass)  . PONV (postoperative nausea and vomiting)    after 1st surgery  . Postpartum depression    post gastric bypass  . Pre-eclampsia    post gastric bypass  . Restless legs    Past Surgical History:  Procedure Laterality Date  . CHOLECYSTECTOMY  08/2010  . 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 implanon placed  03/16/09   (3 years)  . rectal condyloma removal  2006   Social History   Social History  . Marital status: Married    Spouse name: N/A  . Number of children: 2  . Years of education: N/A   Occupational History  . [employer] Cosmetology Dept Head    Social History Main Topics  . Smoking status: Never Smoker  . Smokeless tobacco: Never Used  . Alcohol use Yes     Comment: occasional  . Drug use: No  . Sexual activity: Yes    Partners: Male    Birth control/ protection: None   Other Topics Concern  . None   Social History  Narrative   Lives with parents, husband, 2 children and small dog    Occupation: new job at [employer], summers off   Activity: did join gym but no regular exercise at this time    Diet: good water, fruits/vegetables daily    Family History  Problem Relation Age of Onset  . Coronary artery disease Father        2 stents; + smoker  . Hypertension Father   . Thyroid disease Brother   . Diabetes Maternal Grandmother   . Cirrhosis Paternal Grandmother   . Alcohol abuse Paternal Grandmother   . Breast cancer Unknown        Paternal great aunt(obesity, smoker, EtOHic)  . Breast cancer Unknown        Maternal great aunt (deceased in 57's from same)  . Thyroid disease Other        cousins and grandmother  . Cancer Other        great aunt - lung  . Liver disease Paternal Aunt   . Cancer Cousin        liver, bile duct  . Cancer Mother        uterine   Allergies  Allergen Reactions  . Sertraline  Hives   Prior to Admission medications   Medication Sig Start Date End Date Taking? Authorizing Provider  acetaminophen (TYLENOL) 500 MG tablet Take 500 mg by mouth daily as needed for moderate pain.    Yes [provider]  Camphor-Menthol (TIGER BALM EXTRA STRENGTH EX) Apply 1 application topically daily as needed (pain).   Yes [provider]  Cetirizine HCl 10 MG CAPS Take 10 mg by mouth daily 08/15/16  Yes [provider]  cholecalciferol (VITAMIN D) 1000 UNITS tablet Take 2,000 Units by mouth daily.   Yes [provider]  cycloSPORINE modified (NEORAL) 50 MG capsule Take 50 mg by mouth daily as needed (hives).  08/15/16  Yes [provider]  escitalopram (LEXAPRO) 5 MG tablet Take 1 tablet (5 mg total) by mouth daily. 12/21/16  Yes [name], MD  hydrochlorothiazide (HYDRODIURIL) 12.5 MG tablet Take 1 tablet (12.5 mg total) by mouth daily. 12/21/16  Yes [name], MD  hydrOXYzine (ATARAX/VISTARIL) 10 MG tablet Take 10 mg by mouth 3 (three)  times daily as needed for itching (hives).   Yes [provider]  ibuprofen (ADVIL,MOTRIN) 200 MG tablet Take 200 mg by mouth daily as needed for moderate pain.    Yes [provider]  Iron-Vitamins (GERITOL PO) Take 1 tablet by mouth daily.   Yes [provider]  montelukast (SINGULAIR) 10 MG tablet Take 10 mg by mouth daily.  08/15/16  Yes [provider]  Multiple Vitamin (MULTIVITAMIN) tablet Take 1 tablet by mouth daily.     Yes [provider]  oxyCODONE (OXY IR/ROXICODONE) 5 MG immediate release tablet Take 1 tablet (5 mg total) by mouth every 4 (four) hours as needed for severe pain. Patient taking differently: Take 5 mg by mouth every 8 (eight) hours as needed for severe pain.  02/19/17  Yes Pete Pelt, PA-C  predniSONE (DELTASONE) 5 MG tablet Take 5 mg by mouth daily.    Yes [provider]  ranitidine (ZANTAC) 150 MG tablet Take 150 mg by mouth daily. Reported on 12/08/2015   Yes [provider]  aspirin EC 325 MG tablet Take 1 tablet (325 mg total) by mouth 2 (two) times daily. 02/21/17   Leandrew Koyanagi, MD  atomoxetine (STRATTERA) 40 MG capsule Take 1 capsule (40 mg total) by mouth daily. Patient not taking: Reported on 02/20/2017 12/21/16   [name], MD  methocarbamol (ROBAXIN) 750 MG tablet Take 1 tablet (750 mg total) by mouth 2 (two) times daily as needed for muscle spasms. 02/21/17   Leandrew Koyanagi, MD  neomycin-bacitracin-polymyxin (NEOSPORIN) ointment Apply 1 application topically daily as needed for wound care.    [provider]  ondansetron (ZOFRAN) 4 MG tablet Take 1-2 tablets (4-8 mg total) by mouth every 8 (eight) hours as needed for nausea or vomiting. 02/21/17   Leandrew Koyanagi, MD  oxyCODONE (OXY IR/ROXICODONE) 5 MG immediate release tablet Take 1-3 tablets (5-15 mg total) by mouth every 4 (four) hours as needed. 02/21/17   Leandrew Koyanagi, MD  promethazine (PHENERGAN) 25 MG tablet Take 1 tablet (25 mg  total) by mouth every 6 (six) hours as needed for nausea. 02/21/17   Leandrew Koyanagi, MD  senna-docusate (SENOKOT S) 8.6-50 MG tablet Take 1 tablet by mouth at bedtime as needed. 02/21/17   Leandrew Koyanagi, MD  vitamin B-12 (CYANOCOBALAMIN) 1000 MCG tablet Take 1 tablet (1,000 mcg total) by mouth daily. Patient not taking: Reported on 02/20/2017 12/21/16  [name], MD     Positive ROS: All other systems have been reviewed and were otherwise negative with the exception of those mentioned in the HPI and as above.  Physical Exam: General: Alert, no acute distress Cardiovascular: No pedal edema Respiratory: No cyanosis, no use of accessory musculature GI: abdomen soft Skin: No lesions in the area of chief complaint Neurologic: Sensation intact distally Psychiatric: Patient is competent for consent with normal mood and affect Lymphatic: no lymphedema  MUSCULOSKELETAL: exam stable  Assessment: left lateral malleolus fracture  Plan: Plan for Procedure(s): OPEN REDUCTION INTERNAL FIXATION (ORIF) LEFT LATERAL MALLEOLUS ANKLE FRACTURE  The risks benefits and alternatives were discussed with the patient including but not limited to the risks of nonoperative treatment, versus surgical intervention including infection, bleeding, nerve injury,  blood clots, cardiopulmonary complications, morbidity, mortality, among others, and they were willing to proceed.   Eduard Roux, MD   02/21/2017 7:37 PM

## 2017-02-21 NOTE — Anesthesia Postprocedure Evaluation (Addendum)
Anesthesia Post Note  Patient: Theresa Pratt  Procedure(s) Performed: Procedure(s) (LRB): OPEN REDUCTION INTERNAL FIXATION (ORIF) LEFT LATERAL MALLEOLUS ANKLE FRACTURE (Left)     Patient location during evaluation: PACU Anesthesia Type: General and Regional Level of consciousness: sedated and patient cooperative Pain management: pain level controlled Vital Signs Assessment: post-procedure vital signs reviewed and stable Respiratory status: spontaneous breathing Cardiovascular status: stable Anesthetic complications: no    Last Vitals:  Vitals:   02/21/17 1745 02/21/17 1800  BP: 135/84 140/87  Pulse: 87 86  Resp: 15 18  Temp:    SpO2: 90% 93%    Last Pain:  Vitals:   02/21/17 1340  TempSrc:   PainSc: 3                  Nolon Nations

## 2017-02-21 NOTE — Anesthesia Procedure Notes (Signed)
Procedure Name: LMA Insertion Date/Time: 02/21/2017 4:04 PM Performed by: Lance Coon Pre-anesthesia Checklist: Patient identified, Emergency Drugs available, Suction available, Patient being monitored and Timeout performed Patient Re-evaluated:Patient Re-evaluated prior to induction Oxygen Delivery Method: Circle system utilized Preoxygenation: Pre-oxygenation with 100% oxygen Induction Type: IV induction LMA: LMA inserted LMA Size: 4.0 Number of attempts: 1 Placement Confirmation: positive ETCO2 and breath sounds checked- equal and bilateral Tube secured with: Tape Dental Injury: Teeth and Oropharynx as per pre-operative assessment

## 2017-02-22 ENCOUNTER — Encounter (HOSPITAL_COMMUNITY): Payer: Self-pay

## 2017-02-22 DIAGNOSIS — S8262XA Displaced fracture of lateral malleolus of left fibula, initial encounter for closed fracture: Secondary | ICD-10-CM | POA: Diagnosis not present

## 2017-02-22 MED ORDER — DIPHENHYDRAMINE HCL 12.5 MG/5ML PO ELIX: 25.0000 mg | ORAL_SOLUTION | ORAL | Status: DC | PRN

## 2017-02-22 MED ORDER — HYDROXYZINE HCL 10 MG PO TABS: 10.0000 mg | ORAL_TABLET | Freq: Three times a day (TID) | ORAL | Status: DC | PRN

## 2017-02-22 MED ORDER — FLUCONAZOLE 150 MG PO TABS: 150.0000 mg | ORAL_TABLET | Freq: Every day | ORAL | 0 refills | Status: DC | PRN

## 2017-02-22 NOTE — Evaluation (Signed)
Physical Therapy Evaluation Patient Details Name: Theresa Pratt MRN: [id number] DOB: 01-10-1971 Today's Date: 02/22/2017   History of Present Illness  Theresa Pratt is an 46 y.o. female who was admitted 02/21/2017 with a diagnosis of L ankle fx and went to the operating room on 02/21/2017 and underwent ORIF L ankle  Clinical Impression  Patient evaluated by Physical Therapy with no further acute PT needs identified. All education has been completed and the patient has no further questions.  See below for any follow-up Physical Therapy or equipment needs. PT is signing off. Thank you for this referral.     Follow Up Recommendations No PT follow up    Equipment Recommendations  Other (comment) (pt has all necessary DME)    Recommendations for Other Services       Precautions / Restrictions Precautions Precautions: Fall Required Braces or Orthoses: Other Brace/Splint Other Brace/Splint: camboot LLE Restrictions Weight Bearing Restrictions: Yes LLE Weight Bearing: Non weight bearing      Mobility  Bed Mobility Overal bed mobility: Modified Independent                Transfers Overall transfer level: Needs assistance Equipment used: Rolling walker (2 wheeled);Crutches Transfers: Sit to/from Stand Sit to Stand: Supervision;Min guard         General transfer comment: multi-modal cues for safety, hand placement, LLE position  Ambulation/Gait Ambulation/Gait assistance: Min guard Ambulation Distance (Feet): 25 Feet (x2) Assistive device: Rolling walker (2 wheeled);Crutches   Gait velocity: also discussed appropriate footwear for RLE (pt has been wearing a shoe with a 2 to 2&1/2 inch heel)   General Gait Details: multi-modal cues for wt shift to UEs, safety  Stairs Stairs: Yes Stairs assistance: Min guard Stair Management: One rail Left;With crutches;Forwards Number of Stairs: 4 General stair comments: multi-modal cues for NWB, technique,  safety  Wheelchair Mobility    Modified Rankin (Stroke Patients Only)       Balance Overall balance assessment: History of Falls;Needs assistance Sitting-balance support: Feet supported;No upper extremity supported Sitting balance-Leahy Scale: Normal     Standing balance support: Single extremity supported Standing balance-Leahy Scale: Fair Standing balance comment: requires UE support for dynamic activities                             Pertinent Vitals/Pain Pain Assessment: No/denies pain    Home Living Family/patient expects to be discharged to:: Private residence Living Arrangements: Spouse/significant other Available Help at Discharge: Family Type of Home: House Home Access: Stairs to enter Entrance Stairs-Rails: Left Entrance Stairs-Number of Steps: 4 Home Layout: Two level;Able to live on main level with bedroom/bathroom Home Equipment: Gilford Rile - 2 wheels;Bedside commode;Crutches;Other (comment) (knee scooter)      Prior Function Level of Independence: Independent         Comments: pt teaches cosmetology;      Hand Dominance        Extremity/Trunk Assessment   Upper Extremity Assessment Upper Extremity Assessment: Overall WFL for tasks assessed    Lower Extremity Assessment Lower Extremity Assessment: LLE deficits/detail LLE: Unable to fully assess due to immobilization       Communication   Communication: No difficulties  Cognition Arousal/Alertness: Awake/alert Behavior During Therapy: WFL for tasks assessed/performed;Impulsive Overall Cognitive Status: Within Functional Limits for tasks assessed  General Comments: pt moves very quickly; cautioned pt regarding safety, NWB, safe use of DME and incr risk of falls when using pain medication combined with  NWB status      General Comments      Exercises     Assessment/Plan    PT Assessment Patent does not need any further PT  services  PT Problem List         PT Treatment Interventions      PT Goals (Current goals can be found in the Care Plan section)  Acute Rehab PT Goals PT Goal Formulation: All assessment and education complete, DC therapy    Frequency     Barriers to discharge        Co-evaluation               AM-PAC PT "6 Clicks" Daily Activity  Outcome Measure Difficulty turning over in bed (including adjusting bedclothes, sheets and blankets)?: None Difficulty moving from lying on back to sitting on the side of the bed? : None Difficulty sitting down on and standing up from a chair with arms (e.g., wheelchair, bedside commode, etc,.)?: A Little Help needed moving to and from a bed to chair (including a wheelchair)?: A Little Help needed walking in hospital room?: A Little Help needed climbing 3-5 steps with a railing? : A Little 6 Click Score: 20    End of Session Equipment Utilized During Treatment: Gait belt Activity Tolerance: Patient tolerated treatment well;No increased pain Patient left: in bed;with call bell/phone within reach;with family/visitor present Nurse Communication: Mobility status PT Visit Diagnosis: Difficulty in walking, not elsewhere classified (R26.2)    Time: 4174-0814 PT Time Calculation (min) (ACUTE ONLY): 34 min   Charges:   PT Evaluation $PT Eval Low Complexity: 1 Low PT Treatments $Gait Training: 8-22 mins   PT G Codes:   PT G-Codes **NOT FOR INPATIENT CLASS** Functional Assessment Tool Used: AM-PAC 6 Clicks Basic Mobility;Clinical judgement Functional Limitation: Mobility: Walking and moving around Mobility: Walking and Moving Around Current Status (G8185): At least 1 percent but less than 20 percent impaired, limited or restricted Mobility: Walking and Moving Around Goal Status [phone]): At least 1 percent but less than 20 percent impaired, limited or restricted Mobility: Walking and Moving Around Discharge Status [phone]): At least 1 percent but  less than 20 percent impaired, limited or restricted    Kenyon Ana, PT Pager: [phone] 02/22/2017   [facility] 02/22/2017, 10:19 AM

## 2017-02-22 NOTE — Progress Notes (Signed)
Patient is doing well this morning with her pain well controlled. Her dressings are clean dry and intact. She is neurovascularly intact. We will have her work with physical therapy this morning for home safety evaluation and then discharge home after that. She is nonweightbearing to the left lower extremity. Follow-up in the office in 2 weeks.

## 2017-02-22 NOTE — Plan of Care (Signed)
Problem: Safety: Goal: Ability to remain free from injury will improve Patients belongings and call bell within reach. Patient calls for assistance when needed. Husband at bedside.

## 2017-02-22 NOTE — Discharge Summary (Signed)
Physician Discharge Summary      Patient ID: SAMIKSHA PELLICANO MRN: [id number] DOB/AGE: [birth year] 46 y.o.  Admit date: 02/21/2017 Discharge date: 02/22/2017  Admission Diagnoses:  <principal problem not specified>  Discharge Diagnoses:  Active Problems:   History of open reduction and internal fixation (ORIF) procedure   Past Medical History:  Diagnosis Date  . ADHD, adult residual type   . Allergic rhinitis    post gastric bypass  . Anxiety   . Arthropathy    weight bearing (pre gastric bypass)  . Diarrhea, functional   . DM (diabetes mellitus) ([place])    pre gastric bypass, pt states she has not been treated for it in years, her doctor doesn't discuss it with her anymore  . GERD (gastroesophageal reflux disease)    post gastric bypass  . Heart murmur    had it in the past  . HTN (hypertension)    Pre gastric bypass  . HTN (hypertension)    post gastric bypass  . IDA (iron deficiency anemia)    post gastric bypass (iron infusion by hematology-doesn't build stores -Dr. Humphrey Rolls)  . Morbid obesity ([place])    Pre gastric bypass  . 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 Oligomenorrhea    on Implanon (pre gastric bypass)  . PONV (postoperative nausea and vomiting)    after 1st surgery  . Postpartum depression    post gastric bypass  . Pre-eclampsia    post gastric bypass  . Restless legs     Surgeries: Procedure(s): OPEN REDUCTION INTERNAL FIXATION (ORIF) LEFT LATERAL MALLEOLUS ANKLE FRACTURE on 02/21/2017   Consultants (if any):   Discharged Condition: Improved  Hospital Course: VALEN MASCARO is an 46 y.o. female who was admitted 02/21/2017 with a diagnosis of <principal problem not specified> and went to the operating room on 02/21/2017 and underwent the above named procedures.    She was given perioperative antibiotics:  Anti-infectives    Start     Dose/Rate Route Frequency Ordered Stop   02/22/17 0600  ceFAZolin (ANCEF) IVPB 2g/100 mL premix     2  g 200 mL/hr over 30 Minutes Intravenous On call to O.R. 02/21/17 1230 02/21/17 1612   02/22/17 0000  fluconazole (DIFLUCAN) 150 MG tablet     150 mg Oral Daily PRN 02/22/17 0727     02/21/17 2300  ceFAZolin (ANCEF) IVPB 2g/100 mL premix     2 g 200 mL/hr over 30 Minutes Intravenous Every 6 hours 02/21/17 2225 02/22/17 1659   02/21/17 1231  ceFAZolin (ANCEF) 2-4 GM/100ML-% IVPB    Comments:  Rosenberger, Meredit: cabinet override      02/21/17 1231 02/21/17 1612    .  She was given sequential compression devices, early ambulation, and aspirin for DVT prophylaxis.  She benefited maximally from the hospital stay and there were no complications.    Recent vital signs:  Vitals:   02/22/17 0009 02/22/17 0458  BP: 123/63 119/76  Pulse: (!) 102 65  Resp: 19 17  Temp: 98.1 F (36.7 C) 98.3 F (36.8 C)  SpO2: 94% 97%    Recent laboratory studies:  Lab Results  Component Value Date   HGB 13.2 02/21/2017   HGB 13.3 12/13/2016   HGB 13.8 12/14/2015   Lab Results  Component Value Date   WBC 12.4 (H) 02/21/2017   PLT 427 (H) 02/21/2017   No results found for: INR Lab Results  Component Value Date   NA 138 02/21/2017   K 3.6 02/21/2017  CL 103 02/21/2017   CO2 26 02/21/2017   BUN 12 02/21/2017   CREATININE 0.97 02/21/2017   GLUCOSE 102 (H) 02/21/2017    Discharge Medications:   Allergies as of 02/22/2017      Reactions   Sertraline Hives      Medication List    TAKE these medications   acetaminophen 500 MG tablet Commonly known as:  TYLENOL Take 500 mg by mouth daily as needed for moderate pain.   aspirin EC 325 MG tablet Take 1 tablet (325 mg total) by mouth 2 (two) times daily.   atomoxetine 40 MG capsule Commonly known as:  STRATTERA Take 1 capsule (40 mg total) by mouth daily.   Cetirizine HCl 10 MG Caps Take 10 mg by mouth daily   cholecalciferol 1000 units tablet Commonly known as:  VITAMIN D Take 2,000 Units by mouth daily.   cycloSPORINE  modified 50 MG capsule Commonly known as:  NEORAL Take 50 mg by mouth daily as needed (hives).   escitalopram 5 MG tablet Commonly known as:  LEXAPRO Take 1 tablet (5 mg total) by mouth daily.   fluconazole 150 MG tablet Commonly known as:  DIFLUCAN Take 1 tablet (150 mg total) by mouth daily as needed.   GERITOL PO Take 1 tablet by mouth daily.   hydrochlorothiazide 12.5 MG tablet Commonly known as:  HYDRODIURIL Take 1 tablet (12.5 mg total) by mouth daily.   hydrOXYzine 10 MG tablet Commonly known as:  ATARAX/VISTARIL Take 10 mg by mouth 3 (three) times daily as needed for itching (hives).   ibuprofen 200 MG tablet Commonly known as:  ADVIL,MOTRIN Take 200 mg by mouth daily as needed for moderate pain.   methocarbamol 750 MG tablet Commonly known as:  ROBAXIN Take 1 tablet (750 mg total) by mouth 2 (two) times daily as needed for muscle spasms.   montelukast 10 MG tablet Commonly known as:  SINGULAIR Take 10 mg by mouth daily.   multivitamin tablet Take 1 tablet by mouth daily.   neomycin-bacitracin-polymyxin ointment Commonly known as:  NEOSPORIN Apply 1 application topically daily as needed for wound care.   ondansetron 4 MG tablet Commonly known as:  ZOFRAN Take 1-2 tablets (4-8 mg total) by mouth every 8 (eight) hours as needed for nausea or vomiting.   oxyCODONE 5 MG immediate release tablet Commonly known as:  Oxy IR/ROXICODONE Take 1 tablet (5 mg total) by mouth every 4 (four) hours as needed for severe pain. What changed:  when to take this   oxyCODONE 5 MG immediate release tablet Commonly known as:  Oxy IR/ROXICODONE Take 1-3 tablets (5-15 mg total) by mouth every 4 (four) hours as needed. What changed:  You were already taking a medication with the same name, and this prescription was added. Make sure you understand how and when to take each.   predniSONE 5 MG tablet Commonly known as:  DELTASONE Take 5 mg by mouth daily.   promethazine 25 MG  tablet Commonly known as:  PHENERGAN Take 1 tablet (25 mg total) by mouth every 6 (six) hours as needed for nausea.   ranitidine 150 MG tablet Commonly known as:  ZANTAC Take 150 mg by mouth daily. Reported on 12/08/2015   senna-docusate 8.6-50 MG tablet Commonly known as:  SENOKOT S Take 1 tablet by mouth at bedtime as needed.   TIGER BALM EXTRA STRENGTH EX Apply 1 application topically daily as needed (pain).   vitamin B-12 1000 MCG tablet Commonly known as:  CYANOCOBALAMIN Take 1 tablet (1,000 mcg total) by mouth daily.            Durable Medical Equipment        Start     Ordered   02/21/17 2226  DME 3 n 1  Once     02/21/17 2225      Diagnostic Studies: Dg Tibia/fibula Left  Result Date: 02/13/2017 CLINICAL DATA:  Initial evaluation for acute trauma, fall. EXAM: LEFT TIBIA AND FIBULA - 2 VIEW COMPARISON:  None. FINDINGS: Acute comminuted fracture of the distal fibula with slight posterior displacement. No other acute fracture dislocation. Tibia intact. Mild degenerative osteoarthritic changes noted about the knee and ankle. Mild soft tissue swelling noted about the distal tibia/ fibula and ankle. IMPRESSION: 1. Acute comminuted oblique fracture of the distal left fibular shaft with slight posterior displacement. 2. No other acute fracture or dislocation about the tibia and fibula. Electronically Signed   By: Jeannine Boga M.D.   On: 02/13/2017 22:30   Dg Ankle 2 Views Left  Result Date: 02/21/2017 CLINICAL DATA:  ORIF for left fibula fracture. EXAM: DG C-ARM 61-120 MIN; LEFT ANKLE - 2 VIEW COMPARISON:  02/13/2017 FINDINGS: 3 spot fluoro film show lateral compression plate and screw fixation of the comminuted distal fibula fracture. No evidence for immediate hardware complications. IMPRESSION: Intraoperative evaluation during ORIF for distal fibula fracture. Electronically Signed   By: Misty Stanley M.D.   On: 02/21/2017 20:30   Dg Ankle Complete Left  Result Date:  02/13/2017 CLINICAL DATA:  Initial evaluation for acute trauma, fall. EXAM: LEFT ANKLE COMPLETE - 3+ VIEW COMPARISON:  None. FINDINGS: Acute comminuted oblique fracture of the distal left fibular shaft with slight posterior displacement. Fracture extends through the syndesmosis without intra-articular extension. Ankle mortise approximated. Distal tibia intact. Mild soft tissue swelling about the ankle. IMPRESSION: Acute comminuted oblique fracture of the distal left fibula with slight posterior displacement. Electronically Signed   By: Jeannine Boga M.D.   On: 02/13/2017 22:31   Dg C-arm 1-60 Min  Result Date: 02/21/2017 CLINICAL DATA:  ORIF for left fibula fracture. EXAM: DG C-ARM 61-120 MIN; LEFT ANKLE - 2 VIEW COMPARISON:  02/13/2017 FINDINGS: 3 spot fluoro film show lateral compression plate and screw fixation of the comminuted distal fibula fracture. No evidence for immediate hardware complications. IMPRESSION: Intraoperative evaluation during ORIF for distal fibula fracture. Electronically Signed   By: Misty Stanley M.D.   On: 02/21/2017 20:30    Disposition: 01-Home or Self Care  Discharge Instructions    Call MD / Call 911    Complete by:  As directed    If you experience chest pain or shortness of breath, CALL 911 and be transported to the hospital emergency room.  If you develope a fever above 101.5 F, pus (white drainage) or increased drainage or redness at the wound, or calf pain, call your surgeon's office.   Constipation Prevention    Complete by:  As directed    Drink plenty of fluids.  Prune juice may be helpful.  You may use a stool softener, such as Colace (over the counter) 100 mg twice a day.  Use MiraLax (over the counter) for constipation as needed.   Driving restrictions    Complete by:  As directed    No driving while taking narcotic pain meds.   Increase activity slowly as tolerated    Complete by:  As directed       Follow-up Information    Erlinda Hong,  Marylynn Pearson, MD In  2 weeks.   Specialty:  Orthopedic Surgery Why:  For suture removal, For wound re-check Contact information: [place] [address] [phone]            Signed: Eduard Roux 02/22/2017, 7:52 AM

## 2017-02-25 ENCOUNTER — Encounter (HOSPITAL_COMMUNITY): Payer: Self-pay | Admitting: Orthopaedic Surgery

## 2017-03-07 ENCOUNTER — Ambulatory Visit (INDEPENDENT_AMBULATORY_CARE_PROVIDER_SITE_OTHER): Payer: BC Managed Care – PPO

## 2017-03-07 ENCOUNTER — Ambulatory Visit (INDEPENDENT_AMBULATORY_CARE_PROVIDER_SITE_OTHER): Payer: BC Managed Care – PPO | Admitting: Physician Assistant

## 2017-03-07 ENCOUNTER — Telehealth (INDEPENDENT_AMBULATORY_CARE_PROVIDER_SITE_OTHER): Payer: Self-pay | Admitting: Radiology

## 2017-03-07 DIAGNOSIS — S8265XD Nondisplaced fracture of lateral malleolus of left fibula, subsequent encounter for closed fracture with routine healing: Secondary | ICD-10-CM

## 2017-03-07 MED ORDER — HYDROCODONE-ACETAMINOPHEN 5-325 MG PO TABS: 1.0000 | ORAL_TABLET | Freq: Four times a day (QID) | ORAL | 0 refills | Status: DC | PRN

## 2017-03-07 NOTE — Telephone Encounter (Signed)
There are two sets of directions for use on the hydrocodone Rx from today, I verified per Artis Delay that it is to be 1 po q 6 hrs prn, and I have advised pharmacist.

## 2017-03-07 NOTE — Progress Notes (Signed)
Office Visit Note   Patient: Theresa Pratt           Date of Birth: Feb 06, 1971           MRN: [id number] Visit Date: 03/07/2017              Requested by: [name], MD [address] [place], [place] [address] PCP: [name], MD   Assessment & Plan: Visit Diagnoses:  1. Closed nondisplaced fracture of lateral malleolus of left fibula with routine healing, subsequent encounter     Plan:  Sutures were removed today. Steri-Strips applied. She'll get the incision wet in the shower. She's weightbearing as tolerated Cam Walker boot. Elevation of the ankle and wiggling of toes encouraged. She'll follow up with Dr. Erlinda Hong in 2 weeks and discuss her returning to work.  Follow-Up Instructions: Return in about 2 weeks (around 03/21/2017).   Orders:  Orders Placed This Encounter  Procedures  . XR Ankle Complete Left   Meds ordered this encounter  Medications  . HYDROcodone-acetaminophen (NORCO) 5-325 MG tablet    Sig: Take 1 tablet by mouth every 6 (six) hours as needed for moderate pain. One to two tabs every 4-6 hours for pain    Dispense:  60 tablet    Refill:  0      Procedures: No procedures performed   Clinical Data: No additional findings.   Subjective: Postop left lateral malleolus fracture  HPI Theresa Pratt returns today follow-up 14 days status post open reduction internal fixation left lateral malleolus fracture. She is overall doing well. She has no complaints. She does state she has been applying some weight in the boot. She has had no fevers chills shortness breath or chest pain. She is requesting a refill on pain medication . She wants something something less strong than the oxycodone.  Review of Systems   Objective: Vital Signs: There were no vitals taken for this visit.  Physical Exam  Constitutional: She appears well-developed and well-nourished. No distress.  Cardiovascular: Intact distal pulses.   Skin: She is not  diaphoretic.  Psychiatric: She has a normal mood and affect. Her behavior is normal.    Ortho Exam Left lateral ankle surgical incisions healing well no signs of infection. Wounds are well approximated with horizontal interrupted nylon sutures. Calf supple nontender. Sensation grossly intact throughout the foot. Specialty Comments:  No specialty comments available.  Imaging: Xr Ankle Complete Left  Result Date: 03/07/2017 3 views left ankle: Talus well located within the ankle mortise no diastases. Status post open reduction internal fixation lateral malleolus fracture with excellent position overall alignment. No hardware failure.    PMFS History: Patient Active Problem List   Diagnosis Date Noted  . History of open reduction and internal fixation (ORIF) procedure 02/21/2017  . Displaced fracture of lateral malleolus of left fibula, initial encounter for closed fracture 02/19/2017  . Sore throat 12/21/2016  . Vitamin B12 deficiency 12/21/2016  . Right foot pain 12/07/2016  . Inattention 05/29/2016  . Chronic urticaria 03/05/2016  . Adjustment disorder with anxiety 02/28/2016  . Anxiety 06/29/2015  . 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 Work-related stress 04/28/2014  . Menorrhagia 07/04/2012  . 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 Healthcare maintenance 06/17/2012  . BREAST MASS, LEFT 07/21/2010  . BACK PAIN, LUMBAR 07/14/2010  . Vitamin D deficiency 07/03/2010  . ALLERGIC RHINITIS 07/03/2010  . GERD 07/03/2010  . Bariatric surgery status 07/03/2010  . Essential hypertension 06/22/2010   Past Medical History:  Diagnosis Date  . ADHD, adult residual type   . Allergic rhinitis    post gastric bypass  . Anxiety   . Arthropathy    weight bearing (pre gastric bypass)  . Diarrhea, functional   . DM (diabetes mellitus) ([place])    pre gastric bypass, pt states she has not been treated for it in years, her doctor doesn't discuss it with her anymore  . GERD  (gastroesophageal reflux disease)    post gastric bypass  . Heart murmur    had it in the past  . HTN (hypertension)    Pre gastric bypass  . HTN (hypertension)    post gastric bypass  . IDA (iron deficiency anemia)    post gastric bypass (iron infusion by hematology-doesn't build stores -Dr. Humphrey Rolls)  . Morbid obesity ([place])    Pre gastric bypass  . 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 Oligomenorrhea    on Implanon (pre gastric bypass)  . PONV (postoperative nausea and vomiting)    after 1st surgery  . Postpartum depression    post gastric bypass  . Pre-eclampsia    post gastric bypass  . Restless legs     Family History  Problem Relation Age of Onset  . Coronary artery disease Father        2 stents; + smoker  . Hypertension Father   . Thyroid disease Brother   . Diabetes Maternal Grandmother   . Cirrhosis Paternal Grandmother   . Alcohol abuse Paternal Grandmother   . Breast cancer Unknown        Paternal great aunt(obesity, smoker, EtOHic)  . Breast cancer Unknown        Maternal great aunt (deceased in 22's from same)  . Thyroid disease Other        cousins and grandmother  . Cancer Other        great aunt - lung  . Liver disease Paternal Aunt   . Cancer Cousin        liver, bile duct  . Cancer Mother        uterine    Past Surgical History:  Procedure Laterality Date  . CHOLECYSTECTOMY  08/2010  . 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 implanon placed  03/16/09   (3 years)  . ORIF ANKLE FRACTURE Left 02/2017   L lat malleolar fracture Erlinda Hong)  . ORIF ANKLE FRACTURE Left 02/21/2017   Procedure: OPEN REDUCTION INTERNAL FIXATION (ORIF) LEFT LATERAL MALLEOLUS ANKLE FRACTURE;  Surgeon: Leandrew Koyanagi, MD;  Location: [facility];  Service: Orthopedics;  Laterality: Left;  . rectal condyloma removal  2006   Social History   Occupational History  . [employer] Dept Head    Social History Main Topics  . Smoking status: Never Smoker  . Smokeless tobacco: Never  Used  . Alcohol use Yes     Comment: occasional  . Drug use: No  . Sexual activity: Yes    Partners: Male    Birth control/ protection: None

## 2017-03-09 ENCOUNTER — Ambulatory Visit
Admission: EM | Admit: 2017-03-09 | Discharge: 2017-03-09 | Disposition: A | Discharge status: Home / Self Care | Payer: BC Managed Care – PPO | Attending: Family Medicine | Admitting: Family Medicine

## 2017-03-09 DIAGNOSIS — L509 Urticaria, unspecified: Secondary | ICD-10-CM

## 2017-03-09 DIAGNOSIS — L03116 Cellulitis of left lower limb: Secondary | ICD-10-CM | POA: Diagnosis not present

## 2017-03-09 MED ORDER — PREDNISONE 20 MG PO TABS: 20.0000 mg | ORAL_TABLET | Freq: Every day | ORAL | 0 refills | Status: DC

## 2017-03-09 MED ORDER — SULFAMETHOXAZOLE-TRIMETHOPRIM 800-160 MG PO TABS: 1.0000 | ORAL_TABLET | Freq: Two times a day (BID) | ORAL | 0 refills | Status: DC

## 2017-03-09 NOTE — ED Triage Notes (Signed)
Pt with "chronic hives" and takes Cyclosporin for it but recently had ankle surgery so has been off her Cyclosporin due to this. Has noticed hives "all over" and very itchy.

## 2017-03-09 NOTE — ED Provider Notes (Signed)
[facility]    CSN: [id number] Arrival date & time: 03/09/17  0803     History   Chief Complaint Chief Complaint  Patient presents with  . Urticaria    HPI Theresa Pratt is a 46 y.o. female.   46 yo female with a c/o hives for the past 2 days associated with some redness and swelling around surgical incision site on left lower leg. Patient states she has a h/o chronic hives and has been on chronic cyclosporine which was discontinued prior to her surgery 2 weeks ago. States surgical incision site has been itching recently and she has been scratching. Yesterday she noticed some slight redness and tenderness which seems slightly worse today. Denies any fevers, chills or drainage.    The history is provided by the patient.  Urticaria     Past Medical History:  Diagnosis Date  . ADHD, adult residual type   . Allergic rhinitis    post gastric bypass  . Anxiety   . Arthropathy    weight bearing (pre gastric bypass)  . Diarrhea, functional   . DM (diabetes mellitus) ([place])    pre gastric bypass, pt states she has not been treated for it in years, her doctor doesn't discuss it with her anymore  . GERD (gastroesophageal reflux disease)    post gastric bypass  . Heart murmur    had it in the past  . HTN (hypertension)    Pre gastric bypass  . HTN (hypertension)    post gastric bypass  . IDA (iron deficiency anemia)    post gastric bypass (iron infusion by hematology-doesn't build stores -Dr. Humphrey Rolls)  . Morbid obesity ([place])    Pre gastric bypass  . 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 Oligomenorrhea    on Implanon (pre gastric bypass)  . PONV (postoperative nausea and vomiting)    after 1st surgery  . Postpartum depression    post gastric bypass  . Pre-eclampsia    post gastric bypass  . Restless legs     Patient Active Problem List   Diagnosis Date Noted  . History of open reduction and internal fixation (ORIF) procedure 02/21/2017  .  Displaced fracture of lateral malleolus of left fibula, initial encounter for closed fracture 02/19/2017  . Sore throat 12/21/2016  . Vitamin B12 deficiency 12/21/2016  . Right foot pain 12/07/2016  . Inattention 05/29/2016  . Chronic urticaria 03/05/2016  . Adjustment disorder with anxiety 02/28/2016  . Anxiety 06/29/2015  . 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 Work-related stress 04/28/2014  . Menorrhagia 07/04/2012  . 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 Healthcare maintenance 06/17/2012  . BREAST MASS, LEFT 07/21/2010  . BACK PAIN, LUMBAR 07/14/2010  . Vitamin D deficiency 07/03/2010  . ALLERGIC RHINITIS 07/03/2010  . GERD 07/03/2010  . Bariatric surgery status 07/03/2010  . Essential hypertension 06/22/2010    Past Surgical History:  Procedure Laterality Date  . CHOLECYSTECTOMY  08/2010  . 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 implanon placed  03/16/09   (3 years)  . ORIF ANKLE FRACTURE Left 02/2017   L lat malleolar fracture Erlinda Hong)  . ORIF ANKLE FRACTURE Left 02/21/2017   Procedure: OPEN REDUCTION INTERNAL FIXATION (ORIF) LEFT LATERAL MALLEOLUS ANKLE FRACTURE;  Surgeon: Leandrew Koyanagi, MD;  Location: [facility];  Service: Orthopedics;  Laterality: Left;  . rectal condyloma removal  2006    OB History    Gravida Para Term Preterm AB Living   2 2 2  2   SAB TAB Ectopic Multiple Live Births                   Home Medications    Prior to Admission medications   Medication Sig Start Date End Date Taking? Authorizing Provider  acetaminophen (TYLENOL) 500 MG tablet Take 500 mg by mouth daily as needed for moderate pain.     [provider]  aspirin EC 325 MG tablet Take 1 tablet (325 mg total) by mouth 2 (two) times daily. 02/21/17   Leandrew Koyanagi, MD  atomoxetine (STRATTERA) 40 MG capsule Take 1 capsule (40 mg total) by mouth daily. Patient not taking: Reported on 02/20/2017 12/21/16   [name], MD  Camphor-Menthol (TIGER BALM EXTRA STRENGTH EX) Apply 1  application topically daily as needed (pain).    [provider]  Cetirizine HCl 10 MG CAPS Take 10 mg by mouth daily 08/15/16   [provider]  cholecalciferol (VITAMIN D) 1000 UNITS tablet Take 2,000 Units by mouth daily.    [provider]  cycloSPORINE modified (NEORAL) 50 MG capsule Take 50 mg by mouth daily as needed (hives).  08/15/16   [provider]  escitalopram (LEXAPRO) 5 MG tablet Take 1 tablet (5 mg total) by mouth daily. 12/21/16   [name], MD  fluconazole (DIFLUCAN) 150 MG tablet Take 1 tablet (150 mg total) by mouth daily as needed. 02/22/17   Leandrew Koyanagi, MD  hydrochlorothiazide (HYDRODIURIL) 12.5 MG tablet Take 1 tablet (12.5 mg total) by mouth daily. 12/21/16   [name], MD  HYDROcodone-acetaminophen (NORCO) 5-325 MG tablet Take 1 tablet by mouth every 6 (six) hours as needed for moderate pain. One to two tabs every 4-6 hours for pain 03/07/17   Pete Pelt, PA-C  hydrOXYzine (ATARAX/VISTARIL) 10 MG tablet Take 10 mg by mouth 3 (three) times daily as needed for itching (hives).    [provider]  ibuprofen (ADVIL,MOTRIN) 200 MG tablet Take 200 mg by mouth daily as needed for moderate pain.     [provider]  Iron-Vitamins (GERITOL PO) Take 1 tablet by mouth daily.    [provider]  methocarbamol (ROBAXIN) 750 MG tablet Take 1 tablet (750 mg total) by mouth 2 (two) times daily as needed for muscle spasms. 02/21/17   Leandrew Koyanagi, MD  montelukast (SINGULAIR) 10 MG tablet Take 10 mg by mouth daily.  08/15/16   [provider]  Multiple Vitamin (MULTIVITAMIN) tablet Take 1 tablet by mouth daily.      [provider]  neomycin-bacitracin-polymyxin (NEOSPORIN) ointment Apply 1 application topically daily as needed for wound care.    [provider]  ondansetron (ZOFRAN) 4 MG tablet Take 1-2 tablets (4-8 mg total) by mouth every 8 (eight) hours as needed for nausea or vomiting.  02/21/17   Leandrew Koyanagi, MD  oxyCODONE (OXY IR/ROXICODONE) 5 MG immediate release tablet Take 1 tablet (5 mg total) by mouth every 4 (four) hours as needed for severe pain. Patient taking differently: Take 5 mg by mouth every 8 (eight) hours as needed for severe pain.  02/19/17   Pete Pelt, PA-C  oxyCODONE (OXY IR/ROXICODONE) 5 MG immediate release tablet Take 1-3 tablets (5-15 mg total) by mouth every 4 (four) hours as needed. 02/21/17   Leandrew Koyanagi, MD  predniSONE (DELTASONE) 20 MG tablet Take 1 tablet (20 mg total) by mouth daily. 03/09/17   Norval Gable, MD  promethazine (PHENERGAN) 25 MG  tablet Take 1 tablet (25 mg total) by mouth every 6 (six) hours as needed for nausea. 02/21/17   Leandrew Koyanagi, MD  ranitidine (ZANTAC) 150 MG tablet Take 150 mg by mouth daily. Reported on 12/08/2015    [provider]  senna-docusate (SENOKOT S) 8.6-50 MG tablet Take 1 tablet by mouth at bedtime as needed. 02/21/17   Leandrew Koyanagi, MD  sulfamethoxazole-trimethoprim (BACTRIM DS,SEPTRA DS) 800-160 MG tablet Take 1 tablet by mouth 2 (two) times daily. 03/09/17   Norval Gable, MD  vitamin B-12 (CYANOCOBALAMIN) 1000 MCG tablet Take 1 tablet (1,000 mcg total) by mouth daily. Patient not taking: Reported on 02/20/2017 12/21/16   [name], MD    Family History Family History  Problem Relation Age of Onset  . Coronary artery disease Father        2 stents; + smoker  . Hypertension Father   . Thyroid disease Brother   . Diabetes Maternal Grandmother   . Cirrhosis Paternal Grandmother   . Alcohol abuse Paternal Grandmother   . Breast cancer Unknown        Paternal great aunt(obesity, smoker, EtOHic)  . Breast cancer Unknown        Maternal great aunt (deceased in 2's from same)  . Thyroid disease Other        cousins and grandmother  . Cancer Other        great aunt - lung  . Liver disease Paternal Aunt   . Cancer Cousin        liver, bile duct  . Cancer Mother        uterine     Social History Social History  Substance Use Topics  . Smoking status: Never Smoker  . Smokeless tobacco: Never Used  . Alcohol use Yes     Comment: occasional     Allergies   Sertraline   Review of Systems Review of Systems   Physical Exam Triage Vital Signs ED Triage Vitals  Enc Vitals Group     BP 03/09/17 0811 (!) 140/100     Pulse Rate 03/09/17 0811 100     Resp 03/09/17 0811 18     Temp 03/09/17 0811 (!) 97.4 F (36.3 C)     Temp Source 03/09/17 0811 Oral     SpO2 03/09/17 0811 100 %     Weight 03/09/17 0813 254 lb (115.2 kg)     Height 03/09/17 0813 5\' 6"  (1.676 m)     Head Circumference --      Peak Flow --      Pain Score 03/09/17 0813 0     Pain Loc --      Pain Edu? --      Excl. in [place]? --    No data found.   Updated Vital Signs BP (!) 140/100 (BP Location: Left Arm)   Pulse 100   Temp (!) 97.4 F (36.3 C) (Oral)   Resp 18   Ht 5\' 6"  (1.676 m)   Wt 254 lb (115.2 kg)   LMP 03/08/2017   SpO2 100%   BMI 41.00 kg/m   Visual Acuity Right Eye Distance:   Left Eye Distance:   Bilateral Distance:    Right Eye Near:   Left Eye Near:    Bilateral Near:     Physical Exam  Constitutional: She appears well-developed and well-nourished. No distress.  Musculoskeletal:  Left lateral ankle area with well healed surgical scar; with mild surrounding blanchable erythema, edema, and warmth  to touch; no drainage  Skin: Rash noted. Rash is urticarial (diffuse urticarial rash on trunk and extremities). She is not diaphoretic.  Nursing note and vitals reviewed.    UC Treatments / Results  Labs (all labs ordered are listed, but only abnormal results are displayed) Labs Reviewed - No data to display  EKG  EKG Interpretation None       Radiology Xr Ankle Complete Left  Result Date: 03/07/2017 3 views left ankle: Talus well located within the ankle mortise no diastases. Status post open reduction internal fixation lateral malleolus fracture  with excellent position overall alignment. No hardware failure.   Procedures Procedures (including critical care time)  Medications Ordered in UC Medications - No data to display   Initial Impression / Assessment and Plan / UC Course  I have reviewed the triage vital signs and the nursing notes.  Pertinent labs & imaging results that were available during my care of the patient were reviewed by me and considered in my medical decision making (see chart for details).       Final Clinical Impressions(s) / UC Diagnoses   Final diagnoses:  Cellulitis of left foot  Hives    New Prescriptions Discharge Medication List as of 03/09/2017  8:41 AM    START taking these medications   Details  sulfamethoxazole-trimethoprim (BACTRIM DS,SEPTRA DS) 800-160 MG tablet Take 1 tablet by mouth 2 (two) times daily., Starting Sat 03/09/2017, Normal       1. diagnosis reviewed with patient 2. rx as per orders above; reviewed possible side effects, interactions, risks and benefits  3. Recommend supportive treatment with elevation, warm compresses 4. Contact her orthopedic surgeon next week to notify and follow up  5. Follow-up here prn if symptoms worsen or don't improve Controlled Substance Prescriptions [place] Controlled Substance Registry consulted? Not Applicable   Norval Gable, MD 03/09/17 [phone]

## 2017-03-21 ENCOUNTER — Ambulatory Visit (INDEPENDENT_AMBULATORY_CARE_PROVIDER_SITE_OTHER): Payer: BC Managed Care – PPO | Admitting: Physician Assistant

## 2017-03-21 ENCOUNTER — Encounter (INDEPENDENT_AMBULATORY_CARE_PROVIDER_SITE_OTHER): Payer: Self-pay | Admitting: Physician Assistant

## 2017-03-21 DIAGNOSIS — S8262XA Displaced fracture of lateral malleolus of left fibula, initial encounter for closed fracture: Secondary | ICD-10-CM

## 2017-03-21 NOTE — Progress Notes (Signed)
Office Visit Note   Patient: Theresa Pratt           Date of Birth: 1971/01/10           MRN: [id number] Visit Date: 03/21/2017              Requested by: Theresa Bush, MD [address] [place], [place] [address] PCP: Theresa Bush, MD   Assessment & Plan: Visit Diagnoses:  1. Displaced fracture of lateral malleolus of left fibula, initial encounter for closed fracture     Plan: She will stop her Bactrim. We'll have her wash the ankle wound daily with antibacterial soap. She's to watch for any signs of infection. Gave her a note to return to work on September 17 half days and then full duties 1 week later. She'll continue to be weightbearing as tolerated Cam Walker boot. Follow with Dr. Lawanna Pratt in 2 weeks obtain 3 views of the left ankle at that time.  Follow-Up Instructions: Return in about 2 weeks (around 04/04/2017) for With Dr. Erlinda Pratt, Radiographs.   Orders:  No orders of the defined types were placed in this encounter.  No orders of the defined types were placed in this encounter.     Procedures: No procedures performed   Clinical Data: No additional findings.   Subjective: Chief Complaint  Patient presents with  . Left Ankle - Pain    HPI Theresa Pratt states she was to the [facility] urgent care on 03/09/2017 due to redness around her left foot and ankle. She was diagnosed as cellulitis. She states she has hives from time to time and these often started out in the foot. She was placed on Bactrim which caused her to have an allergic reaction so she cut the dosage and half. She's been on now 10 days of Bactrim. Otherwise she states the ankle is doing well.  Review of Systems Denies fevers chills shortness of breath.  Objective: Vital Signs: LMP 03/08/2017   Physical Exam  Constitutional: She is oriented to person, place, and time. She appears well-developed and well-nourished. No distress.  Pulmonary/Chest: Effort normal.  Neurological: She  is alert and oriented to person, place, and time.  Skin: She is not diaphoretic.    Ortho Exam Left ankle she has good dorsiflexion plantar flexion. Some minimal tenderness over the lateral malleolus. Surgical incision is healing well. She has to use bed Vicryl sutures no signs of gross infection. Specialty Comments:  No specialty comments available.  Imaging: No results found.   PMFS History: Patient Active Problem List   Diagnosis Date Noted  . History of open reduction and internal fixation (ORIF) procedure 02/21/2017  . Displaced fracture of lateral malleolus of left fibula, initial encounter for closed fracture 02/19/2017  . Sore throat 12/21/2016  . Vitamin B12 deficiency 12/21/2016  . Right foot pain 12/07/2016  . Inattention 05/29/2016  . Chronic urticaria 03/05/2016  . Adjustment disorder with anxiety 02/28/2016  . Anxiety 06/29/2015  . 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 Work-related stress 04/28/2014  . Menorrhagia 07/04/2012  . 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 Healthcare maintenance 06/17/2012  . BREAST MASS, LEFT 07/21/2010  . BACK PAIN, LUMBAR 07/14/2010  . Vitamin D deficiency 07/03/2010  . ALLERGIC RHINITIS 07/03/2010  . GERD 07/03/2010  . Bariatric surgery status 07/03/2010  . Essential hypertension 06/22/2010   Past Medical History:  Diagnosis Date  . ADHD, adult residual type   . Allergic rhinitis    post gastric bypass  .  Anxiety   . Arthropathy    weight bearing (pre gastric bypass)  . Diarrhea, functional   . DM (diabetes mellitus) ([place])    pre gastric bypass, pt states she has not been treated for it in years, her doctor doesn't discuss it with her anymore  . GERD (gastroesophageal reflux disease)    post gastric bypass  . Heart murmur    had it in the past  . HTN (hypertension)    Pre gastric bypass  . HTN (hypertension)    post gastric bypass  . IDA (iron deficiency anemia)    post gastric bypass (iron infusion by  hematology-doesn't build stores -Dr. Humphrey Rolls)  . Morbid obesity ([place])    Pre gastric bypass  . 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 Oligomenorrhea    on Implanon (pre gastric bypass)  . PONV (postoperative nausea and vomiting)    after 1st surgery  . Postpartum depression    post gastric bypass  . Pre-eclampsia    post gastric bypass  . Restless legs     Family History  Problem Relation Age of Onset  . Coronary artery disease Father        2 stents; + smoker  . Hypertension Father   . Thyroid disease Brother   . Diabetes Maternal Grandmother   . Cirrhosis Paternal Grandmother   . Alcohol abuse Paternal Grandmother   . Breast cancer Unknown        Paternal great aunt(obesity, smoker, EtOHic)  . Breast cancer Unknown        Maternal great aunt (deceased in 87's from same)  . Thyroid disease Other        cousins and grandmother  . Cancer Other        great aunt - lung  . Liver disease Paternal Aunt   . Cancer Cousin        liver, bile duct  . Cancer Mother        uterine    Past Surgical History:  Procedure Laterality Date  . CHOLECYSTECTOMY  08/2010  . 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 implanon placed  03/16/09   (3 years)  . ORIF ANKLE FRACTURE Left 02/2017   L lat malleolar fracture Theresa Pratt)  . ORIF ANKLE FRACTURE Left 02/21/2017   Procedure: OPEN REDUCTION INTERNAL FIXATION (ORIF) LEFT LATERAL MALLEOLUS ANKLE FRACTURE;  Surgeon: Theresa Koyanagi, MD;  Location: [facility];  Service: Orthopedics;  Laterality: Left;  . 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 Smoking status: Never Smoker  . Smokeless tobacco: Never Used  . Alcohol use Yes     Comment: occasional  . Drug use: No  . Sexual activity: Yes    Partners: Male    Birth control/ protection: None

## 2017-03-26 ENCOUNTER — Other Ambulatory Visit (INDEPENDENT_AMBULATORY_CARE_PROVIDER_SITE_OTHER): Payer: Self-pay | Admitting: Orthopaedic Surgery

## 2017-04-02 ENCOUNTER — Ambulatory Visit (INDEPENDENT_AMBULATORY_CARE_PROVIDER_SITE_OTHER): Payer: BC Managed Care – PPO | Admitting: Orthopaedic Surgery

## 2017-04-02 ENCOUNTER — Ambulatory Visit (INDEPENDENT_AMBULATORY_CARE_PROVIDER_SITE_OTHER): Payer: BC Managed Care – PPO

## 2017-04-02 ENCOUNTER — Encounter (INDEPENDENT_AMBULATORY_CARE_PROVIDER_SITE_OTHER): Payer: Self-pay | Admitting: Orthopaedic Surgery

## 2017-04-02 DIAGNOSIS — S8262XA Displaced fracture of lateral malleolus of left fibula, initial encounter for closed fracture: Secondary | ICD-10-CM | POA: Diagnosis not present

## 2017-04-02 NOTE — Progress Notes (Signed)
Patient is 6 weeks status post ORIF left  lateral malleolus fracture. She is doing well overall. She does admit to being noncompliant with nonweightbearing. Overall she denies any significant pain.   Physical exam shows fully healed surgical scar. Minimal swelling. Good range of motion of the ankle.   Two-view x-rays of the left ankle show stable fixation of the fracture with progressive bony consolidation and healing.   Patient is progressing well from my standpoint. I'll like to see her back in 6 weeks with repeat 3 view x-rays of the left ankle. Referral to physical therapy was made today. May go back to work.

## 2017-04-18 ENCOUNTER — Ambulatory Visit: Payer: BC Managed Care – PPO

## 2017-05-20 ENCOUNTER — Ambulatory Visit (INDEPENDENT_AMBULATORY_CARE_PROVIDER_SITE_OTHER): Payer: BC Managed Care – PPO | Admitting: Orthopaedic Surgery

## 2017-05-20 ENCOUNTER — Ambulatory Visit (INDEPENDENT_AMBULATORY_CARE_PROVIDER_SITE_OTHER): Payer: BC Managed Care – PPO

## 2017-05-20 ENCOUNTER — Encounter (INDEPENDENT_AMBULATORY_CARE_PROVIDER_SITE_OTHER): Payer: Self-pay | Admitting: Orthopaedic Surgery

## 2017-05-20 DIAGNOSIS — S8262XA Displaced fracture of lateral malleolus of left fibula, initial encounter for closed fracture: Secondary | ICD-10-CM | POA: Diagnosis not present

## 2017-05-20 NOTE — Progress Notes (Signed)
Theresa Pratt is 88 days status post ORIF left ankle fracture.  She is overall doing well.  She is back to work.  She has some mild swelling.  Overall she has minimal pain.  Ankle exam is benign.  Fully healed surgical scar.  X-rays show healed fibula fracture without any complications.  Patient is progressing very well with her rehab and recovery.  At this point she may increase activity as tolerated.  Questions encouraged and answered.  She has demonstrated that she has healed the fracture.  Follow-up as needed.

## 2017-06-08 ENCOUNTER — Other Ambulatory Visit: Payer: Self-pay

## 2017-06-08 ENCOUNTER — Ambulatory Visit (HOSPITAL_COMMUNITY)
Admission: EM | Admit: 2017-06-08 | Discharge: 2017-06-08 | Disposition: A | Discharge status: Home / Self Care | Payer: BC Managed Care – PPO | Attending: Internal Medicine | Admitting: Internal Medicine

## 2017-06-08 ENCOUNTER — Encounter (HOSPITAL_COMMUNITY): Payer: Self-pay | Admitting: *Deleted

## 2017-06-08 DIAGNOSIS — H6121 Impacted cerumen, right ear: Secondary | ICD-10-CM | POA: Diagnosis not present

## 2017-06-08 DIAGNOSIS — H9201 Otalgia, right ear: Secondary | ICD-10-CM

## 2017-06-08 HISTORY — DX: Other urticaria: L50.8

## 2017-06-08 MED ORDER — FLUTICASONE PROPIONATE 50 MCG/ACT NA SUSP: 2.0000 | Freq: Every day | NASAL | 0 refills | Status: DC

## 2017-06-08 MED ORDER — NEOMYCIN-POLYMYXIN-HC 3.5-10000-1 OT SUSP: 4.0000 [drp] | Freq: Four times a day (QID) | OTIC | 0 refills | Status: AC

## 2017-06-08 NOTE — Discharge Instructions (Signed)
Ear wax removal today, there is still some in the back that we were not able to remove. It may need to loosen a little bit before it will completely come out. Hydrogen peroxide can further loosen the ear wax as well. Cortisporin to prevent ear canal infection as per your preference. Flonase to help with eustachian tube dysfunction, continue allergy medicines. Follow up with PCP or here in 1 week if ear wax does not come out to attempt further removal of ear wax.

## 2017-06-08 NOTE — ED Triage Notes (Signed)
Started with decreased hearing in right ear x "weeks"; started with pain right ear into head 2 days ago.  Has had tactile fever with aches.  Has taken ASA.

## 2017-06-08 NOTE — ED Provider Notes (Signed)
[name]    CSN: [id number] Arrival date & time: 06/08/17  1406     History   Chief Complaint Chief Complaint  Patient presents with  . Otalgia    HPI Theresa Pratt is a 46 y.o. female.   46 year old female comes in for few week history of decreased hearing in the right ear. Started having right ear pain for the past 2 days. States burping/yawning makes the pain worse. Subjective fever with aches. Minimal nasal congestion/rhinorrhea. Denies other URI symptoms such as cough, sore throat. Taken ASA without relief. Has also tried some ear drops without relief. Occasional cotton swab use. Denies recent travels, swimming.       Past Medical History:  Diagnosis Date  . ADHD, adult residual type   . Allergic rhinitis    post gastric bypass  . Anxiety   . Arthropathy    weight bearing (pre gastric bypass)  . Chronic urticaria   . Diarrhea, functional   . DM (diabetes mellitus) ([place])    pre gastric bypass, pt states she has not been treated for it in years, her doctor doesn't discuss it with her anymore  . GERD (gastroesophageal reflux disease)    post gastric bypass  . Heart murmur    had it in the past  . HTN (hypertension)    Pre gastric bypass  . HTN (hypertension)    post gastric bypass  . IDA (iron deficiency anemia)    post gastric bypass (iron infusion by hematology-doesn't build stores -Dr. Humphrey Rolls)  . Morbid obesity ([place])    Pre gastric bypass  . 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 Oligomenorrhea    on Implanon (pre gastric bypass)  . PONV (postoperative nausea and vomiting)    after 1st surgery  . Postpartum depression    post gastric bypass  . Pre-eclampsia    post gastric bypass  . Restless legs     Patient Active Problem List   Diagnosis Date Noted  . History of open reduction and internal fixation (ORIF) procedure 02/21/2017  . Displaced fracture of lateral malleolus of left fibula, initial encounter for closed fracture  02/19/2017  . Sore throat 12/21/2016  . Vitamin B12 deficiency 12/21/2016  . Right foot pain 12/07/2016  . Inattention 05/29/2016  . Chronic urticaria 03/05/2016  . Adjustment disorder with anxiety 02/28/2016  . Anxiety 06/29/2015  . 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 Work-related stress 04/28/2014  . Menorrhagia 07/04/2012  . 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 Healthcare maintenance 06/17/2012  . BREAST MASS, LEFT 07/21/2010  . BACK PAIN, LUMBAR 07/14/2010  . Vitamin D deficiency 07/03/2010  . ALLERGIC RHINITIS 07/03/2010  . GERD 07/03/2010  . Bariatric surgery status 07/03/2010  . Essential hypertension 06/22/2010    Past Surgical History:  Procedure Laterality Date  . CHOLECYSTECTOMY  08/2010  . 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 ORIF ANKLE FRACTURE Left 02/2017   L lat malleolar fracture Erlinda Hong)  . ORIF ANKLE FRACTURE Left 02/21/2017   Procedure: OPEN REDUCTION INTERNAL FIXATION (ORIF) LEFT LATERAL MALLEOLUS ANKLE FRACTURE;  Surgeon: Leandrew Koyanagi, MD;  Location: [place];  Service: Orthopedics;  Laterality: Left;  . rectal condyloma removal  2006    OB History    Gravida Para Term Preterm AB Living   2 2 2     2    SAB TAB Ectopic Multiple Live Births  Home Medications    Prior to Admission medications   Medication Sig Start Date End Date Taking? Authorizing Provider  aspirin EC 325 MG tablet Take 1 tablet (325 mg total) by mouth 2 (two) times daily. 02/21/17  Yes Leandrew Koyanagi, MD  Cetirizine HCl 10 MG CAPS Take 10 mg by mouth daily 08/15/16  Yes [provider]  escitalopram (LEXAPRO) 5 MG tablet Take 1 tablet (5 mg total) by mouth daily. 12/21/16  Yes [name], MD  hydrochlorothiazide (HYDRODIURIL) 12.5 MG tablet Take 1 tablet (12.5 mg total) by mouth daily. 12/21/16  Yes [name], MD  hydrOXYzine (ATARAX/VISTARIL) 10 MG tablet Take 10 mg by mouth 3 (three) times daily as needed for itching (hives).   Yes [provider]  Iron-Vitamins (GERITOL PO) Take 1 tablet by mouth daily.   Yes [provider]  montelukast (SINGULAIR) 10 MG tablet Take 10 mg by mouth daily.  08/15/16  Yes [provider]  Multiple Vitamin (MULTIVITAMIN) tablet Take 1 tablet by mouth daily.     Yes [provider]  predniSONE (DELTASONE) 20 MG tablet Take 1 tablet (20 mg total) by mouth daily. Patient taking differently: Take 5 mg by mouth daily. Prn for allergies 03/09/17  Yes Conty, Linward Foster, MD  ranitidine (ZANTAC) 150 MG tablet Take 150 mg by mouth daily. Reported on 12/08/2015   Yes [provider]  vitamin B-12 (CYANOCOBALAMIN) 1000 MCG tablet Take 1 tablet (1,000 mcg total) by mouth daily. 12/21/16  Yes [name], MD  acetaminophen (TYLENOL) 500 MG tablet Take 500 mg by mouth daily as needed for moderate pain.     [provider]  Camphor-Menthol (TIGER BALM EXTRA STRENGTH EX) Apply 1 application topically daily as needed (pain).    [provider]  cholecalciferol (VITAMIN D) 1000 UNITS tablet Take 2,000 Units by mouth daily.    [provider]  cycloSPORINE modified (NEORAL) 50 MG capsule Take 50 mg by mouth daily as needed (hives).  08/15/16   [provider]  fluticasone (FLONASE) 50 MCG/ACT nasal spray Place 2 sprays into both nostrils daily. 06/08/17   Tasia Catchings, Llewellyn Choplin V, PA-C  ibuprofen (ADVIL,MOTRIN) 200 MG tablet Take 200 mg by mouth daily as needed for moderate pain.     [provider]  methocarbamol (ROBAXIN) 750 MG tablet TAKE 1 TABLET BY MOUTH TWICE A DAY AS NEEDED FOR MUSCLE SPAMS 03/26/17   Leandrew Koyanagi, MD  neomycin-polymyxin-hydrocortisone (CORTISPORIN) 3.5-10000-1 OTIC suspension Place 4 drops into the right ear 4 (four) times daily for 7 days. 06/08/17 06/15/17  Ok Edwards, PA-C    Family History Family History  Problem Relation Age of Onset  . Coronary artery disease Father        2 stents; + smoker  . Hypertension Father   .  Thyroid disease Brother   . Diabetes Maternal Grandmother   . Cirrhosis Paternal Grandmother   . Alcohol abuse Paternal Grandmother   . Breast cancer Unknown        Paternal great aunt(obesity, smoker, EtOHic)  . Breast cancer Unknown        Maternal great aunt (deceased in 39's from same)  . Thyroid disease Other        cousins and grandmother  . Cancer Other        great aunt - lung  . Liver disease Paternal Aunt   . Cancer Cousin        liver, bile duct  . Cancer Mother  uterine    Social History Social History   Tobacco Use  . Smoking status: Never Smoker  . Smokeless tobacco: Never Used  Substance Use Topics  . Alcohol use: Yes    Comment: occasional  . Drug use: No     Allergies   Sertraline and Sulfa antibiotics   Review of Systems Review of Systems  Reason unable to perform ROS: See HPI as above.     Physical Exam Triage Vital Signs ED Triage Vitals  Enc Vitals Group     BP 06/08/17 1503 (!) 145/92     Pulse Rate 06/08/17 1503 85     Resp 06/08/17 1503 16     Temp 06/08/17 1503 97.8 F (36.6 C)     Temp Source 06/08/17 1503 Oral     SpO2 06/08/17 1503 100 %     Weight --      Height --      Head Circumference --      Peak Flow --      Pain Score 06/08/17 1504 5     Pain Loc --      Pain Edu? --      Excl. in [place]? --    No data found.  Updated Vital Signs BP (!) 145/92   Pulse 85   Temp 97.8 F (36.6 C) (Oral)   Resp 16   LMP 05/27/2017 (Approximate)   SpO2 100%   Physical Exam  Constitutional: She is oriented to person, place, and time. She appears well-developed and well-nourished. No distress.  HENT:  Head: Normocephalic and atraumatic.  Left Ear: Tympanic membrane, external ear and ear canal normal. Tympanic membrane is not erythematous and not bulging.  Right ear with tenderness to palpation of tragus. Ear canal without swelling, erythema. Cerumen impaction, TM not visible.   Was able to irrigate copious cerumen,  however, continued cerumen in the ear canal.   Eyes: Conjunctivae are normal. Pupils are equal, round, and reactive to light.  Neck: Normal range of motion. Neck supple.  Pulmonary/Chest: Effort normal. No respiratory distress.  Neurological: She is alert and oriented to person, place, and time.     UC Treatments / Results  Labs (all labs ordered are listed, but only abnormal results are displayed) Labs Reviewed - No data to display  EKG  EKG Interpretation None       Radiology No results found.  Procedures Procedures (including critical care time)  Medications Ordered in UC Medications - No data to display   Initial Impression / Assessment and Plan / UC Course  I have reviewed the triage vital signs and the nursing notes.  Pertinent labs & imaging results that were available during my care of the patient were reviewed by me and considered in my medical decision making (see chart for details).     Was not able to completely irrigate the right ear. Will have patient continue ear wax removal and hydrogen peroxide for the next few days, follow up with PCP for reevaluation to attempt further removal. Discussed possible eustachian tube dysfunction as well, to start flonase. Patient requesting treatment for otitis externa, discussed ear pain more likely due to eustachian tube dysfunction or cerumen impaction, no signs of otitis externa. Patient would still like to attempt treatment due to ear pain, will fill cortisporin. Patient to follow up with PCP for reevaluation.   Final Clinical Impressions(s) / UC Diagnoses   Final diagnoses:  Right ear pain    ED Discharge Orders  Ordered    fluticasone (FLONASE) 50 MCG/ACT nasal spray  Daily     06/08/17 1703    neomycin-polymyxin-hydrocortisone (CORTISPORIN) 3.5-10000-1 OTIC suspension  4 times daily     06/08/17 1703       Ok Edwards, PA-C 06/08/17 2255

## 2017-10-16 ENCOUNTER — Ambulatory Visit (INDEPENDENT_AMBULATORY_CARE_PROVIDER_SITE_OTHER): Payer: BC Managed Care – PPO | Admitting: Psychology

## 2017-10-16 DIAGNOSIS — F4310 Post-traumatic stress disorder, unspecified: Secondary | ICD-10-CM | POA: Diagnosis not present

## 2017-10-16 DIAGNOSIS — F4322 Adjustment disorder with anxiety: Secondary | ICD-10-CM | POA: Diagnosis not present

## 2017-10-16 DIAGNOSIS — F902 Attention-deficit hyperactivity disorder, combined type: Secondary | ICD-10-CM

## 2017-10-30 ENCOUNTER — Encounter: Payer: Self-pay | Admitting: Family Medicine

## 2017-10-30 ENCOUNTER — Ambulatory Visit: Payer: BC Managed Care – PPO | Admitting: Family Medicine

## 2017-10-30 VITALS — BP 122/86 | HR 96 | Temp 97.9°F | Ht 65.0 in | Wt 253.5 lb

## 2017-10-30 DIAGNOSIS — F419 Anxiety disorder, unspecified: Secondary | ICD-10-CM | POA: Diagnosis not present

## 2017-10-30 DIAGNOSIS — F902 Attention-deficit hyperactivity disorder, combined type: Secondary | ICD-10-CM

## 2017-10-30 DIAGNOSIS — Z566 Other physical and mental strain related to work: Secondary | ICD-10-CM

## 2017-10-30 DIAGNOSIS — F4310 Post-traumatic stress disorder, unspecified: Secondary | ICD-10-CM

## 2017-10-30 MED ORDER — METHYLPHENIDATE HCL 5 MG PO TABS: 5.0000 mg | ORAL_TABLET | Freq: Every day | ORAL | 0 refills | Status: DC | PRN

## 2017-10-30 NOTE — Patient Instructions (Addendum)
Keep follow up with Dr Lurline Hare and employee counselor.  Let's start stimulant ritalin to use as needed once daily.  Sign controlled substance agreement program and UDS today.  Then we can take a break during the summer.  Return beginning of August to discuss treatment again (likely restart straterra a few weeks prior to classes starting).

## 2017-10-30 NOTE — Progress Notes (Signed)
BP 122/86 (BP Location: Left Arm, Patient Position: Sitting, Cuff Size: Large)   Pulse 96   Temp 97.9 F (36.6 C) (Oral)   Ht 5\' 5"  (1.651 m)   Wt 253 lb 8 oz (115 kg)   LMP 10/16/2017   SpO2 96%   BMI 42.18 kg/m    CC: discuss ADHD Subjective:    Patient ID: Theresa Pratt, female    DOB: [birth year], 47 y.o.   MRN: [id number]  HPI: Theresa Pratt is a 47 y.o. female presenting on 10/30/2017 for Discuss meds (Recently dx with ADHD. Told to see PCP.)   Recent psychological evaluation/testing (Altabet) with confirmed ADHD diagnosis. From Dr Altabet's staff note: Results were positive for ADHD - combined presentation along with Post Traumatic Stress Disorder. Recommendations include discussion of medication options for attention and anxiety with PCP along with psychotherapy to address previous trauma, emotion regulation, and organization skills.   Here to discuss further treatment.  PTSD - trauma as child.  Planning to see Dr Lurline Hare - to work on [employer] for ADHD Planning to see EAP through work for PTSD.   Off lexapro - was helpful but seemed to cause apathy/blunted emotions. Has not tolerated sertraline well in the past - may have contributed to hives. She's tried son's methylphenidate which has been helpful without side effects.   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 Has new job which has helped some but ongoing trouble with ADHD and anxiety at work.   Started taking CBD oil 1/2 dropper full twice a week which has helped pruritic hives - now off cyclosporine with minimal hydroxyzine use. She has even found CBD oil helps menstrual cramping.   Relevant past medical, surgical, family and social history reviewed and updated as indicated. Interim medical history since our last visit reviewed. Allergies and medications reviewed and updated. Outpatient Medications Prior to Visit  Medication Sig Dispense Refill  . CANNABIDIOL PO Take by mouth 2 (two)  times a week.    . cholecalciferol (VITAMIN D) 1000 UNITS tablet Take 2,000 Units by mouth daily.    . hydrochlorothiazide (HYDRODIURIL) 12.5 MG tablet Take 1 tablet (12.5 mg total) by mouth daily. 90 tablet 3  . Iron-Vitamins (GERITOL PO) Take 1 tablet by mouth daily.    . Multiple Vitamin (MULTIVITAMIN) tablet Take 1 tablet by mouth daily.      . ranitidine (ZANTAC) 150 MG tablet Take 150 mg by mouth daily. Reported on 12/08/2015    . hydrOXYzine (ATARAX/VISTARIL) 10 MG tablet Take 10 mg by mouth 3 (three) times daily as needed for itching (hives).    Marland Kitchen ibuprofen (ADVIL,MOTRIN) 200 MG tablet Take 200 mg by mouth daily as needed for moderate pain.     Marland Kitchen acetaminophen (TYLENOL) 500 MG tablet Take 500 mg by mouth daily as needed for moderate pain.     Marland Kitchen aspirin EC 325 MG tablet Take 1 tablet (325 mg total) by mouth 2 (two) times daily. 84 tablet 0  . Camphor-Menthol (TIGER BALM EXTRA STRENGTH EX) Apply 1 application topically daily as needed (pain).    . Cetirizine HCl 10 MG CAPS Take 10 mg by mouth daily    . cycloSPORINE modified (NEORAL) 50 MG capsule Take 50 mg by mouth daily as needed (hives).     Marland Kitchen escitalopram (LEXAPRO) 5 MG tablet Take 1 tablet (5 mg total) by mouth daily. 90 tablet 3  . fluticasone (FLONASE) 50 MCG/ACT nasal spray Place 2 sprays into both nostrils daily.  1 g 0  . methocarbamol (ROBAXIN) 750 MG tablet TAKE 1 TABLET BY MOUTH TWICE A DAY AS NEEDED FOR MUSCLE SPAMS 60 tablet 0  . montelukast (SINGULAIR) 10 MG tablet Take 10 mg by mouth daily.     . predniSONE (DELTASONE) 20 MG tablet Take 1 tablet (20 mg total) by mouth daily. (Patient taking differently: Take 5 mg by mouth daily. Prn for allergies) 7 tablet 0  . vitamin B-12 (CYANOCOBALAMIN) 1000 MCG tablet Take 1 tablet (1,000 mcg total) by mouth daily.     No facility-administered medications prior to visit.      Per HPI unless specifically indicated in ROS section below Review of Systems     Objective:    BP  122/86 (BP Location: Left Arm, Patient Position: Sitting, Cuff Size: Large)   Pulse 96   Temp 97.9 F (36.6 C) (Oral)   Ht 5\' 5"  (1.651 m)   Wt 253 lb 8 oz (115 kg)   LMP 10/16/2017   SpO2 96%   BMI 42.18 kg/m   Wt Readings from Last 3 Encounters:  10/30/17 253 lb 8 oz (115 kg)  03/09/17 254 lb (115.2 kg)  02/22/17 256 lb (116.1 kg)    Physical Exam  Constitutional: She appears well-developed and well-nourished. No distress.  Psychiatric: She has a normal mood and affect. Her speech is normal and behavior is normal. Judgment and thought content normal. Cognition and memory are normal.  Nursing note and vitals reviewed.  Results for orders placed or performed in visit on 10/30/17  Pain Mgmt, Profile 8 w/Conf, U  Result Value Ref Range   Prescribed Drug 1 Methylphenidate    Creatinine 228.0 > or = 20. mg/dL   pH 5.97 4.5 - 9.0   Oxidant NEGATIVE <200 mcg/mL   Amphetamines NEGATIVE <500 ng/mL   medMATCH Amphetamines CONSISTENT    Benzodiazepines NEGATIVE <100 ng/mL   medMATCH Benzodiazepines CONSISTENT    Marijuana Metabolite NEGATIVE CONFIRMED <20 ng/mL   Marijuana Metabolite NEGATIVE <5 ng/mL   medMATCH Marijuana Metab CONSISTENT    Cocaine Metabolite NEGATIVE <150 ng/mL   medMATCH Cocaine Metab CONSISTENT    Opiates NEGATIVE <100 ng/mL   medMATCH Opiates CONSISTENT    Oxycodone NEGATIVE <100 ng/mL   medMATCH Oxycodone CONSISTENT    Prescribed Drug 1 Methylphenidate    Buprenorphine, Urine NEGATIVE <5 ng/mL   medMATCH Buprenorphine CONSISTENT    Prescribed Drug 1 Methylphenidate    MDMA NEGATIVE <500 ng/mL   [facility] MDMA CONSISTENT    Prescribed Drug 1 Methylphenidate    Alcohol Metabolites NEGATIVE <500 ng/mL   [facility] Alcohol Metab CONSISTENT    Prescribed Drug 1 Methylphenidate    6 Acetylmorphine NEGATIVE <10 ng/mL   medMATCH 6 Acetylmorphine CONSISTENT       Assessment & Plan:  Over 25 minutes were spent face-to-face with the patient during this encounter  and >50% of that time was spent on counseling and coordination of care. See below.  Problem List Items Addressed This Visit    Anxiety    With component of PTSD - reviewed caution with stimulant possibly worsening anxiety. Anticipate will do well with low dose ritalin - she has tolerated this well in the past. She will establish with EAP counselor as well.       Attention deficit hyperactivity disorder (ADHD), combined type - Primary    Confirmed by psychological testing (Altabet). Reviewed pharmacotherapy treatment options as well as coping strategies to help manage. Discussed stimulant and non-stimulant options. Will  start low dose ritalin to use PRN inattention management over next several weeks as school year comes to a close, then likely stimulant holiday through summer months (less stress, ADHD better managed). Will return late summer to discuss long term management including straterra.  Discussed risks/benefits of stimulant and as it is a controlled substance will have patient fill out controlled substance agreement form and perform UDS today. Reviewed CBD oil use.       Relevant Orders   Pain Mgmt, Profile 8 w/Conf, U (Completed)   PTSD (post-traumatic stress disorder)    To establish with EAP counselor      Work-related stress       Meds ordered this encounter  Medications  . methylphenidate (RITALIN) 5 MG tablet    Sig: Take 1 tablet (5 mg total) by mouth daily as needed (ADHD).    Dispense:  30 tablet    Refill:  0   Orders Placed This Encounter  Procedures  . Pain Mgmt, Profile 8 w/Conf, U    Order Specific Question:   Prescribed drugs 1:    Answer:   METHYLPHENIDATE    Follow up plan: Return in about 3 months (around 01/29/2018) for follow up visit.  [name], MD

## 2017-10-31 ENCOUNTER — Telehealth: Payer: Self-pay

## 2017-10-31 NOTE — Telephone Encounter (Signed)
Received PA approval effective 10/31/2017- 10/31/2020.  Informed [facility] [place]

## 2017-10-31 NOTE — Telephone Encounter (Signed)
Started PA for Ritalin 5 mg tab, key:  [id number], PA case ID:  [id number], Rx #:  [id number]. Decision pending.

## 2017-11-01 LAB — PAIN MGMT, PROFILE 8 W/CONF, U
6 ACETYLMORPHINE: NEGATIVE ng/mL (ref <10)
ALCOHOL METABOLITES: NEGATIVE ng/mL (ref <500)
Amphetamines: NEGATIVE ng/mL (ref <500)
BUPRENORPHINE, URINE: NEGATIVE ng/mL (ref <5)
Benzodiazepines: NEGATIVE ng/mL (ref <100)
Cocaine Metabolite: NEGATIVE ng/mL (ref <150)
Creatinine: 228 mg/dL
MARIJUANA METABOLITE: NEGATIVE ng/mL (ref <20)
MDMA: NEGATIVE ng/mL (ref <500)
Marijuana Metabolite: NEGATIVE ng/mL (ref <5)
OXYCODONE: NEGATIVE ng/mL (ref <100)
Opiates: NEGATIVE ng/mL (ref <100)
Oxidant: NEGATIVE ug/mL (ref <200)
pH: 5.97 (ref 4.5–9.0)

## 2017-11-02 NOTE — Assessment & Plan Note (Signed)
With component of PTSD - reviewed caution with stimulant possibly worsening anxiety. Anticipate will do well with low dose ritalin - she has tolerated this well in the past. She will establish with EAP counselor as well.

## 2017-11-02 NOTE — Assessment & Plan Note (Signed)
To establish with EAP counselor

## 2017-11-02 NOTE — Assessment & Plan Note (Signed)
Confirmed by psychological testing (Altabet). Reviewed pharmacotherapy treatment options as well as coping strategies to help manage. Discussed stimulant and non-stimulant options. Will start low dose ritalin to use PRN inattention management over next several weeks as school year comes to a close, then likely stimulant holiday through summer months (less stress, ADHD better managed). Will return late summer to discuss long term management including straterra.  Discussed risks/benefits of stimulant and as it is a controlled substance will have patient fill out controlled substance agreement form and perform UDS today. Reviewed CBD oil use.

## 2017-11-22 ENCOUNTER — Ambulatory Visit: Payer: Self-pay | Admitting: Psychology

## 2017-12-11 ENCOUNTER — Ambulatory Visit: Payer: BC Managed Care – PPO | Admitting: Psychology

## 2018-01-09 ENCOUNTER — Other Ambulatory Visit: Payer: Self-pay | Admitting: Family Medicine

## 2018-01-09 NOTE — Telephone Encounter (Signed)
Methylphenidate refill Last Refill:10/30/17 #30 Last OV: 10/30/17 PCP: Dr. Danise Mina [facility]

## 2018-01-09 NOTE — Telephone Encounter (Signed)
Copied from [place] [id number]. Topic: Quick Communication - See Telephone Encounter >> Jan 09, 2018 10:28 AM Mylinda Latina, NT wrote: CRM for notification. See Telephone encounter for: 01/09/18. Patient called and states that she needs a refill of her methylphenidate (RITALIN) 5 MG tablet. Patient does have an upcoming appt on 01/14/18 for a med refill appt   [id number], [place] [phone] (Phone) [phone] (Fax)

## 2018-01-10 MED ORDER — METHYLPHENIDATE HCL 5 MG PO TABS: 5.0000 mg | ORAL_TABLET | Freq: Every day | ORAL | 0 refills | Status: DC | PRN

## 2018-01-10 NOTE — Telephone Encounter (Signed)
D prescribed

## 2018-01-10 NOTE — Telephone Encounter (Signed)
Name of Medication: Methylphenidate 5 mg Name of Pharmacy: [facility] [place] or Written Date and Quantity: # 30 on 10/30/17  Last Office Visit and Type: 10/30/17 Next Office Visit and Type: 01/14/18 for med refill Last Controlled Substance Agreement Date: 11/04/17 Last UDS:10/30/17

## 2018-01-14 ENCOUNTER — Ambulatory Visit: Payer: BC Managed Care – PPO | Admitting: Family Medicine

## 2018-01-14 DIAGNOSIS — Z0289 Encounter for other administrative examinations: Secondary | ICD-10-CM

## 2018-01-17 ENCOUNTER — Ambulatory Visit (INDEPENDENT_AMBULATORY_CARE_PROVIDER_SITE_OTHER): Payer: BC Managed Care – PPO | Admitting: Psychology

## 2018-01-17 DIAGNOSIS — F902 Attention-deficit hyperactivity disorder, combined type: Secondary | ICD-10-CM

## 2018-01-17 DIAGNOSIS — F4310 Post-traumatic stress disorder, unspecified: Secondary | ICD-10-CM

## 2018-02-26 ENCOUNTER — Telehealth: Payer: Self-pay | Admitting: *Deleted

## 2018-02-26 ENCOUNTER — Ambulatory Visit (INDEPENDENT_AMBULATORY_CARE_PROVIDER_SITE_OTHER): Payer: BC Managed Care – PPO | Admitting: Psychology

## 2018-02-26 DIAGNOSIS — F902 Attention-deficit hyperactivity disorder, combined type: Secondary | ICD-10-CM

## 2018-02-26 DIAGNOSIS — F4310 Post-traumatic stress disorder, unspecified: Secondary | ICD-10-CM | POA: Diagnosis not present

## 2018-02-26 NOTE — Telephone Encounter (Signed)
Copied from [place] [id number]. Topic: General - Other >> Feb 26, 2018 10:25 AM Carolyn Stare wrote:  Pt said she is having some mental health issues and is asking if she can be sooner than 03/05/18 if so please contact pt    [phone]

## 2018-02-27 NOTE — Telephone Encounter (Signed)
Please schedule in open spot

## 2018-02-27 NOTE — Telephone Encounter (Signed)
Appointment 8/16 pt aware

## 2018-02-28 ENCOUNTER — Ambulatory Visit: Payer: BC Managed Care – PPO | Admitting: Family Medicine

## 2018-02-28 ENCOUNTER — Encounter: Payer: Self-pay | Admitting: Family Medicine

## 2018-02-28 VITALS — BP 126/78 | HR 100 | Temp 98.2°F | Ht 65.0 in | Wt 244.8 lb

## 2018-02-28 DIAGNOSIS — F902 Attention-deficit hyperactivity disorder, combined type: Secondary | ICD-10-CM

## 2018-02-28 DIAGNOSIS — F4310 Post-traumatic stress disorder, unspecified: Secondary | ICD-10-CM | POA: Diagnosis not present

## 2018-02-28 DIAGNOSIS — Z566 Other physical and mental strain related to work: Secondary | ICD-10-CM

## 2018-02-28 MED ORDER — METHYLPHENIDATE HCL ER (OSM) 18 MG PO TBCR: 18.0000 mg | EXTENDED_RELEASE_TABLET | Freq: Every day | ORAL | 0 refills | Status: DC

## 2018-02-28 NOTE — Assessment & Plan Note (Signed)
Ongoing stressors. Large portion of visit spent discussing this. Support provided. Encouraged efforts to salvage current work situation by making healthy changes, establishing boundaries and desired outcomes. Reviewed if she feels she remains in a toxic environment, best long-term strategy is likely transition to a healthier work environment. She is considering all options.

## 2018-02-28 NOTE — Progress Notes (Signed)
BP 126/78 (BP Location: Left Arm, Patient Position: Sitting, Cuff Size: Large)   Pulse 100   Temp 98.2 F (36.8 C) (Oral)   Ht 5\' 5"  (1.651 m)   Wt 244 lb 12 oz (111 kg)   LMP 02/22/2018   SpO2 98%   BMI 40.73 kg/m    CC: ADHD f/u Subjective:    Patient ID: Theresa Pratt, female    DOB: [birth year], 47 y.o.   MRN: [id number]  HPI: Theresa Pratt is a 47 y.o. female presenting on 02/28/2018 for ADHD (Had episode on 02/25/18 at work. Says coworkers had an issue with the way she responded with a situation. Had to leave work to go see the therapist. Pt had not taken her Ritalin that day. Wants to discuss balancing med.)   Had psychological evaluation early 2019 which confirmed ADHD diagnosis along with PTSD. Recommendation was psychotherapy and medication treatment for attention and anxiety. She has not seen [facility] for PTSD but sees Dr Lurline Hare for ADHD counseling.   Had disagreement at work on Monday, felt disrespected - had to talk to department administrator. This increased stress/anxiety - felt PTSD/anxiety attack - inner tremors, unable to fall asleep Monday night, racing heart. She saw Dr Lurline Hare Wednesday and he wrote her out of work Thursday through Monday.   Looking into new job.   ADHD - had been off ritalin for several days. Tolerates well - no headache, insomnia, appetite ok.   Relevant past medical, surgical, family and social history reviewed and updated as indicated. Interim medical history since our last visit reviewed. Allergies and medications reviewed and updated. Outpatient Medications Prior to Visit  Medication Sig Dispense Refill  . CANNABIDIOL PO Take by mouth 2 (two) times a week.    . cholecalciferol (VITAMIN D) 1000 UNITS tablet Take 2,000 Units by mouth daily.    . hydrochlorothiazide (HYDRODIURIL) 12.5 MG tablet Take 1 tablet (12.5 mg total) by mouth daily. 90 tablet 3  . hydrOXYzine (ATARAX/VISTARIL) 10 MG tablet Take 10 mg by mouth 3 (three) times  daily as needed for itching (hives).    Marland Kitchen ibuprofen (ADVIL,MOTRIN) 200 MG tablet Take 200 mg by mouth daily as needed for moderate pain.     . Iron-Vitamins (GERITOL PO) Take 1 tablet by mouth daily.    . Multiple Vitamin (MULTIVITAMIN) tablet Take 1 tablet by mouth daily.      . ranitidine (ZANTAC) 150 MG tablet Take 150 mg by mouth daily. Reported on 12/08/2015    . methylphenidate (RITALIN) 5 MG tablet Take 1 tablet (5 mg total) by mouth daily as needed (ADHD). 30 tablet 0   No facility-administered medications prior to visit.      Per HPI unless specifically indicated in ROS section below Review of Systems     Objective:    BP 126/78 (BP Location: Left Arm, Patient Position: Sitting, Cuff Size: Large)   Pulse 100   Temp 98.2 F (36.8 C) (Oral)   Ht 5\' 5"  (1.651 m)   Wt 244 lb 12 oz (111 kg)   LMP 02/22/2018   SpO2 98%   BMI 40.73 kg/m   Wt Readings from Last 3 Encounters:  02/28/18 244 lb 12 oz (111 kg)  10/30/17 253 lb 8 oz (115 kg)  03/09/17 254 lb (115.2 kg)    Physical Exam  Constitutional: She appears well-developed and well-nourished. No distress.  Psychiatric: She has a normal mood and affect. Her behavior is normal. Judgment and thought content normal.  Nursing note and vitals reviewed.      Assessment & Plan:   Problem List Items Addressed This Visit    Work-related stress    Ongoing stressors. Large portion of visit spent discussing this. Support provided. Encouraged efforts to salvage current work situation by making healthy changes, establishing boundaries and desired outcomes. Reviewed if she feels she remains in a toxic environment, best long-term strategy is likely transition to a healthier work environment. She is considering all options.       PTSD (post-traumatic stress disorder)    Dx earlier this year by psychological evaluation.  Again encouraged she establish with local counselor - she will look through [facility].  Discussed daily medication for PTSD  as an option - recommended start with counseling which is first line treatment for this.       Attention deficit hyperactivity disorder (ADHD), combined type    On low dose ritalin - has been using PRN. Anticipate not attaining desired degree of benefit from this due to low dose (but she wanted to start low dose). Recommended extended release methylphenidate - will send concerta 18mg  to price out and recommended regular use during school days, consider weekend holidays.  Recommended continue working with Dr Lurline Hare for ADHD counseling.           Meds ordered this encounter  Medications  . methylphenidate (CONCERTA) 18 MG PO CR tablet    Sig: Take 1 tablet (18 mg total) by mouth daily.    Dispense:  30 tablet    Refill:  0   No orders of the defined types were placed in this encounter.   Follow up plan: Return in about 3 months (around 05/31/2018) for follow up visit.  [name], MD

## 2018-02-28 NOTE — Assessment & Plan Note (Addendum)
On low dose ritalin - has been using PRN. Anticipate not attaining desired degree of benefit from this due to low dose (but she wanted to start low dose). Recommended extended release methylphenidate - will send concerta 18mg  to price out and recommended regular use during school days, consider weekend holidays.  Recommended continue working with Dr Lurline Hare for ADHD counseling.

## 2018-02-28 NOTE — Patient Instructions (Addendum)
Good to see you today. Let's start concerta (long acting ritalin) 18mg  daily.  Look into specific counseling for PTSD.  Let me know if interested in daily medication to help with PTSD.

## 2018-02-28 NOTE — Assessment & Plan Note (Addendum)
Dx earlier this year by psychological evaluation.  Again encouraged she establish with local counselor - she will look through [facility].  Discussed daily medication for PTSD as an option - recommended start with counseling which is first line treatment for this.

## 2018-03-05 ENCOUNTER — Telehealth: Payer: Self-pay | Admitting: Family Medicine

## 2018-03-05 ENCOUNTER — Ambulatory Visit: Payer: Self-pay | Admitting: Family Medicine

## 2018-03-05 MED ORDER — METHYLPHENIDATE HCL 10 MG PO TABS: 10.0000 mg | ORAL_TABLET | Freq: Every day | ORAL | 0 refills | Status: DC | PRN

## 2018-03-05 NOTE — Telephone Encounter (Signed)
Copied from [place] [id number]. Topic: General - Other >> Mar 05, 2018  2:50 PM Cecelia Byars, NT wrote: Reason for CRM: Patient called and said the new medication that was sent in on the 02/28/18 has not been approved ,and she  would like the previous medication refilled

## 2018-03-05 NOTE — Telephone Encounter (Signed)
plz touch base with patient - what does she mean by concerta "wasn't approved", was it not covered by insurance, do we need to do PA for this medication? It also comes in ritalin [place] (extended release form) if it's cheaper than concerta.  While we work on [place] (if needed), I have sent in prior medicine ritalin at higher dose to take 10mg  daily as needed, try to regularly take one in the mornings prior to work, ok to hold on weekends.

## 2018-03-05 NOTE — Telephone Encounter (Signed)
Med on 02/18/18 was methylphenidate(Concerta).Please advise.

## 2018-03-05 NOTE — Telephone Encounter (Signed)
Left message on vm per dpr relaying Dr. Synthia Innocent message and instructions. Asked pt to call back with clarification about the Concerta "not approved".

## 2018-03-06 ENCOUNTER — Telehealth: Payer: Self-pay

## 2018-03-06 NOTE — Telephone Encounter (Signed)
Copied from [place] [id number]. Topic: General - Other >> Mar 06, 2018  4:12 PM Keene Breath wrote: Reason for CRM: Patient called to clarify to doctor what the insurance company is asking for on her medications.  They do need a PA for both the methylphenidate (CONCERTA) 18 MG PO CR tablet and the methylphenidate (RITALIN) 10 MG tablet.  Patient also stated that she only has 1 pill left and would like if the doctor can send 5 pills like before so it will cover her until the prescription is filled.  Please advise.  CB# [phone].

## 2018-03-07 MED ORDER — METHYLPHENIDATE HCL 5 MG PO TABS
5.0000 mg | ORAL_TABLET | Freq: Every day | ORAL | 0 refills | Status: DC
Start: 2018-03-07 — End: 2018-06-03

## 2018-03-07 NOTE — Telephone Encounter (Signed)
Pt is requesting that Dr Darnell Level send in a few of the methylphenidate (RITALIN) 5 MG tablet (which was her original rx) bc her pharmacy is still stating 10 mg tab are also requiring some kind of approval.  She also states she is completely out of medication.

## 2018-03-07 NOTE — Telephone Encounter (Signed)
Sent in ritalin 5mg  #5 tablets.

## 2018-03-07 NOTE — Telephone Encounter (Signed)
Started PA for Ritalin 10 mg tab, key:  [id number], Rx #:  [id number].  And PA for Concerta 18 mg ER tab, key:  [id number], Rx #: [id number]. Decision pending for both.

## 2018-03-07 NOTE — Telephone Encounter (Signed)
See TE, 03/06/18 for pt's response.

## 2018-03-07 NOTE — Telephone Encounter (Signed)
Pt calling back to follow up. Please advise

## 2018-03-13 NOTE — Telephone Encounter (Signed)
Pt called and stated that she has not been approved yet and would like to know if she can get 10 pills.

## 2018-03-13 NOTE — Telephone Encounter (Signed)
Spoke with [facility] to ask about PA for both meds.  Did PA via phn, both approved for 36 mos. Told approvals will be faxed to [place].  Made [facility] aware of approvals.  Randall Hiss says he will put both rxs through and get ready for pt.   Spoke with pt informing her of approvals and rxs being prepared. Expresses her thanks.

## 2018-03-17 ENCOUNTER — Encounter: Payer: Self-pay | Admitting: Family Medicine

## 2018-04-08 ENCOUNTER — Ambulatory Visit (INDEPENDENT_AMBULATORY_CARE_PROVIDER_SITE_OTHER): Payer: BC Managed Care – PPO | Admitting: Psychology

## 2018-04-08 DIAGNOSIS — F4310 Post-traumatic stress disorder, unspecified: Secondary | ICD-10-CM

## 2018-04-08 DIAGNOSIS — F900 Attention-deficit hyperactivity disorder, predominantly inattentive type: Secondary | ICD-10-CM | POA: Diagnosis not present

## 2018-04-17 ENCOUNTER — Ambulatory Visit: Payer: Self-pay | Admitting: Obstetrics & Gynecology

## 2018-04-29 ENCOUNTER — Encounter: Payer: Self-pay | Admitting: Family Medicine

## 2018-04-29 ENCOUNTER — Other Ambulatory Visit: Payer: Self-pay | Admitting: Family Medicine

## 2018-04-29 ENCOUNTER — Ambulatory Visit: Payer: Self-pay | Admitting: Family Medicine

## 2018-04-29 NOTE — Progress Notes (Signed)
Patient did not keep appointment today. She will be called to reschedule.  

## 2018-04-29 NOTE — Telephone Encounter (Signed)
Copied from [place] [id number]. Topic: Quick Communication - Rx Refill/Question >> Apr 29, 2018  4:51 PM Bea Graff, NT wrote: Medication: methylphenidate (CONCERTA) 18 MG PO CR tablet  Has the patient contacted their pharmacy? Yes.   (Agent: If no, request that the patient contact the pharmacy for the refill.) (Agent: If yes, when and what did the pharmacy advise?)  Preferred Pharmacy (with phone number or street name): [id number] [place], [place] [phone] (Phone) [phone] (Fax)    Agent: Please be advised that RX refills may take up to 3 business days. We ask that you follow-up with your pharmacy.

## 2018-04-29 NOTE — Telephone Encounter (Signed)
Name of Medication: concerta 18 mg Name of [facility]acy[facility] or Written Date and Quantity: # 30 on 02/28/18 Last Office Visit and Type: 02/28/18 ADHD Next Office Visit and Type: 06/03/18 3 mth FU Last Controlled Substance Agreement Date: 11/04/17 Last UDS:10/30/17

## 2018-05-01 MED ORDER — METHYLPHENIDATE HCL ER (OSM) 18 MG PO TBCR: 18.0000 mg | EXTENDED_RELEASE_TABLET | Freq: Every day | ORAL | 0 refills | Status: DC

## 2018-05-01 NOTE — Telephone Encounter (Signed)
Eprescribed.

## 2018-06-03 ENCOUNTER — Ambulatory Visit: Payer: BC Managed Care – PPO | Admitting: Family Medicine

## 2018-06-03 ENCOUNTER — Encounter: Payer: Self-pay | Admitting: Family Medicine

## 2018-06-03 VITALS — BP 122/80 | HR 103 | Temp 97.8°F | Ht 65.0 in | Wt 233.8 lb

## 2018-06-03 DIAGNOSIS — Z566 Other physical and mental strain related to work: Secondary | ICD-10-CM | POA: Diagnosis not present

## 2018-06-03 DIAGNOSIS — F902 Attention-deficit hyperactivity disorder, combined type: Secondary | ICD-10-CM | POA: Diagnosis not present

## 2018-06-03 DIAGNOSIS — E669 Obesity, unspecified: Secondary | ICD-10-CM | POA: Diagnosis not present

## 2018-06-03 DIAGNOSIS — F4310 Post-traumatic stress disorder, unspecified: Secondary | ICD-10-CM | POA: Diagnosis not present

## 2018-06-03 MED ORDER — METHYLPHENIDATE HCL ER (OSM) 18 MG PO TBCR: 18.0000 mg | EXTENDED_RELEASE_TABLET | Freq: Every day | ORAL | 0 refills | Status: DC

## 2018-06-03 NOTE — Assessment & Plan Note (Signed)
Has done well on concerta 18mg  daily - affordable, tolerating well. Will continue current regimen. Will monitor heart rate on stimulant med.

## 2018-06-03 NOTE — Patient Instructions (Addendum)
You are doing well! Continue concerta 18mg  daily.  Check into counseling at work or through our office #[phone].  Return in 3 months for physical.

## 2018-06-03 NOTE — Progress Notes (Signed)
BP 122/80 (BP Location: Left Arm, Patient Position: Sitting, Cuff Size: Large)   Pulse (!) 103   Temp 97.8 F (36.6 C) (Oral)   Ht 5\' 5"  (1.651 m)   Wt 233 lb 12 oz (106 kg)   SpO2 98%   BMI 38.90 kg/m    CC: 3 mo f/u visit Subjective:    Patient ID: Theresa Pratt, female    DOB: [birth year], 47 y.o.   MRN: [id number]  HPI: Theresa Pratt is a 47 y.o. female presenting on 06/03/2018 for ADHD (Here for 3 mo f/u.)   See prior note for details.  S/p psychological evaluation + PTSD.   Currently on concerta 18mg  daily.  Denies denies headaches, chest pain, insomnia, appetite stable. Occasional palpitations endorsed  Currently taking 3 college level human resource classes and doing remarkably well, feels doing well at job as well as with responsibilities at home.   Noting weight loss - decreased appetite has helped, also working on healthy diet changes and regular exercise. [name] started [employer].   Relevant past medical, surgical, family and social history reviewed and updated as indicated. Interim medical history since our last visit reviewed. Allergies and medications reviewed and updated. Outpatient Medications Prior to Visit  Medication Sig Dispense Refill  . CANNABIDIOL PO Take by mouth 2 (two) times a week.    . cholecalciferol (VITAMIN D) 1000 UNITS tablet Take 2,000 Units by mouth daily.    . hydrochlorothiazide (HYDRODIURIL) 12.5 MG tablet Take 1 tablet (12.5 mg total) by mouth daily. 90 tablet 3  . hydrOXYzine (ATARAX/VISTARIL) 10 MG tablet Take 10 mg by mouth 3 (three) times daily as needed for itching (hives).    Marland Kitchen ibuprofen (ADVIL,MOTRIN) 200 MG tablet Take 200 mg by mouth daily as needed for moderate pain.     . Iron-Vitamins (GERITOL PO) Take 1 tablet by mouth daily.    . Multiple Vitamin (MULTIVITAMIN) tablet Take 1 tablet by mouth daily.      . ranitidine (ZANTAC) 150 MG tablet Take 150 mg by mouth daily. Reported on 12/08/2015    .  methylphenidate (CONCERTA) 18 MG PO CR tablet Take 1 tablet (18 mg total) by mouth daily. 30 tablet 0  . methylphenidate (RITALIN) 10 MG tablet Take 1 tablet (10 mg total) by mouth daily as needed (attention). 30 tablet 0  . methylphenidate (RITALIN) 5 MG tablet Take 1 tablet (5 mg total) by mouth daily. 5 tablet 0   No facility-administered medications prior to visit.      Per HPI unless specifically indicated in ROS section below Review of Systems     Objective:    BP 122/80 (BP Location: Left Arm, Patient Position: Sitting, Cuff Size: Large)   Pulse (!) 103   Temp 97.8 F (36.6 C) (Oral)   Ht 5\' 5"  (1.651 m)   Wt 233 lb 12 oz (106 kg)   SpO2 98%   BMI 38.90 kg/m   Wt Readings from Last 3 Encounters:  06/03/18 233 lb 12 oz (106 kg)  02/28/18 244 lb 12 oz (111 kg)  10/30/17 253 lb 8 oz (115 kg)    Physical Exam  Constitutional: She appears well-developed and well-nourished. No distress.  Cardiovascular: Regular rhythm and normal heart sounds. Bradycardia present.  No murmur heard. Pulmonary/Chest: Effort normal and breath sounds normal. No respiratory distress. She has no wheezes. She has no rales.  Musculoskeletal: She exhibits no edema.  Psychiatric: She has a normal mood and affect.  Nursing  note and vitals reviewed.     Assessment & Plan:   Problem List Items Addressed This Visit    Work-related stress    Ongoing, she feels she better manages this with stimulant on board.       PTSD (post-traumatic stress disorder)    Diagnosed earlier this year by psychological evaluation. Again encouraged she establish with local counselor, # provided for [facility] behavioral medicine counselors. She thinks she may see one of our [facility] counselors.  rec counseling prior to medication.       Obesity, Class II, BMI 35-39.9, no comorbidity    Congratulated on weight loss to date with addition of stimulant and healthy diet and lifestyle changes.       Relevant Medications    methylphenidate (CONCERTA) 18 MG PO CR tablet   Attention deficit hyperactivity disorder (ADHD), combined type - Primary    Has done well on concerta 18mg  daily - affordable, tolerating well. Will continue current regimen. Will monitor heart rate on stimulant med.           Meds ordered this encounter  Medications  . methylphenidate (CONCERTA) 18 MG PO CR tablet    Sig: Take 1 tablet (18 mg total) by mouth daily.    Dispense:  30 tablet    Refill:  0   No orders of the defined types were placed in this encounter.   Follow up plan: Return in about 3 months (around 09/03/2018) for annual exam, prior fasting for blood work.  [name], MD

## 2018-06-03 NOTE — Assessment & Plan Note (Signed)
Ongoing, she feels she better manages this with stimulant on board.

## 2018-06-03 NOTE — Assessment & Plan Note (Signed)
Congratulated on weight loss to date with addition of stimulant and healthy diet and lifestyle changes.

## 2018-06-03 NOTE — Assessment & Plan Note (Addendum)
Diagnosed earlier this year by psychological evaluation. Again encouraged she establish with local counselor, # provided for [facility] behavioral medicine counselors. She thinks she may see one of our [facility] counselors.  rec counseling prior to medication.

## 2018-06-17 ENCOUNTER — Ambulatory Visit (INDEPENDENT_AMBULATORY_CARE_PROVIDER_SITE_OTHER): Payer: BC Managed Care – PPO | Admitting: Psychology

## 2018-06-17 DIAGNOSIS — F902 Attention-deficit hyperactivity disorder, combined type: Secondary | ICD-10-CM

## 2018-06-17 DIAGNOSIS — F4310 Post-traumatic stress disorder, unspecified: Secondary | ICD-10-CM

## 2018-06-19 ENCOUNTER — Ambulatory Visit: Payer: BC Managed Care – PPO | Admitting: Psychology

## 2018-06-20 ENCOUNTER — Ambulatory Visit (INDEPENDENT_AMBULATORY_CARE_PROVIDER_SITE_OTHER): Payer: BC Managed Care – PPO | Admitting: Psychology

## 2018-06-20 DIAGNOSIS — F4310 Post-traumatic stress disorder, unspecified: Secondary | ICD-10-CM

## 2018-07-02 ENCOUNTER — Ambulatory Visit (INDEPENDENT_AMBULATORY_CARE_PROVIDER_SITE_OTHER): Payer: BC Managed Care – PPO | Admitting: Psychology

## 2018-07-02 DIAGNOSIS — F4310 Post-traumatic stress disorder, unspecified: Secondary | ICD-10-CM | POA: Diagnosis not present

## 2018-07-21 ENCOUNTER — Ambulatory Visit: Payer: BC Managed Care – PPO | Admitting: Psychology

## 2018-07-22 ENCOUNTER — Ambulatory Visit: Payer: Self-pay | Admitting: Psychology

## 2018-07-24 ENCOUNTER — Other Ambulatory Visit: Payer: Self-pay | Admitting: *Deleted

## 2018-07-24 NOTE — Telephone Encounter (Signed)
Name of Medication: Concerta Name of [facility] Last Fill or Written Date and Quantity: 06/03/18, #30/0 Last Office Visit and Type: 06/03/18, f/u Next Office Visit and Type: none Last Controlled Substance Agreement Date: 10/30/17 Last UDS: 10/30/17

## 2018-07-25 MED ORDER — METHYLPHENIDATE HCL ER (OSM) 18 MG PO TBCR: 18.0000 mg | EXTENDED_RELEASE_TABLET | Freq: Every day | ORAL | 0 refills | Status: DC

## 2018-07-25 NOTE — Telephone Encounter (Signed)
Eprescribed.

## 2018-08-04 ENCOUNTER — Ambulatory Visit (INDEPENDENT_AMBULATORY_CARE_PROVIDER_SITE_OTHER): Payer: BC Managed Care – PPO | Admitting: Psychology

## 2018-08-04 DIAGNOSIS — F4310 Post-traumatic stress disorder, unspecified: Secondary | ICD-10-CM | POA: Diagnosis not present

## 2018-08-11 ENCOUNTER — Ambulatory Visit: Payer: Self-pay | Admitting: Psychology

## 2018-08-19 ENCOUNTER — Ambulatory Visit: Payer: BC Managed Care – PPO | Admitting: Psychology

## 2018-08-29 ENCOUNTER — Other Ambulatory Visit: Payer: Self-pay

## 2018-08-29 MED ORDER — METHYLPHENIDATE HCL ER (OSM) 18 MG PO TBCR: 18.0000 mg | EXTENDED_RELEASE_TABLET | Freq: Every day | ORAL | 0 refills | Status: DC

## 2018-08-29 NOTE — Telephone Encounter (Signed)
Name of Medication: methylphenidate 18 mg CR Name of [facility] [address] or Written Date and Quantity: # 30 on 07/25/18 Last Office Visit and Type: 05/26/18 ADHD Next Office Visit and Type:none scheduled  Last Controlled Substance Agreement Date: 11/04/17 Last UDS:10/30/17  Pt has one pill left and request refill done today.

## 2018-08-29 NOTE — Telephone Encounter (Signed)
Sent. Thanks.   

## 2018-08-31 ENCOUNTER — Other Ambulatory Visit: Payer: Self-pay | Admitting: Family Medicine

## 2018-08-31 DIAGNOSIS — I10 Essential (primary) hypertension: Secondary | ICD-10-CM

## 2018-09-16 ENCOUNTER — Ambulatory Visit: Payer: BC Managed Care – PPO | Admitting: Family Medicine

## 2018-09-16 ENCOUNTER — Encounter: Payer: Self-pay | Admitting: Family Medicine

## 2018-09-16 VITALS — BP 138/84 | HR 105 | Temp 98.2°F | Ht 65.0 in | Wt 226.1 lb

## 2018-09-16 DIAGNOSIS — F4310 Post-traumatic stress disorder, unspecified: Secondary | ICD-10-CM

## 2018-09-16 DIAGNOSIS — F902 Attention-deficit hyperactivity disorder, combined type: Secondary | ICD-10-CM

## 2018-09-16 DIAGNOSIS — F419 Anxiety disorder, unspecified: Secondary | ICD-10-CM | POA: Diagnosis not present

## 2018-09-16 MED ORDER — LORAZEPAM 0.5 MG PO TABS: 0.5000 mg | ORAL_TABLET | Freq: Two times a day (BID) | ORAL | 0 refills | Status: DC | PRN

## 2018-09-16 NOTE — Patient Instructions (Addendum)
Continue concerta 18mg  daily.  Trial lorazepam 0.5mg  as needed for anxiety.  Continue seeing Rodena Piety for counseling.  Schedule physical at your convenience.

## 2018-09-16 NOTE — Progress Notes (Signed)
BP 138/84 (BP Location: Left Arm, Patient Position: Sitting, Cuff Size: Large)   Pulse (!) 105   Temp 98.2 F (36.8 C) (Oral)   Ht 5\' 5"  (1.651 m)   Wt 226 lb 2 oz (102.6 kg)   LMP 09/01/2018   SpO2 98%   BMI 37.63 kg/m    CC: f/u visit Subjective:    Patient ID: [name], female    DOB: 22-Nov-1970, 48 y.o.   MRN: [id number]  HPI: LIZANDRA ZAKRZEWSKI is a 48 y.o. female presenting on 09/16/2018 for Discuss Medication (Wants to discuss Concerta. )   See prior notes for details.  Difficult month at work and home. Looking into new job employment.   ADHD on concerta 18mg  daily. Denies headache, chest pain, insomnia. Tolerating med well. Appetite down - has lost weight and happy with this. Feels she may be more irritable with stimulant on board.   PTSD dx last year by psychology. Previously recommended counseling prior to medication. She has started seeing counselor Rodena Piety. She previously saw Dr Lurline Hare as well. Interested in medication to help anxiety exacerbations.   Due for physical.     Relevant past medical, surgical, family and social history reviewed and updated as indicated. Interim medical history since our last visit reviewed. Allergies and medications reviewed and updated. Outpatient Medications Prior to Visit  Medication Sig Dispense Refill  . CANNABIDIOL PO Take by mouth 2 (two) times a week.    . cholecalciferol (VITAMIN D) 1000 UNITS tablet Take 2,000 Units by mouth daily.    . hydrochlorothiazide (HYDRODIURIL) 12.5 MG tablet TAKE 1 TABLET BY MOUTH EVERY DAY 90 tablet 3  . hydrOXYzine (ATARAX/VISTARIL) 10 MG tablet Take 10 mg by mouth 3 (three) times daily as needed for itching (hives).    Marland Kitchen ibuprofen (ADVIL,MOTRIN) 200 MG tablet Take 200 mg by mouth daily as needed for moderate pain.     . Iron-Vitamins (GERITOL PO) Take 1 tablet by mouth daily.    . methylphenidate (CONCERTA) 18 MG PO CR tablet Take 1 tablet (18 mg total) by mouth daily. 30 tablet 0    . Multiple Vitamin (MULTIVITAMIN) tablet Take 1 tablet by mouth daily.      . ranitidine (ZANTAC) 150 MG tablet Take 150 mg by mouth daily. Reported on 12/08/2015     No facility-administered medications prior to visit.      Per HPI unless specifically indicated in ROS section below Review of Systems Objective:    BP 138/84 (BP Location: Left Arm, Patient Position: Sitting, Cuff Size: Large)   Pulse (!) 105   Temp 98.2 F (36.8 C) (Oral)   Ht 5\' 5"  (1.651 m)   Wt 226 lb 2 oz (102.6 kg)   LMP 09/01/2018   SpO2 98%   BMI 37.63 kg/m   Wt Readings from Last 3 Encounters:  09/16/18 226 lb 2 oz (102.6 kg)  06/03/18 233 lb 12 oz (106 kg)  02/28/18 244 lb 12 oz (111 kg)    Physical Exam Vitals signs and nursing note reviewed.  Constitutional:      Appearance: Normal appearance.  Neurological:     Mental Status: She is alert.  Psychiatric:        Mood and Affect: Mood normal.        Behavior: Behavior normal.     Comments: Tearful with discussion of some stressors       Results for orders placed or performed in visit on 10/30/17  Pain Mgmt, Profile  8 w/Conf, U  Result Value Ref Range   Prescribed Drug 1 Methylphenidate    Creatinine 228.0 > or = 20. mg/dL   pH 5.97 4.5 - 9.0   Oxidant NEGATIVE <200 mcg/mL   Amphetamines NEGATIVE <500 ng/mL   medMATCH Amphetamines CONSISTENT    Benzodiazepines NEGATIVE <100 ng/mL   medMATCH Benzodiazepines CONSISTENT    Marijuana Metabolite NEGATIVE CONFIRMED <20 ng/mL   Marijuana Metabolite NEGATIVE <5 ng/mL   medMATCH Marijuana Metab CONSISTENT    Cocaine Metabolite NEGATIVE <150 ng/mL   medMATCH Cocaine Metab CONSISTENT    Opiates NEGATIVE <100 ng/mL   medMATCH Opiates CONSISTENT    Oxycodone NEGATIVE <100 ng/mL   medMATCH Oxycodone CONSISTENT    Prescribed Drug 1 Methylphenidate    Buprenorphine, Urine NEGATIVE <5 ng/mL   medMATCH Buprenorphine CONSISTENT    Prescribed Drug 1 Methylphenidate    MDMA NEGATIVE <500 ng/mL    [facility] MDMA CONSISTENT    Prescribed Drug 1 Methylphenidate    Alcohol Metabolites NEGATIVE <500 ng/mL   [facility] Alcohol Metab CONSISTENT    Prescribed Drug 1 Methylphenidate    6 Acetylmorphine NEGATIVE <10 ng/mL   medMATCH 6 Acetylmorphine CONSISTENT    Assessment & Plan:   Problem List Items Addressed This Visit    PTSD (post-traumatic stress disorder)    Continued struggle, seeing counselor Rodena Piety at our office - encouraged she continue this.  Interested in medication to help anxiety/PTSD - discussed daily SSRI vs PRN benzo. She is more interested at this time in PRN medication. Discussed risks of benzo including habit forming nature, risk of dependence and tolerance. Will start low dose, use sparingly.       Relevant Medications   LORazepam (ATIVAN) 0.5 MG tablet   Attention deficit hyperactivity disorder (ADHD), combined type - Primary    Feels concerta is effective for ADHD - will continue at current dose.       Anxiety    Ongoing struggle - see below. Will trial low dose benzo PRN      Relevant Medications   LORazepam (ATIVAN) 0.5 MG tablet       Meds ordered this encounter  Medications  . LORazepam (ATIVAN) 0.5 MG tablet    Sig: Take 1 tablet (0.5 mg total) by mouth 2 (two) times daily as needed for anxiety.    Dispense:  30 tablet    Refill:  0   No orders of the defined types were placed in this encounter.   Patient Instructions  Continue concerta 18mg  daily.  Trial lorazepam 0.5mg  as needed for anxiety.  Continue seeing Rodena Piety for counseling.  Schedule physical at your convenience.    Follow up plan: Return if symptoms worsen or fail to improve.  [name], MD

## 2018-09-17 NOTE — Assessment & Plan Note (Signed)
Ongoing struggle - see below. Will trial low dose benzo PRN

## 2018-09-17 NOTE — Assessment & Plan Note (Signed)
Continued struggle, seeing counselor Rodena Piety at our office - encouraged she continue this.  Interested in medication to help anxiety/PTSD - discussed daily SSRI vs PRN benzo. She is more interested at this time in PRN medication. Discussed risks of benzo including habit forming nature, risk of dependence and tolerance. Will start low dose, use sparingly.

## 2018-09-17 NOTE — Assessment & Plan Note (Signed)
Feels concerta is effective for ADHD - will continue at current dose.

## 2018-10-01 ENCOUNTER — Other Ambulatory Visit: Payer: Self-pay | Admitting: Family Medicine

## 2018-10-01 NOTE — Telephone Encounter (Signed)
Pt need refill for    Methylphenidate 18 mg     [facility]

## 2018-10-01 NOTE — Telephone Encounter (Signed)
Name of Medication: Concerta Name of Pharmacy: [facility] Last Fill or Written Date and Quantity: 08/29/18, #30 Last Office Visit and Type: 09/16/18, f/u Next Office Visit and Type: none Last Controlled Substance Agreement Date: 10/30/17 Last UDS: 10/30/17

## 2018-10-03 MED ORDER — METHYLPHENIDATE HCL ER (OSM) 18 MG PO TBCR: 18.0000 mg | EXTENDED_RELEASE_TABLET | Freq: Every day | ORAL | 0 refills | Status: DC

## 2018-10-03 NOTE — Telephone Encounter (Signed)
Eprescribed.

## 2018-10-06 ENCOUNTER — Ambulatory Visit (INDEPENDENT_AMBULATORY_CARE_PROVIDER_SITE_OTHER): Payer: BC Managed Care – PPO | Admitting: Psychology

## 2018-10-06 DIAGNOSIS — F4310 Post-traumatic stress disorder, unspecified: Secondary | ICD-10-CM | POA: Diagnosis not present

## 2018-10-08 ENCOUNTER — Ambulatory Visit (INDEPENDENT_AMBULATORY_CARE_PROVIDER_SITE_OTHER): Payer: BC Managed Care – PPO | Admitting: Psychology

## 2018-10-08 DIAGNOSIS — F4310 Post-traumatic stress disorder, unspecified: Secondary | ICD-10-CM

## 2018-10-16 ENCOUNTER — Ambulatory Visit (INDEPENDENT_AMBULATORY_CARE_PROVIDER_SITE_OTHER): Payer: BC Managed Care – PPO | Admitting: Psychology

## 2018-10-16 DIAGNOSIS — F4310 Post-traumatic stress disorder, unspecified: Secondary | ICD-10-CM | POA: Diagnosis not present

## 2018-10-30 ENCOUNTER — Ambulatory Visit (INDEPENDENT_AMBULATORY_CARE_PROVIDER_SITE_OTHER): Payer: BC Managed Care – PPO | Admitting: Psychology

## 2018-10-30 DIAGNOSIS — F4310 Post-traumatic stress disorder, unspecified: Secondary | ICD-10-CM

## 2018-11-12 ENCOUNTER — Ambulatory Visit (INDEPENDENT_AMBULATORY_CARE_PROVIDER_SITE_OTHER): Payer: BC Managed Care – PPO | Admitting: Psychology

## 2018-11-12 DIAGNOSIS — F4310 Post-traumatic stress disorder, unspecified: Secondary | ICD-10-CM | POA: Diagnosis not present

## 2018-11-18 ENCOUNTER — Telehealth: Payer: Self-pay | Admitting: Family Medicine

## 2018-11-18 MED ORDER — METHYLPHENIDATE HCL ER (OSM) 18 MG PO TBCR: 18.0000 mg | EXTENDED_RELEASE_TABLET | Freq: Every day | ORAL | 0 refills | Status: DC

## 2018-11-18 NOTE — Telephone Encounter (Signed)
Patient is requesting a refill.   Concerta  Patient is almost out of medication, 2 tablets left   [facility] Phone [phone]

## 2018-11-18 NOTE — Telephone Encounter (Signed)
Name of Medication: Concerta Name of Pharmacy: [facility] Last Fill or Written Date and Quantity: 10/03/18, #60 Last Office Visit and Type: 09/16/18, f/u Next Office Visit and Type: none Last Controlled Substance Agreement Date: 10/30/17 Last UDS: 10/30/17

## 2018-11-18 NOTE — Telephone Encounter (Signed)
E prescribed Would offer virtual physical as due.

## 2018-11-19 NOTE — Telephone Encounter (Signed)
Tried calling patient and leaving message but voicemail not set up will try to call again at a different time.

## 2018-12-03 ENCOUNTER — Other Ambulatory Visit: Payer: Self-pay | Admitting: Family Medicine

## 2018-12-03 DIAGNOSIS — Z1231 Encounter for screening mammogram for malignant neoplasm of breast: Secondary | ICD-10-CM

## 2018-12-09 ENCOUNTER — Ambulatory Visit (INDEPENDENT_AMBULATORY_CARE_PROVIDER_SITE_OTHER): Payer: BC Managed Care – PPO | Admitting: Psychology

## 2018-12-09 DIAGNOSIS — F902 Attention-deficit hyperactivity disorder, combined type: Secondary | ICD-10-CM | POA: Diagnosis not present

## 2018-12-10 ENCOUNTER — Ambulatory Visit: Payer: BC Managed Care – PPO | Admitting: Psychology

## 2018-12-15 ENCOUNTER — Ambulatory Visit: Payer: BC Managed Care – PPO | Admitting: Obstetrics & Gynecology

## 2018-12-15 DIAGNOSIS — Z01419 Encounter for gynecological examination (general) (routine) without abnormal findings: Secondary | ICD-10-CM

## 2018-12-19 ENCOUNTER — Ambulatory Visit (INDEPENDENT_AMBULATORY_CARE_PROVIDER_SITE_OTHER): Payer: BC Managed Care – PPO | Admitting: Psychology

## 2018-12-19 DIAGNOSIS — F4310 Post-traumatic stress disorder, unspecified: Secondary | ICD-10-CM | POA: Diagnosis not present

## 2018-12-19 DIAGNOSIS — F902 Attention-deficit hyperactivity disorder, combined type: Secondary | ICD-10-CM | POA: Diagnosis not present

## 2018-12-20 ENCOUNTER — Encounter: Payer: Self-pay | Admitting: Internal Medicine

## 2018-12-20 ENCOUNTER — Ambulatory Visit (INDEPENDENT_AMBULATORY_CARE_PROVIDER_SITE_OTHER): Payer: BC Managed Care – PPO | Admitting: Internal Medicine

## 2018-12-20 VITALS — Wt 226.0 lb

## 2018-12-20 DIAGNOSIS — Z566 Other physical and mental strain related to work: Secondary | ICD-10-CM

## 2018-12-20 MED ORDER — LORAZEPAM 0.5 MG PO TABS: 0.5000 mg | ORAL_TABLET | Freq: Two times a day (BID) | ORAL | 0 refills | Status: DC | PRN
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
## 2018-12-22 ENCOUNTER — Ambulatory Visit: Payer: BC Managed Care – PPO | Admitting: Family Medicine

## 2018-12-22 ENCOUNTER — Telehealth: Payer: Self-pay

## 2018-12-22 ENCOUNTER — Encounter: Payer: Self-pay | Admitting: Family Medicine

## 2018-12-22 ENCOUNTER — Other Ambulatory Visit: Payer: Self-pay

## 2018-12-22 ENCOUNTER — Telehealth (HOSPITAL_COMMUNITY): Payer: Self-pay | Admitting: *Deleted

## 2018-12-22 ENCOUNTER — Ambulatory Visit (INDEPENDENT_AMBULATORY_CARE_PROVIDER_SITE_OTHER)
Admission: RE | Admit: 2018-12-22 | Discharge: 2018-12-22 | Disposition: A | Discharge status: Home / Self Care | Payer: BC Managed Care – PPO | Source: Ambulatory Visit | Attending: Family Medicine | Admitting: Family Medicine

## 2018-12-22 VITALS — BP 140/102 | HR 98 | Temp 98.0°F | Ht 65.0 in | Wt 227.9 lb

## 2018-12-22 DIAGNOSIS — I10 Essential (primary) hypertension: Secondary | ICD-10-CM | POA: Diagnosis not present

## 2018-12-22 DIAGNOSIS — F419 Anxiety disorder, unspecified: Secondary | ICD-10-CM

## 2018-12-22 DIAGNOSIS — R29898 Other symptoms and signs involving the musculoskeletal system: Secondary | ICD-10-CM | POA: Insufficient documentation

## 2018-12-22 DIAGNOSIS — Z566 Other physical and mental strain related to work: Secondary | ICD-10-CM

## 2018-12-22 LAB — LIPID PANEL
Cholesterol: 209 mg/dL — ABNORMAL HIGH (ref 0–200)
HDL: 64.6 mg/dL (ref >39.00)
LDL Cholesterol: 120 mg/dL — ABNORMAL HIGH (ref 0–99)
NonHDL: 143.96
Total CHOL/HDL Ratio: 3
Triglycerides: 122 mg/dL (ref 0.0–149.0)
VLDL: 24.4 mg/dL (ref 0.0–40.0)

## 2018-12-22 LAB — COMPREHENSIVE METABOLIC PANEL
ALT: 15 U/L (ref 0–35)
AST: 18 U/L (ref 0–37)
Albumin: 4.2 g/dL (ref 3.5–5.2)
Alkaline Phosphatase: 115 U/L (ref 39–117)
BUN: 17 mg/dL (ref 6–23)
CO2: 28 mEq/L (ref 19–32)
Calcium: 9.6 mg/dL (ref 8.4–10.5)
Chloride: 102 mEq/L (ref 96–112)
Creatinine, Ser: 0.86 mg/dL (ref 0.40–1.20)
GFR: 84.51 mL/min (ref >60.00)
Glucose, Bld: 101 mg/dL — ABNORMAL HIGH (ref 70–99)
Potassium: 4.4 mEq/L (ref 3.5–5.1)
Sodium: 139 mEq/L (ref 135–145)
Total Bilirubin: 0.5 mg/dL (ref 0.2–1.2)
Total Protein: 7.3 g/dL (ref 6.0–8.3)

## 2018-12-22 LAB — TSH: TSH: 1.66 u[IU]/mL (ref 0.35–4.50)

## 2018-12-22 MED ORDER — METOPROLOL SUCCINATE ER 50 MG PO TB24: 50.0000 mg | ORAL_TABLET | Freq: Every day | ORAL | 6 refills | Status: DC

## 2018-12-22 NOTE — Assessment & Plan Note (Signed)
Ongoing struggle - requests letter excusing her for the rest of the summer due to health conditions which I think is reasonable. Already has FMLA through psychology.

## 2018-12-22 NOTE — Telephone Encounter (Signed)
[facility] Night - Client TELEPHONE ADVICE RECORD [facility] Patient Name: Theresa Pratt Gender: Female DOB: 17-Sep-1970 Age: 48 Y 19 D Return Phone Number: [phone] (Primary), [phone] (Secondary) [place] [address] Client [facility] Night - Client Client Site [place] Physician [name] - MD Contact Type Call Who Is Calling Patient / Member / Family / Caregiver Call Type Triage / Clinical Relationship To Patient Self Return Phone Number [phone] (Primary) Chief Complaint Headache Reason for Call Medication Question / Request Initial Comment Caller states she is having a ptsd episode. States she is needing a refill for her medication. Pt has a headache. Translation No Nurse Assessment Nurse: Sarajane Jews, RN, Morey Hummingbird Date/Time Eilene Ghazi Time): 12/20/2018 10:06:31 AM Confirm and document reason for call. If symptomatic, describe symptoms. ---Caller states that she broke out in hives last night, has been crying uncontrollably. She states that this has been brought on by stress related to the stress of her job. She states that she is out of her medication and needs a refill as well. She states that she is prescribed Lorazepam 5 mg twice daily for anxiety. Has the patient had close contact with a person known or suspected to have the novel coronavirus illness OR traveled / lives in area with major community spread (including international travel) in the last 14 days from the onset of symptoms? * If Asymptomatic, screen for exposure and travel within the last 14 days. ---No Does the patient have any new or worsening symptoms? ---Yes Will a triage be completed? ---Yes Related visit to physician within the last 2 weeks? ---No Does the PT have any chronic conditions? (i.e. diabetes, asthma, this includes High risk factors for pregnancy, etc.) ---Yes List  chronic conditions. ---PTSD, anxiety Is the patient pregnant or possibly pregnant? (Ask all females between the ages of 29-55) ---No Is this a behavioral health or substance abuse call? ---No Guidelines Guideline Title Affirmed Question Affirmed Notes Nurse Date/Time (Eastern Time) Anxiety and Panic Attack Symptoms interfere with work or school [place], Therapist, sports, Morey Hummingbird 12/20/2018 10:19:21 AM PLEASE NOTE: All timestamps contained within this report are represented as [place] Standard Time. CONFIDENTIALTY NOTICE: This fax transmission is intended only for the addressee. It contains information that is legally privileged, confidential or otherwise protected from use or disclosure. If you are not the intended recipient, you are strictly prohibited from reviewing, disclosing, copying using or disseminating any of this information or taking any action in reliance on or regarding this information. If you have received this fax in error, please notify us immediately by telephone so that we can arrange for its return to [place]. Phone: [phone], Toll-Free: [phone], Fax: [phone] Page: 2 of 2 Call Id: [id number] [place]. Time Eilene Ghazi Time) Disposition Final User 12/20/2018 10:31:08 AM See PCP within 24 Hours Yes Sarajane Jews, RN, Otto Herb Disagree/Comply Comply Caller Understands Yes PreDisposition Go to Urgent Care/Walk-In Clinic Care Advice Given Per Guideline SEE PCP WITHIN 24 HOURS: CARE ADVICE given per Anxiety and Panic Attack (Adult) guideline. * IF OFFICE WILL BE CLOSED AND PCP SECOND-LEVEL TRIAGE REQUIRED: You may need to be seen within the next 24 hours. Your doctor (or NP/PA) will want to talk with you to decide what's best. I'll page the on-call provider now. NOTE: Since this isn't serious, hold the page between 10 pm and 7 am. Page the on-call provider in the morning. CALL BACK IF: * You feel like harming yourself * You become  worse. Referrals GO

## 2018-12-22 NOTE — Telephone Encounter (Signed)
The above patient or their representative was contacted and gave the following answers to these questions:         Do you have any of the following symptoms?  no  Fever                    Cough                   Shortness of breath  Do  you have any of the following other symptoms? no   muscle pain         vomiting,        diarrhea        rash         weakness        red eye        abdominal pain         bruising          bruising or bleeding              joint pain           severe headache    Have you been in contact with someone who was or has been sick in the past 2 weeks?  no  Yes                 Unsure                         Unable to assess   Does the person that you were in contact with have any of the following symptoms?   Cough         shortness of breath           muscle pain         vomiting,            diarrhea            rash            weakness           fever            red eye           abdominal pain           bruising  or  bleeding                joint pain                severe headache               Have you  or someone you have been in contact with traveled internationally in th last month?   no      If yes, which countries?   Have you  or someone you have been in contact with traveled outside [place] in th last month?   no      If yes, which state and city?   COMMENTS OR ACTION PLAN FOR THIS PATIENT:          

## 2018-12-22 NOTE — Assessment & Plan Note (Signed)
Acute worsening anticipate stressors contributing. Concern for hypertensive urgency over weekend, with concerning neurological sxs- see below. Will continue hctz 12.5mg , add toprol XL 50mg  daily (hopeful to help recently noted tachycardia).

## 2018-12-22 NOTE — Telephone Encounter (Signed)
Pt left v/m requesting cb about results of CT scan done earlier today.

## 2018-12-22 NOTE — Patient Instructions (Signed)
Labs today See Theresa Pratt to schedule imaging study. Start metoprolol XL 50mg  once daily in addition to your HCTZ. Continue monitoring blood pressures at home. We will be in touch with these results.

## 2018-12-22 NOTE — Assessment & Plan Note (Signed)
New over weekend, associated with parethesia in setting of elevated blood pressures. Concern for cerebrovascular disease as possible cause - will start stroke workup with head CT, carotid US, labwork including TSH and FLP. Reviewed neurological symptoms to seek urgent care. Doubt cervical disc disease related.

## 2018-12-22 NOTE — Progress Notes (Signed)
This visit was conducted in person.  BP (!) 140/102 (BP Location: Right Arm, Cuff Size: Large)   Pulse 98   Temp 98 F (36.7 C) (Oral)   Ht 5\' 5"  (1.651 m)   Wt 227 lb 14.4 oz (103.4 kg)   LMP 12/20/2018   SpO2 97%   BMI 37.92 kg/m   BP Readings from Last 3 Encounters:  12/22/18 (!) 140/102  09/16/18 138/84  06/03/18 122/80    CC: L arm numbness Subjective:    Patient ID: [name], female    DOB: 15-Mar-1971, 48 y.o.   MRN: [id number]  HPI: STAISHA WINIARSKI is a 48 y.o. female presenting on 12/22/2018 for Numbness (pt is c/o of left arm tingling and numbness--started Friday/BP had been high--took BP med this morning already)   Saw Dr Silvio Pate this weekend for Saturday clinic visit for work related stress. Note reviewed, lorazepam refilled.   She is getting FMLA through Dr Lurline Hare for PRN stressors.   Increased stress on Friday, BP elevated over weekend up to 180/100s. BP elevated despite doubling up on HCTZ. Sunday morning she noticed entire L arm numbness and paresthesias with weakness that lasted 30 min (when BP 170/109). Neurological symptoms largely resolved but she still feels L arm heaviness. No neck pain associated with this.   Increased hives due to stress - improved with hydroxyzine.      Relevant past medical, surgical, family and social history reviewed and updated as indicated. Interim medical history since our last visit reviewed. Allergies and medications reviewed and updated. Outpatient Medications Prior to Visit  Medication Sig Dispense Refill  . CANNABIDIOL PO Take by mouth 2 (two) times a week.    . cholecalciferol (VITAMIN D) 1000 UNITS tablet Take 2,000 Units by mouth daily.    . hydrochlorothiazide (HYDRODIURIL) 12.5 MG tablet TAKE 1 TABLET BY MOUTH EVERY DAY 90 tablet 3  . hydrOXYzine (ATARAX/VISTARIL) 10 MG tablet Take 10 mg by mouth 3 (three) times daily as needed for itching (hives).    Marland Kitchen ibuprofen (ADVIL,MOTRIN) 200 MG tablet Take 200  mg by mouth daily as needed for moderate pain.     . Iron-Vitamins (GERITOL PO) Take 1 tablet by mouth daily.    Marland Kitchen LORazepam (ATIVAN) 0.5 MG tablet Take 1 tablet (0.5 mg total) by mouth 2 (two) times daily as needed for anxiety. 30 tablet 0  . methylphenidate (CONCERTA) 18 MG PO CR tablet Take 1 tablet (18 mg total) by mouth daily. 30 tablet 0  . Multiple Vitamin (MULTIVITAMIN) tablet Take 1 tablet by mouth daily.       No facility-administered medications prior to visit.      Per HPI unless specifically indicated in ROS section below Review of Systems Objective:    BP (!) 140/102 (BP Location: Right Arm, Cuff Size: Large)   Pulse 98   Temp 98 F (36.7 C) (Oral)   Ht 5\' 5"  (1.651 m)   Wt 227 lb 14.4 oz (103.4 kg)   LMP 12/20/2018   SpO2 97%   BMI 37.92 kg/m   Wt Readings from Last 3 Encounters:  12/22/18 227 lb 14.4 oz (103.4 kg)  12/20/18 226 lb (102.5 kg)  09/16/18 226 lb 2 oz (102.6 kg)    Physical Exam Vitals signs and nursing note reviewed.  Constitutional:      Appearance: Normal appearance. She is not ill-appearing.  HENT:     Head: Normocephalic and atraumatic.     Mouth/Throat:  Mouth: Mucous membranes are moist.     Pharynx: No posterior oropharyngeal erythema.  Eyes:     Extraocular Movements: Extraocular movements intact.     Pupils: Pupils are equal, round, and reactive to light.  Neck:     Musculoskeletal: Normal range of motion and neck supple.     Vascular: No carotid bruit.     Comments: No midline neck pain FROM at neck Cardiovascular:     Rate and Rhythm: Normal rate and regular rhythm.     Pulses: Normal pulses.     Heart sounds: Normal heart sounds. No murmur.  Pulmonary:     Effort: Pulmonary effort is normal. No respiratory distress.     Breath sounds: Normal breath sounds. No wheezing, rhonchi or rales.  Musculoskeletal:        General: No swelling.  Skin:    General: Skin is warm and dry.     Capillary Refill: Capillary refill takes  less than 2 seconds.     Findings: No rash.  Neurological:     Mental Status: She is alert.     Cranial Nerves: Cranial nerve deficit present. No dysarthria or facial asymmetry.     Sensory: Sensory deficit present.     Coordination: Coordination is intact.     Gait: Gait is intact.     Comments: CN 2-12 except diminished sensation to light touch at L face along V2/V3 distribution, weakness noted with L shoulder shrug.  Slightly diminished grip strength L hand, diminished sensation to light touch at L hand.  FTN intact EOMI without nystagmus  Psychiatric:        Mood and Affect: Mood normal. Affect is tearful.     Comments: Tearful with discussion of recent work stressors       Lab Results  Component Value Date   CREATININE 0.97 02/21/2017   BUN 12 02/21/2017   NA 138 02/21/2017   K 3.6 02/21/2017   CL 103 02/21/2017   CO2 26 02/21/2017    Lab Results  Component Value Date   CHOL 193 12/13/2016   HDL 56.10 12/13/2016   LDLCALC 108 (H) 12/13/2016   TRIG 148.0 12/13/2016   CHOLHDL 3 12/13/2016   Lab Results  Component Value Date   TSH 0.99 12/13/2016    Assessment & Plan:   Problem List Items Addressed This Visit    Work-related stress   Left arm weakness - Primary    New over weekend, associated with parethesia in setting of elevated blood pressures. Concern for cerebrovascular disease as possible cause - will start stroke workup with head CT, carotid US, labwork including TSH and FLP. Reviewed neurological symptoms to seek urgent care. Doubt cervical disc disease related.       Relevant Orders   CT Head Wo Contrast (Completed)   VAS US CAROTID   Lipid panel   Comprehensive metabolic panel   TSH   Essential hypertension    Acute worsening anticipate stressors contributing. Concern for hypertensive urgency over weekend, with concerning neurological sxs- see below. Will continue hctz 12.5mg , add toprol XL 50mg  daily (hopeful to help recently noted tachycardia).        Relevant Medications   metoprolol succinate (TOPROL-XL) 50 MG 24 hr tablet   Anxiety    Ongoing struggle - requests letter excusing her for the rest of the summer due to health conditions which I think is reasonable. Already has FMLA through psychology.           Meds ordered  this encounter  Medications  . metoprolol succinate (TOPROL-XL) 50 MG 24 hr tablet    Sig: Take 1 tablet (50 mg total) by mouth daily. Take with or immediately following a meal.    Dispense:  30 tablet    Refill:  6   Orders Placed This Encounter  Procedures  . CT Head Wo Contrast    SS. Rosaria Ferries [phone]. bcbs    Standing Status:   Future    Number of Occurrences:   1    Standing Expiration Date:   03/23/2020    Order Specific Question:   ** REASON FOR EXAM (FREE TEXT)    Answer:   L arm weakness/paresthesias    Order Specific Question:   Is patient pregnant?    Answer:   No    Order Specific Question:   Preferred imaging location?    Answer:   [facility]    Order Specific Question:   Radiology Contrast Protocol - do NOT remove file path    Answer:   \\[email or web address].[email or web address]  . Lipid panel  . Comprehensive metabolic panel  . TSH    Follow up plan: No follow-ups on file.  [name], MD

## 2018-12-22 NOTE — Telephone Encounter (Signed)
Per chart review tab pt had virtual visit with Dr Silvio Pate on Sat clinic. I spoke with pt and on 12/21/18 her lt arm tingling and limp for 30' and feeling in arm came back. No CP, SOB,H/A or dizziness;no weakness in other limbs and no difficulty walking or talking. ED precautions given. Pt has in office appt with Dr Darnell Level today at 10:15. FYI to Dr Baldwin Crown CMA.

## 2018-12-22 NOTE — Telephone Encounter (Signed)
CT results discussed with patient via telephone. (see results note)

## 2018-12-23 ENCOUNTER — Ambulatory Visit (INDEPENDENT_AMBULATORY_CARE_PROVIDER_SITE_OTHER): Payer: BC Managed Care – PPO | Admitting: Psychology

## 2018-12-23 DIAGNOSIS — F4310 Post-traumatic stress disorder, unspecified: Secondary | ICD-10-CM | POA: Diagnosis not present

## 2018-12-24 ENCOUNTER — Other Ambulatory Visit: Payer: Self-pay

## 2018-12-24 ENCOUNTER — Ambulatory Visit (HOSPITAL_COMMUNITY)
Admission: RE | Admit: 2018-12-24 | Discharge: 2018-12-24 | Disposition: A | Discharge status: Home / Self Care | Payer: BC Managed Care – PPO | Source: Ambulatory Visit | Attending: Family | Admitting: Family

## 2018-12-24 DIAGNOSIS — R29898 Other symptoms and signs involving the musculoskeletal system: Secondary | ICD-10-CM

## 2018-12-25 ENCOUNTER — Telehealth: Payer: Self-pay | Admitting: Family Medicine

## 2018-12-25 DIAGNOSIS — R29898 Other symptoms and signs involving the musculoskeletal system: Secondary | ICD-10-CM

## 2018-12-25 DIAGNOSIS — Z1211 Encounter for screening for malignant neoplasm of colon: Secondary | ICD-10-CM

## 2018-12-25 NOTE — Telephone Encounter (Signed)
Spoke with patient about recent labs and imaging studies.  Will proceed with MRI given recent L arm weakness/numbness. Will also refer for colonoscopy per pt request.

## 2018-12-29 ENCOUNTER — Other Ambulatory Visit: Payer: Self-pay | Admitting: Family Medicine

## 2018-12-29 NOTE — Telephone Encounter (Signed)
Patient needs a refill on her Aderall.  Patient uses [facility].

## 2018-12-31 ENCOUNTER — Ambulatory Visit: Payer: BC Managed Care – PPO | Admitting: *Deleted

## 2018-12-31 ENCOUNTER — Other Ambulatory Visit: Payer: Self-pay

## 2018-12-31 ENCOUNTER — Encounter: Payer: Self-pay | Admitting: Gastroenterology

## 2018-12-31 VITALS — Ht 65.0 in | Wt 226.0 lb

## 2018-12-31 DIAGNOSIS — Z1211 Encounter for screening for malignant neoplasm of colon: Secondary | ICD-10-CM

## 2018-12-31 MED ORDER — SUPREP BOWEL PREP KIT 17.5-3.13-1.6 GM/177ML PO SOLN: 1.0000 | Freq: Once | ORAL | 0 refills | Status: AC

## 2018-12-31 NOTE — Progress Notes (Signed)
No egg or soy allergy known to patient  No issues with past sedation with any surgeries  or procedures, no intubation problems  No diet pills per patient No home 02 use per patient  No blood thinners per patient  Pt denies issues with constipation  No A fib or A flutter  EMMI video sent to pt's e mail   Pt verified name, DOB, address and insurance during PV today. Pt mailed instruction packet to included paper to complete and mail back to [facility] with addressed and stamped envelope, Emmi video, copy of consent form to read and not return, and instructions. Suprep $15  coupon mailed in packet. PV completed over the phone. Pt encouraged to call with questions or issues    Pt is aware that care partner will wait in the car during parking lot; if they feel like they will be too hot to wait in the car; they may wait in the lobby.  We want them to wear a mask (we do not have any that we can provide them), practice social distancing, and we will check their temperatures when they get here.  I did remind patient that their care partner needs to stay in the parking lot the entire time. Pt will wear mask into building

## 2019-01-01 ENCOUNTER — Ambulatory Visit (INDEPENDENT_AMBULATORY_CARE_PROVIDER_SITE_OTHER): Payer: BC Managed Care – PPO | Admitting: Psychology

## 2019-01-01 DIAGNOSIS — F4310 Post-traumatic stress disorder, unspecified: Secondary | ICD-10-CM

## 2019-01-01 NOTE — Telephone Encounter (Signed)
Name of Medication: Concerta Name of Pharmacy: [facility] Last Fill or Written Date and Quantity: 5/5/520, #30 Last Office Visit and Type: 12/22/18, acute Next Office Visit and Type: 01/20/19, CPE Last Controlled Substance Agreement Date: 11/29/17 Last UDS: 11/29/17

## 2019-01-02 ENCOUNTER — Telehealth: Payer: Self-pay | Admitting: Family Medicine

## 2019-01-02 MED ORDER — METHYLPHENIDATE HCL ER (OSM) 18 MG PO TBCR: 18.0000 mg | EXTENDED_RELEASE_TABLET | Freq: Every day | ORAL | 0 refills | Status: DC

## 2019-01-02 NOTE — Telephone Encounter (Signed)
Left message on vm per dpr informing pt I do not see the results yet.  But when Dr. Darnell Level receives and reviews them, we will contact her with the results.

## 2019-01-02 NOTE — Telephone Encounter (Signed)
Eprescribed.

## 2019-01-02 NOTE — Telephone Encounter (Signed)
Pt called and left a v/m requesting MRI results - faxed to Dr Danise Mina on 6/17  Please advise if received. Thanks.

## 2019-01-02 NOTE — Telephone Encounter (Signed)
Spoke with pt - MRI normal. Will send for scanning.

## 2019-01-09 ENCOUNTER — Other Ambulatory Visit: Payer: Self-pay | Admitting: Primary Care

## 2019-01-13 ENCOUNTER — Other Ambulatory Visit: Payer: Self-pay | Admitting: Family Medicine

## 2019-01-13 DIAGNOSIS — E559 Vitamin D deficiency, unspecified: Secondary | ICD-10-CM

## 2019-01-13 DIAGNOSIS — E042 Nontoxic multinodular goiter: Secondary | ICD-10-CM

## 2019-01-13 DIAGNOSIS — I10 Essential (primary) hypertension: Secondary | ICD-10-CM

## 2019-01-13 DIAGNOSIS — Z9884 Bariatric surgery status: Secondary | ICD-10-CM

## 2019-01-13 DIAGNOSIS — E538 Deficiency of other specified B group vitamins: Secondary | ICD-10-CM

## 2019-01-13 DIAGNOSIS — K909 Intestinal malabsorption, unspecified: Secondary | ICD-10-CM

## 2019-01-14 ENCOUNTER — Ambulatory Visit (INDEPENDENT_AMBULATORY_CARE_PROVIDER_SITE_OTHER): Payer: BC Managed Care – PPO | Admitting: Psychology

## 2019-01-14 ENCOUNTER — Other Ambulatory Visit (INDEPENDENT_AMBULATORY_CARE_PROVIDER_SITE_OTHER): Payer: BC Managed Care – PPO

## 2019-01-14 ENCOUNTER — Other Ambulatory Visit: Payer: Self-pay

## 2019-01-14 ENCOUNTER — Telehealth: Payer: Self-pay | Admitting: Family Medicine

## 2019-01-14 DIAGNOSIS — E538 Deficiency of other specified B group vitamins: Secondary | ICD-10-CM

## 2019-01-14 DIAGNOSIS — F4310 Post-traumatic stress disorder, unspecified: Secondary | ICD-10-CM | POA: Diagnosis not present

## 2019-01-14 DIAGNOSIS — E559 Vitamin D deficiency, unspecified: Secondary | ICD-10-CM

## 2019-01-14 DIAGNOSIS — I10 Essential (primary) hypertension: Secondary | ICD-10-CM | POA: Diagnosis not present

## 2019-01-14 DIAGNOSIS — K909 Intestinal malabsorption, unspecified: Secondary | ICD-10-CM | POA: Diagnosis not present

## 2019-01-14 DIAGNOSIS — Z9884 Bariatric surgery status: Secondary | ICD-10-CM

## 2019-01-14 LAB — IBC PANEL
Iron: 50 ug/dL (ref 42–145)
Saturation Ratios: 11.4 % — ABNORMAL LOW (ref 20.0–50.0)
Transferrin: 314 mg/dL (ref 212.0–360.0)

## 2019-01-14 LAB — VITAMIN D 25 HYDROXY (VIT D DEFICIENCY, FRACTURES): VITD: 20.04 ng/mL — ABNORMAL LOW (ref 30.00–100.00)

## 2019-01-14 LAB — BASIC METABOLIC PANEL
BUN: 12 mg/dL (ref 6–23)
CO2: 28 mEq/L (ref 19–32)
Calcium: 8.6 mg/dL (ref 8.4–10.5)
Chloride: 106 mEq/L (ref 96–112)
Creatinine, Ser: 0.85 mg/dL (ref 0.40–1.20)
GFR: 85.63 mL/min (ref >60.00)
Glucose, Bld: 99 mg/dL (ref 70–99)
Potassium: 4.5 mEq/L (ref 3.5–5.1)
Sodium: 141 mEq/L (ref 135–145)

## 2019-01-14 LAB — FERRITIN: Ferritin: 9.2 ng/mL — ABNORMAL LOW (ref 10.0–291.0)

## 2019-01-14 LAB — FOLATE: Folate: 10.2 ng/mL (ref >5.9)

## 2019-01-14 LAB — VITAMIN B12: Vitamin B-12: 201 pg/mL — ABNORMAL LOW (ref 211–911)

## 2019-01-14 NOTE — Addendum Note (Signed)
Addended by: Cloyd Stagers on: 01/14/2019 03:10 PM   Modules accepted: Orders

## 2019-01-14 NOTE — Telephone Encounter (Signed)
Best number [phone] Pt needs letter stating she cannot go back to work for her summer contact.  She stated she has discussed this with you.  She has PSTD.  She is scheduled to go back to work 01/19/2019

## 2019-01-15 ENCOUNTER — Encounter: Payer: BC Managed Care – PPO | Admitting: Gastroenterology

## 2019-01-15 LAB — CBC WITH DIFFERENTIAL/PLATELET
Basophils Absolute: 0.1 10*3/uL (ref 0.0–0.1)
Basophils Relative: 0.8 % (ref 0.0–3.0)
Eosinophils Absolute: 0.3 10*3/uL (ref 0.0–0.7)
Eosinophils Relative: 2.7 % (ref 0.0–5.0)
HCT: 37.2 % (ref 36.0–46.0)
Hemoglobin: 11.7 g/dL — ABNORMAL LOW (ref 12.0–15.0)
Lymphocytes Relative: 15 % (ref 12.0–46.0)
Lymphs Abs: 1.7 10*3/uL (ref 0.7–4.0)
MCHC: 31.5 g/dL (ref 30.0–36.0)
MCV: 79.7 fl (ref 78.0–100.0)
Monocytes Absolute: 0.6 10*3/uL (ref 0.1–1.0)
Monocytes Relative: 5.8 % (ref 3.0–12.0)
Neutro Abs: 8.4 10*3/uL — ABNORMAL HIGH (ref 1.4–7.7)
Neutrophils Relative %: 75.7 % (ref 43.0–77.0)
Platelets: 366 10*3/uL (ref 150.0–400.0)
RBC: 4.67 Mil/uL (ref 3.87–5.11)
RDW: 16.9 % — ABNORMAL HIGH (ref 11.5–15.5)
WBC: 11.1 10*3/uL — ABNORMAL HIGH (ref 4.0–10.5)

## 2019-01-15 LAB — MICROALBUMIN / CREATININE URINE RATIO
Creatinine,U: 219.7 mg/dL
Microalb Creat Ratio: 0.6 mg/g (ref 0.0–30.0)
Microalb, Ur: 1.3 mg/dL (ref 0.0–1.9)

## 2019-01-15 NOTE — Telephone Encounter (Signed)
Letter written and in Lisa's box.  

## 2019-01-15 NOTE — Telephone Encounter (Signed)
Spoke with pt notifying her the letter is ready.  States she will pick it up.  Placed letter at front office.

## 2019-01-17 LAB — VITAMIN B1: Vitamin B1 (Thiamine): 6 nmol/L — ABNORMAL LOW (ref 8–30)

## 2019-01-20 ENCOUNTER — Telehealth: Payer: Self-pay | Admitting: Family Medicine

## 2019-01-20 ENCOUNTER — Other Ambulatory Visit: Payer: Self-pay

## 2019-01-20 ENCOUNTER — Encounter: Payer: Self-pay | Admitting: Family Medicine

## 2019-01-20 ENCOUNTER — Ambulatory Visit (INDEPENDENT_AMBULATORY_CARE_PROVIDER_SITE_OTHER): Payer: BC Managed Care – PPO | Admitting: Family Medicine

## 2019-01-20 VITALS — BP 120/82 | HR 97 | Temp 97.8°F | Ht 64.5 in | Wt 228.2 lb

## 2019-01-20 DIAGNOSIS — E538 Deficiency of other specified B group vitamins: Secondary | ICD-10-CM

## 2019-01-20 DIAGNOSIS — I10 Essential (primary) hypertension: Secondary | ICD-10-CM

## 2019-01-20 DIAGNOSIS — R29898 Other symptoms and signs involving the musculoskeletal system: Secondary | ICD-10-CM

## 2019-01-20 DIAGNOSIS — Z9884 Bariatric surgery status: Secondary | ICD-10-CM

## 2019-01-20 DIAGNOSIS — E559 Vitamin D deficiency, unspecified: Secondary | ICD-10-CM

## 2019-01-20 DIAGNOSIS — Z566 Other physical and mental strain related to work: Secondary | ICD-10-CM

## 2019-01-20 DIAGNOSIS — Z0000 Encounter for general adult medical examination without abnormal findings: Secondary | ICD-10-CM | POA: Diagnosis not present

## 2019-01-20 DIAGNOSIS — F419 Anxiety disorder, unspecified: Secondary | ICD-10-CM

## 2019-01-20 DIAGNOSIS — E669 Obesity, unspecified: Secondary | ICD-10-CM

## 2019-01-20 DIAGNOSIS — K909 Intestinal malabsorption, unspecified: Secondary | ICD-10-CM

## 2019-01-20 DIAGNOSIS — F902 Attention-deficit hyperactivity disorder, combined type: Secondary | ICD-10-CM

## 2019-01-20 DIAGNOSIS — L508 Other urticaria: Secondary | ICD-10-CM

## 2019-01-20 MED ORDER — LORAZEPAM 0.5 MG PO TABS: 0.5000 mg | ORAL_TABLET | Freq: Two times a day (BID) | ORAL | 0 refills | Status: DC | PRN

## 2019-01-20 MED ORDER — METOPROLOL SUCCINATE ER 50 MG PO TB24: 50.0000 mg | ORAL_TABLET | Freq: Every day | ORAL | 11 refills | Status: DC

## 2019-01-20 MED ORDER — HYDROXYZINE HCL 10 MG PO TABS: 10.0000 mg | ORAL_TABLET | Freq: Every day | ORAL | 11 refills | Status: DC | PRN

## 2019-01-20 MED ORDER — METHYLPHENIDATE HCL ER (OSM) 18 MG PO TBCR: 18.0000 mg | EXTENDED_RELEASE_TABLET | Freq: Every day | ORAL | 0 refills | Status: DC

## 2019-01-20 MED ORDER — CYANOCOBALAMIN 1000 MCG/ML IJ SOLN
1000.0000 ug | Freq: Once | INTRAMUSCULAR | Status: AC
  Administered 2019-01-20: 1000 ug via INTRAMUSCULAR

## 2019-01-20 MED ORDER — THIAMINE HCL 50 MG PO CAPS: 1.0000 | ORAL_CAPSULE | Freq: Every day | ORAL | 0 refills | Status: DC

## 2019-01-20 MED ORDER — CETIRIZINE HCL 10 MG PO TABS: 10.0000 mg | ORAL_TABLET | Freq: Every day | ORAL | 11 refills | Status: DC

## 2019-01-20 MED ORDER — HYDROCHLOROTHIAZIDE 12.5 MG PO TABS: 12.5000 mg | ORAL_TABLET | Freq: Every day | ORAL | 3 refills | Status: DC

## 2019-01-20 MED ORDER — VITAMIN D3 1.25 MG (50000 UT) PO TABS: 1.0000 | ORAL_TABLET | ORAL | 1 refills | Status: DC

## 2019-01-20 NOTE — Assessment & Plan Note (Addendum)
Continues hydroxyzine and cetirizine daily to help control chronic urticaria. Has seen allergists locally and at [facility]. No longer on singulair (previously very effective)

## 2019-01-20 NOTE — Assessment & Plan Note (Signed)
Encouraged ongoing efforts at healthy diet and lifestyle changes for sustainable weight loss.

## 2019-01-20 NOTE — Assessment & Plan Note (Signed)
Markedly low. Has not been repleting. Will start monthly B12 shots for 6 months then reassess.

## 2019-01-20 NOTE — Assessment & Plan Note (Signed)
Many marked vitamin deficiencies reviewed with patient.

## 2019-01-20 NOTE — Assessment & Plan Note (Signed)
Marked despite pt regularly taking 2-3 tablets daily. Will stop daily supplementation, start weekly 50k IU supplementation.

## 2019-01-20 NOTE — Assessment & Plan Note (Addendum)
Neurological evaluation reassuring (CT, MRI, labs). Discussed about possible contribution from vitamin B12 deficiency.  B12 shot today.

## 2019-01-20 NOTE — Telephone Encounter (Signed)
plz get patient set up for iron infusion (feraheme 510mg  weekly x 2 wks) at [facility] in [place].
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
## 2019-01-20 NOTE — Assessment & Plan Note (Signed)
Stress improved now she's out of work for summer. Looking for new job. Will continue to monitor.

## 2019-01-20 NOTE — Patient Instructions (Addendum)
Iron, vitamins B1, B12, and D were all low! We will set you up for b12 shots (monthly for 6 months), iron infusion.  First B12 shot today.  Start prescription vitamin D 50,000 units weekly.  Start vitamin B1 (thamine) 50mg  daily (over the counter pill).  Recheck levels in 6 months.   Health Maintenance, Female Adopting a healthy lifestyle and getting preventive care are important in promoting health and wellness. Ask your health care provider about:  The right schedule for you to have regular tests and exams.  Things you can do on your own to prevent diseases and keep yourself healthy. What should I know about diet, weight, and exercise? Eat a healthy diet   Eat a diet that includes plenty of vegetables, fruits, low-fat dairy products, and lean protein.  Do not eat a lot of foods that are high in solid fats, added sugars, or sodium. Maintain a healthy weight Body mass index (BMI) is used to identify weight problems. It estimates body fat based on height and weight. Your health care provider can help determine your BMI and help you achieve or maintain a healthy weight. Get regular exercise Get regular exercise. This is one of the most important things you can do for your health. Most adults should:  Exercise for at least 150 minutes each week. The exercise should increase your heart rate and make you sweat (moderate-intensity exercise).  Do strengthening exercises at least twice a week. This is in addition to the moderate-intensity exercise.  Spend less time sitting. Even light physical activity can be beneficial. Watch cholesterol and blood lipids Have your blood tested for lipids and cholesterol at 50 years of age, then have this test every 5 years. Have your cholesterol levels checked more often if:  Your lipid or cholesterol levels are high.  You are older than 50 years of age.  You are at high risk for heart disease. What should I know about cancer screening? Depending on  your health history and family history, you may need to have cancer screening at various ages. This may include screening for:  Breast cancer.  Cervical cancer.  Colorectal cancer.  Skin cancer.  Lung cancer. What should I know about heart disease, diabetes, and high blood pressure? Blood pressure and heart disease  High blood pressure causes heart disease and increases the risk of stroke. This is more likely to develop in people who have high blood pressure readings, are of African descent, or are overweight.  Have your blood pressure checked: ? Every 3-5 years if you are 45-59 years of age. ? Every year if you are 72 years old or older. Diabetes Have regular diabetes screenings. This checks your fasting blood sugar level. Have the screening done:  Once every three years after age 2 if you are at a normal weight and have a low risk for diabetes.  More often and at a younger age if you are overweight or have a high risk for diabetes. What should I know about preventing infection? Hepatitis B If you have a higher risk for hepatitis B, you should be screened for this virus. Talk with your health care provider to find out if you are at risk for hepatitis B infection. Hepatitis C Testing is recommended for:  Everyone born from 1 through 1967.  Anyone with known risk factors for hepatitis C. Sexually transmitted infections (STIs)  Get screened for STIs, including gonorrhea and chlamydia, if: ? You are sexually active and are younger than 50 years  of age. ? You are older than 50 years of age and your health care provider tells you that you are at risk for this type of infection. ? Your sexual activity has changed since you were last screened, and you are at increased risk for chlamydia or gonorrhea. Ask your health care provider if you are at risk.  Ask your health care provider about whether you are at high risk for HIV. Your health care provider may recommend a prescription  medicine to help prevent HIV infection. If you choose to take medicine to prevent HIV, you should first get tested for HIV. You should then be tested every 3 months for as long as you are taking the medicine. Pregnancy  If you are about to stop having your period (premenopausal) and you may become pregnant, seek counseling before you get pregnant.  Take 400 to 800 micrograms (mcg) of folic acid every day if you become pregnant.  Ask for birth control (contraception) if you want to prevent pregnancy. Osteoporosis and menopause Osteoporosis is a disease in which the bones lose minerals and strength with aging. This can result in bone fractures. If you are 54 years old or older, or if you are at risk for osteoporosis and fractures, ask your health care provider if you should:  Be screened for bone loss.  Take a calcium or vitamin D supplement to lower your risk of fractures.  Be given hormone replacement therapy (HRT) to treat symptoms of menopause. Follow these instructions at home: Lifestyle  Do not use any products that contain nicotine or tobacco, such as cigarettes, e-cigarettes, and chewing tobacco. If you need help quitting, ask your health care provider.  Do not use street drugs.  Do not share needles.  Ask your health care provider for help if you need support or information about quitting drugs. Alcohol use  Do not drink alcohol if: ? Your health care provider tells you not to drink. ? You are pregnant, may be pregnant, or are planning to become pregnant.  If you drink alcohol: ? Limit how much you use to 0-1 drink a day. ? Limit intake if you are breastfeeding.  Be aware of how much alcohol is in your drink. In the [place] one drink equals one 12 oz bottle of beer (355 mL), one 5 oz glass of wine (148 mL), or one 1 oz glass of hard liquor (44 mL). General instructions  Schedule regular health, dental, and eye exams.  Stay current with your vaccines.  Tell your health  care provider if: ? You often feel depressed. ? You have ever been abused or do not feel safe at home. Summary  Adopting a healthy lifestyle and getting preventive care are important in promoting health and wellness.  Follow your health care provider's instructions about healthy diet, exercising, and getting tested or screened for diseases.  Follow your health care provider's instructions on monitoring your cholesterol and blood pressure. This information is not intended to replace advice given to you by your health care provider. Make sure you discuss any questions you have with your health care provider. Document Released: 01/15/2011 Document Revised: 06/25/2018 Document Reviewed: 06/25/2018 Elsevier Patient Education  2020 [employer].

## 2019-01-20 NOTE — Assessment & Plan Note (Signed)
Persistent iron deficiency with mild anemia despite daily iron. Will set up for iron infusion.

## 2019-01-20 NOTE — Assessment & Plan Note (Signed)
Chronic, stable. Continue hctz 12.5mg  daily.

## 2019-01-20 NOTE — Assessment & Plan Note (Signed)
Lorazepam refilled

## 2019-01-20 NOTE — Assessment & Plan Note (Signed)
concerta refilled 

## 2019-01-20 NOTE — Assessment & Plan Note (Signed)
Preventative protocols reviewed and updated unless pt declined. Discussed healthy diet and lifestyle.  

## 2019-01-21 ENCOUNTER — Ambulatory Visit
Admission: RE | Admit: 2019-01-21 | Discharge: 2019-01-21 | Disposition: A | Discharge status: Home / Self Care | Payer: BC Managed Care – PPO | Source: Ambulatory Visit | Attending: Family Medicine | Admitting: Family Medicine

## 2019-01-21 DIAGNOSIS — Z1231 Encounter for screening mammogram for malignant neoplasm of breast: Secondary | ICD-10-CM

## 2019-01-21 LAB — HM MAMMOGRAPHY

## 2019-01-21 NOTE — Telephone Encounter (Signed)
Filled and in Lisa's box 

## 2019-01-21 NOTE — Telephone Encounter (Signed)
Placed order form in Dr. G's box.  

## 2019-01-22 ENCOUNTER — Encounter: Payer: Self-pay | Admitting: Family Medicine

## 2019-01-22 ENCOUNTER — Ambulatory Visit (INDEPENDENT_AMBULATORY_CARE_PROVIDER_SITE_OTHER): Payer: BC Managed Care – PPO | Admitting: Obstetrics and Gynecology

## 2019-01-22 ENCOUNTER — Other Ambulatory Visit: Payer: Self-pay

## 2019-01-22 ENCOUNTER — Telehealth: Payer: Self-pay | Admitting: Hematology and Oncology

## 2019-01-22 ENCOUNTER — Encounter: Payer: Self-pay | Admitting: Obstetrics and Gynecology

## 2019-01-22 VITALS — BP 139/91 | HR 72 | Wt 227.2 lb

## 2019-01-22 DIAGNOSIS — Z1151 Encounter for screening for human papillomavirus (HPV): Secondary | ICD-10-CM

## 2019-01-22 DIAGNOSIS — Z01419 Encounter for gynecological examination (general) (routine) without abnormal findings: Secondary | ICD-10-CM | POA: Diagnosis not present

## 2019-01-22 DIAGNOSIS — Z124 Encounter for screening for malignant neoplasm of cervix: Secondary | ICD-10-CM

## 2019-01-22 MED ORDER — REPLENS VA GEL: 1.0000 | VAGINAL | 3 refills | Status: AC | PRN

## 2019-01-22 NOTE — Telephone Encounter (Signed)
Returned patient's phone call regarding scheduling an appointment, left a voicemail. 

## 2019-01-22 NOTE — Telephone Encounter (Signed)
Patient scheduled at [facility] for 01/26/19 and 02/02/19. Patient notified and order faxed.

## 2019-01-22 NOTE — Progress Notes (Signed)
Mammogram 01/2019

## 2019-01-22 NOTE — Telephone Encounter (Signed)
Noted.  Fwd order to [facility] to fax and schedule infusion for pt.
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
## 2019-01-26 ENCOUNTER — Other Ambulatory Visit: Payer: Self-pay

## 2019-01-26 ENCOUNTER — Ambulatory Visit (HOSPITAL_COMMUNITY)
Admission: RE | Admit: 2019-01-26 | Discharge: 2019-01-26 | Disposition: A | Discharge status: Home / Self Care | Payer: BC Managed Care – PPO | Source: Ambulatory Visit | Attending: Family Medicine | Admitting: Family Medicine

## 2019-01-26 DIAGNOSIS — K909 Intestinal malabsorption, unspecified: Secondary | ICD-10-CM | POA: Insufficient documentation

## 2019-01-26 DIAGNOSIS — E611 Iron deficiency: Secondary | ICD-10-CM | POA: Insufficient documentation

## 2019-01-26 MED ORDER — SODIUM CHLORIDE 0.9 % IV SOLN
510.0000 mg | Freq: Once | INTRAVENOUS | Status: AC
  Administered 2019-01-26: 510 mg via INTRAVENOUS
  Filled 2019-01-26: qty 17

## 2019-01-26 MED ORDER — SODIUM CHLORIDE 0.9 % IV SOLN
INTRAVENOUS | Status: DC | PRN
  Administered 2019-01-26: 250 mL via INTRAVENOUS

## 2019-01-26 NOTE — Progress Notes (Signed)
Patient received Feraheme via PIV. Observed for at least 30 minutes post infusion.Tolerated well, vitals stable, discharge instructions given, verbalized understanding. Patient alert, oriented and ambulatory at the time of discharge.  

## 2019-01-26 NOTE — Discharge Instructions (Signed)

## 2019-01-27 LAB — CYTOLOGY - PAP
Diagnosis: NEGATIVE
HPV: NOT DETECTED

## 2019-01-28 ENCOUNTER — Telehealth: Payer: Self-pay

## 2019-01-28 NOTE — Telephone Encounter (Signed)
-----   Message from Aletha Halim, MD sent at 01/28/2019 11:38 AM EDT ----- Can you let her know that her pap smear is normal and she will need another one in 3-5 years and to see if she ran into any issues with her getting her vaginal cream? Thanks!

## 2019-01-28 NOTE — Telephone Encounter (Signed)
Patient has been informed of pap smear results. She has not pick up her cream but will do this tomorrow.

## 2019-01-29 ENCOUNTER — Ambulatory Visit (INDEPENDENT_AMBULATORY_CARE_PROVIDER_SITE_OTHER): Payer: BC Managed Care – PPO | Admitting: Psychology

## 2019-01-29 DIAGNOSIS — F4310 Post-traumatic stress disorder, unspecified: Secondary | ICD-10-CM

## 2019-02-02 ENCOUNTER — Other Ambulatory Visit: Payer: Self-pay

## 2019-02-02 ENCOUNTER — Telehealth: Payer: Self-pay | Admitting: Family Medicine

## 2019-02-02 ENCOUNTER — Ambulatory Visit (HOSPITAL_COMMUNITY)
Admission: RE | Admit: 2019-02-02 | Discharge: 2019-02-02 | Disposition: A | Discharge status: Home / Self Care | Payer: BC Managed Care – PPO | Source: Ambulatory Visit | Attending: Family Medicine | Admitting: Family Medicine

## 2019-02-02 DIAGNOSIS — E611 Iron deficiency: Secondary | ICD-10-CM | POA: Diagnosis not present

## 2019-02-02 MED ORDER — SODIUM CHLORIDE 0.9 % IV SOLN
INTRAVENOUS | Status: DC | PRN
  Administered 2019-02-02: 250 mL via INTRAVENOUS

## 2019-02-02 MED ORDER — SODIUM CHLORIDE 0.9 % IV SOLN
510.0000 mg | Freq: Once | INTRAVENOUS | Status: AC
  Administered 2019-02-02: 510 mg via INTRAVENOUS
  Filled 2019-02-02: qty 17
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
## 2019-02-02 NOTE — Discharge Instructions (Signed)

## 2019-02-02 NOTE — Progress Notes (Addendum)
PATIENT CARE CENTER NOTE  Diagnosis: Iron Deficiency Anemia    Provider: [name], MD   Procedure: IV Feraheme    Note: Patient received Feraheme infusion via PIV. Tolerated well with no adverse reaction. Observed patient for 30 minutes post-infusion. Vital signs stable. Discharge instructions given. Patient alert, oriented and ambulatory at discharge.

## 2019-02-03 DIAGNOSIS — Z0279 Encounter for issue of other medical certificate: Secondary | ICD-10-CM

## 2019-02-03 NOTE — Telephone Encounter (Signed)
Filled form and in my out box.

## 2019-02-03 NOTE — Telephone Encounter (Signed)
MRI received, sent for scanning last month but still not in system.  Can we check on this? If lost will need to request new report to scan.

## 2019-02-03 NOTE — Telephone Encounter (Signed)
New MRI copy received and handed to Dr Danise Mina for scanning.

## 2019-02-04 NOTE — Telephone Encounter (Signed)
Pt aware paperwork is ready for pick up Pt stated she will turn in paperwork  Copy for pt Copy for scan Copy for billing

## 2019-02-04 NOTE — Telephone Encounter (Signed)
Left message asking pt to call office  °

## 2019-02-05 NOTE — Telephone Encounter (Signed)
Pt dropped off paperwork  She wanted her return to work date changed to 02/23/2019.  Please initial and date changes  Paperwork in dr g in box

## 2019-02-06 NOTE — Telephone Encounter (Signed)
Pt aware paperwork is ready for pick up  Pt will turn in Copy for pt Copy for scan

## 2019-02-06 NOTE — Telephone Encounter (Signed)
Form filled and in my outbox

## 2019-02-11 ENCOUNTER — Ambulatory Visit (INDEPENDENT_AMBULATORY_CARE_PROVIDER_SITE_OTHER): Payer: BC Managed Care – PPO | Admitting: Psychology

## 2019-02-11 DIAGNOSIS — F4310 Post-traumatic stress disorder, unspecified: Secondary | ICD-10-CM

## 2019-02-18 ENCOUNTER — Telehealth: Payer: Self-pay | Admitting: Family Medicine

## 2019-02-18 NOTE — Telephone Encounter (Signed)
Best number [phone]  Pt called stating her insurance company will be faxing paperwork they need additional information

## 2019-02-19 ENCOUNTER — Encounter: Payer: BC Managed Care – PPO | Admitting: Gastroenterology

## 2019-02-19 NOTE — Telephone Encounter (Signed)
Paperwork in dr g in box

## 2019-02-22 NOTE — Telephone Encounter (Signed)
Filled and in my outbox

## 2019-02-23 ENCOUNTER — Ambulatory Visit: Payer: BC Managed Care – PPO | Admitting: Psychology

## 2019-02-23 NOTE — Telephone Encounter (Signed)
Paperwork faxed °

## 2019-02-24 ENCOUNTER — Ambulatory Visit (INDEPENDENT_AMBULATORY_CARE_PROVIDER_SITE_OTHER): Payer: BC Managed Care – PPO

## 2019-02-24 ENCOUNTER — Other Ambulatory Visit: Payer: Self-pay

## 2019-02-24 DIAGNOSIS — E538 Deficiency of other specified B group vitamins: Secondary | ICD-10-CM

## 2019-02-24 MED ORDER — CYANOCOBALAMIN 1000 MCG/ML IJ SOLN
1000.0000 ug | Freq: Once | INTRAMUSCULAR | Status: AC
  Administered 2019-02-24: 1000 ug via INTRAMUSCULAR

## 2019-02-24 NOTE — Progress Notes (Signed)
Pt received #2 of 6 month B12 injections in her right deltoid. Tolerated well.

## 2019-03-04 NOTE — Telephone Encounter (Signed)
Patient aware of message below. She will come by and pick up her copy

## 2019-03-04 NOTE — Telephone Encounter (Signed)
Left message asking pt to call office  Please let pt know her paperwork was faxed on 8/10 and there is a copy here for her  Copy for pt Copy for scan

## 2019-03-09 ENCOUNTER — Ambulatory Visit: Payer: Self-pay | Admitting: Psychology

## 2019-03-12 ENCOUNTER — Ambulatory Visit (INDEPENDENT_AMBULATORY_CARE_PROVIDER_SITE_OTHER): Payer: BC Managed Care – PPO | Admitting: Psychology

## 2019-03-12 DIAGNOSIS — F4310 Post-traumatic stress disorder, unspecified: Secondary | ICD-10-CM | POA: Diagnosis not present

## 2019-03-31 ENCOUNTER — Ambulatory Visit (INDEPENDENT_AMBULATORY_CARE_PROVIDER_SITE_OTHER): Payer: BC Managed Care – PPO

## 2019-03-31 DIAGNOSIS — E538 Deficiency of other specified B group vitamins: Secondary | ICD-10-CM

## 2019-03-31 MED ORDER — CYANOCOBALAMIN 1000 MCG/ML IJ SOLN
1000.0000 ug | Freq: Once | INTRAMUSCULAR | Status: AC
  Administered 2019-03-31: 16:00:00 1000 ug via INTRAMUSCULAR

## 2019-03-31 NOTE — Progress Notes (Signed)
Pt received #3 of 6 monthly B12 injections in her left deltoid. Tolerated well.

## 2019-04-01 ENCOUNTER — Other Ambulatory Visit: Payer: Self-pay | Admitting: Family Medicine

## 2019-04-01 NOTE — Telephone Encounter (Signed)
Patient called to get a refill on her Lorazepam and Methylphenidate.  Patient needs rx sent to [facility].

## 2019-04-01 NOTE — Telephone Encounter (Addendum)
Name of Medication: Concerta, Lorazepam Name of Pharmacy: [facility] Last Fill or Written Date and Quantity:       Concerta- [id number], [id number]      Lorazepam- 01/20/19, #30 Last Office Visit and Type: 01/20/19, CPE Next Office Visit and Type: none Last Controlled Substance Agreement Date: 10/30/17 Last UDS: 10/30/17

## 2019-04-03 MED ORDER — METHYLPHENIDATE HCL ER (OSM) 18 MG PO TBCR: 18.0000 mg | EXTENDED_RELEASE_TABLET | Freq: Every day | ORAL | 0 refills | Status: DC

## 2019-04-03 MED ORDER — LORAZEPAM 0.5 MG PO TABS: 0.5000 mg | ORAL_TABLET | Freq: Two times a day (BID) | ORAL | 0 refills | Status: DC | PRN

## 2019-04-03 NOTE — Telephone Encounter (Signed)
ERx 

## 2019-04-07 ENCOUNTER — Ambulatory Visit (INDEPENDENT_AMBULATORY_CARE_PROVIDER_SITE_OTHER): Payer: BC Managed Care – PPO | Admitting: Psychology

## 2019-04-07 DIAGNOSIS — F4310 Post-traumatic stress disorder, unspecified: Secondary | ICD-10-CM | POA: Diagnosis not present

## 2019-04-09 ENCOUNTER — Ambulatory Visit (INDEPENDENT_AMBULATORY_CARE_PROVIDER_SITE_OTHER): Payer: BC Managed Care – PPO

## 2019-04-09 DIAGNOSIS — Z23 Encounter for immunization: Secondary | ICD-10-CM

## 2019-05-05 ENCOUNTER — Ambulatory Visit (INDEPENDENT_AMBULATORY_CARE_PROVIDER_SITE_OTHER): Payer: BC Managed Care – PPO

## 2019-05-05 ENCOUNTER — Ambulatory Visit (INDEPENDENT_AMBULATORY_CARE_PROVIDER_SITE_OTHER): Payer: BC Managed Care – PPO | Admitting: Psychology

## 2019-05-05 DIAGNOSIS — F4310 Post-traumatic stress disorder, unspecified: Secondary | ICD-10-CM | POA: Diagnosis not present

## 2019-05-05 DIAGNOSIS — E538 Deficiency of other specified B group vitamins: Secondary | ICD-10-CM

## 2019-05-05 MED ORDER — CYANOCOBALAMIN 1000 MCG/ML IJ SOLN
1000.0000 ug | Freq: Once | INTRAMUSCULAR | Status: AC
  Administered 2019-05-05: 15:00:00 1000 ug via INTRAMUSCULAR

## 2019-05-05 NOTE — Progress Notes (Signed)
Per orders of Dr. Danise Mina, injection of monthly B12 #4 of 6 given by Kris Mouton. Patient tolerated injection well.

## 2019-05-11 ENCOUNTER — Other Ambulatory Visit: Payer: Self-pay | Admitting: Family Medicine

## 2019-05-11 MED ORDER — LORAZEPAM 0.5 MG PO TABS: 0.5000 mg | ORAL_TABLET | Freq: Two times a day (BID) | ORAL | 0 refills | Status: DC | PRN

## 2019-05-11 MED ORDER — METHYLPHENIDATE HCL ER (OSM) 18 MG PO TBCR: 18.0000 mg | EXTENDED_RELEASE_TABLET | Freq: Every day | ORAL | 0 refills | Status: DC

## 2019-05-11 NOTE — Telephone Encounter (Signed)
Patient is requesting a refill on the following medications  Concerta Lorazepam   She is completely out of medication   [facility]

## 2019-05-11 NOTE — Telephone Encounter (Signed)
ERx 

## 2019-05-11 NOTE — Telephone Encounter (Signed)
Name of Medication: concerta 18 mg Name of [facility]acy: [facility] or Written Date and Quantity:# 30 on 04/03/19  Last Office Visit and Type: 01/20/19 annual Next Office Visit and Type: none scheduled Last Controlled Substance Agreement Date:11/04/2017  Last UDS:10/30/2017     Name of Medication: Lorazepam 0.5 mg Name of Pharmacy: [facility] or Written Date and Quantity:# 30 on 04/03/19  Last Office Visit and Type: 01/20/19 annual Next Office Visit and Type: none scheduled Last Controlled Substance Agreement Date:11/04/2017  Last UDS:10/30/2017  Per [facility] note pt is out of med.

## 2019-06-02 ENCOUNTER — Ambulatory Visit (INDEPENDENT_AMBULATORY_CARE_PROVIDER_SITE_OTHER): Payer: BC Managed Care – PPO | Admitting: Psychology

## 2019-06-02 DIAGNOSIS — F4310 Post-traumatic stress disorder, unspecified: Secondary | ICD-10-CM

## 2019-06-09 ENCOUNTER — Ambulatory Visit: Payer: BC Managed Care – PPO

## 2019-06-10 ENCOUNTER — Ambulatory Visit (INDEPENDENT_AMBULATORY_CARE_PROVIDER_SITE_OTHER): Payer: BC Managed Care – PPO | Admitting: *Deleted

## 2019-06-10 DIAGNOSIS — E538 Deficiency of other specified B group vitamins: Secondary | ICD-10-CM | POA: Diagnosis not present

## 2019-06-10 MED ORDER — CYANOCOBALAMIN 1000 MCG/ML IJ SOLN
1000.0000 ug | Freq: Once | INTRAMUSCULAR | Status: AC
  Administered 2019-06-10: 1000 ug via INTRAMUSCULAR

## 2019-06-10 NOTE — Progress Notes (Signed)
Per orders of Dr.Gutierrez, injection of Vitamin B12 given by Emireth Cockerham Simpson.  Patient tolerated injection well. 

## 2019-06-19 ENCOUNTER — Other Ambulatory Visit: Payer: Self-pay | Admitting: Family Medicine

## 2019-06-19 NOTE — Telephone Encounter (Signed)
Patient is requesting a refill on her medication She stated that the pharmacy requires her to call in for these  Lorazepam  Methylphenidate   [facility] [address] [employer]

## 2019-06-22 NOTE — Telephone Encounter (Signed)
Name of Medication: Lorazepam, Concerta Name of Pharmacy: [facility] Last Fill or Written Date and Quantity: 05/11/19      Lorazepam- [id number]      Concerta- [id number] Last Office Visit and Type: 01/20/19, CPE Next Office Visit and Type: none Last Controlled Substance Agreement Date: 10/30/17 Last UDS:  10/30/17

## 2019-06-23 MED ORDER — LORAZEPAM 0.5 MG PO TABS: 0.5000 mg | ORAL_TABLET | Freq: Two times a day (BID) | ORAL | 0 refills | Status: DC | PRN

## 2019-06-23 MED ORDER — METHYLPHENIDATE HCL ER (OSM) 18 MG PO TBCR: 18.0000 mg | EXTENDED_RELEASE_TABLET | Freq: Every day | ORAL | 0 refills | Status: DC

## 2019-06-23 NOTE — Telephone Encounter (Signed)
ERx 

## 2019-07-02 ENCOUNTER — Ambulatory Visit (INDEPENDENT_AMBULATORY_CARE_PROVIDER_SITE_OTHER): Payer: BC Managed Care – PPO | Admitting: Psychology

## 2019-07-02 DIAGNOSIS — F4310 Post-traumatic stress disorder, unspecified: Secondary | ICD-10-CM | POA: Diagnosis not present

## 2019-07-14 ENCOUNTER — Other Ambulatory Visit: Payer: Self-pay

## 2019-07-14 ENCOUNTER — Ambulatory Visit (INDEPENDENT_AMBULATORY_CARE_PROVIDER_SITE_OTHER): Payer: BC Managed Care – PPO | Admitting: *Deleted

## 2019-07-14 DIAGNOSIS — E538 Deficiency of other specified B group vitamins: Secondary | ICD-10-CM

## 2019-07-14 MED ORDER — CYANOCOBALAMIN 1000 MCG/ML IJ SOLN
1000.0000 ug | Freq: Once | INTRAMUSCULAR | Status: AC
  Administered 2019-07-14: 1000 ug via INTRAMUSCULAR

## 2019-07-14 NOTE — Progress Notes (Signed)
Per orders of Dr. Gutierrez, injection of B12 1000 mcg/mL given by Jaxsyn Catalfamo. °Patient tolerated injection well. ° °

## 2019-07-26 ENCOUNTER — Other Ambulatory Visit: Payer: Self-pay | Admitting: Family Medicine

## 2019-07-26 DIAGNOSIS — K909 Intestinal malabsorption, unspecified: Secondary | ICD-10-CM

## 2019-07-26 DIAGNOSIS — E538 Deficiency of other specified B group vitamins: Secondary | ICD-10-CM

## 2019-07-26 DIAGNOSIS — Z9884 Bariatric surgery status: Secondary | ICD-10-CM

## 2019-07-26 DIAGNOSIS — E042 Nontoxic multinodular goiter: Secondary | ICD-10-CM

## 2019-07-26 DIAGNOSIS — E559 Vitamin D deficiency, unspecified: Secondary | ICD-10-CM

## 2019-07-27 ENCOUNTER — Other Ambulatory Visit: Payer: BC Managed Care – PPO

## 2019-07-27 ENCOUNTER — Other Ambulatory Visit: Payer: Self-pay

## 2019-07-28 ENCOUNTER — Other Ambulatory Visit: Payer: BC Managed Care – PPO

## 2019-07-29 ENCOUNTER — Other Ambulatory Visit (INDEPENDENT_AMBULATORY_CARE_PROVIDER_SITE_OTHER): Payer: BC Managed Care – PPO

## 2019-07-29 ENCOUNTER — Telehealth: Payer: Self-pay | Admitting: Radiology

## 2019-07-29 DIAGNOSIS — K909 Intestinal malabsorption, unspecified: Secondary | ICD-10-CM

## 2019-07-29 DIAGNOSIS — Z9884 Bariatric surgery status: Secondary | ICD-10-CM | POA: Diagnosis not present

## 2019-07-29 DIAGNOSIS — E559 Vitamin D deficiency, unspecified: Secondary | ICD-10-CM | POA: Diagnosis not present

## 2019-07-29 LAB — CBC WITH DIFFERENTIAL/PLATELET
Basophils Absolute: 0 10*3/uL (ref 0.0–0.1)
Basophils Relative: 0.1 % (ref 0.0–3.0)
Eosinophils Absolute: 0.2 10*3/uL (ref 0.0–0.7)
Eosinophils Relative: 2 % (ref 0.0–5.0)
HCT: 43.5 % (ref 36.0–46.0)
Hemoglobin: 14.2 g/dL (ref 12.0–15.0)
Lymphocytes Relative: 16.6 % (ref 12.0–46.0)
Lymphs Abs: 1.4 10*3/uL (ref 0.7–4.0)
MCHC: 32.6 g/dL (ref 30.0–36.0)
MCV: 88.4 fl (ref 78.0–100.0)
Monocytes Absolute: 0.7 10*3/uL (ref 0.1–1.0)
Monocytes Relative: 8.2 % (ref 3.0–12.0)
Neutro Abs: 5.9 10*3/uL (ref 1.4–7.7)
Neutrophils Relative %: 73.1 % (ref 43.0–77.0)
Platelets: 341 10*3/uL (ref 150.0–400.0)
RBC: 4.92 Mil/uL (ref 3.87–5.11)
RDW: 13.2 % (ref 11.5–15.5)
WBC: 8.1 10*3/uL (ref 4.0–10.5)

## 2019-07-29 LAB — FERRITIN: Ferritin: 73.5 ng/mL (ref 10.0–291.0)

## 2019-07-29 LAB — IBC PANEL
Iron: 115 ug/dL (ref 42–145)
Saturation Ratios: 38.2 % (ref 20.0–50.0)
Transferrin: 215 mg/dL (ref 212.0–360.0)

## 2019-07-29 LAB — BASIC METABOLIC PANEL
BUN: 12 mg/dL (ref 6–23)
CO2: 27 mEq/L (ref 19–32)
Calcium: 9 mg/dL (ref 8.4–10.5)
Chloride: 104 mEq/L (ref 96–112)
Creatinine, Ser: 0.84 mg/dL (ref 0.40–1.20)
GFR: 86.62 mL/min (ref >60.00)
Glucose, Bld: 88 mg/dL (ref 70–99)
Potassium: 3.5 mEq/L (ref 3.5–5.1)
Sodium: 138 mEq/L (ref 135–145)

## 2019-07-29 LAB — VITAMIN D 25 HYDROXY (VIT D DEFICIENCY, FRACTURES): VITD: 45.05 ng/mL (ref 30.00–100.00)

## 2019-07-29 MED ORDER — LORAZEPAM 0.5 MG PO TABS: 0.5000 mg | ORAL_TABLET | Freq: Two times a day (BID) | ORAL | 0 refills | Status: DC | PRN

## 2019-07-29 MED ORDER — METHYLPHENIDATE HCL ER (OSM) 18 MG PO TBCR: 18.0000 mg | EXTENDED_RELEASE_TABLET | Freq: Every day | ORAL | 0 refills | Status: DC

## 2019-07-29 NOTE — Telephone Encounter (Signed)
ERx 

## 2019-07-29 NOTE — Telephone Encounter (Signed)
Patient is requesting medication refill for her ADHD. Lorazepam, methylphenidate

## 2019-08-01 LAB — VITAMIN B1: Vitamin B1 (Thiamine): 7 nmol/L — ABNORMAL LOW (ref 8–30)

## 2019-08-02 IMAGING — MG DIGITAL SCREENING BILATERAL MAMMOGRAM WITH TOMO AND CAD
6 of 10 series · 6 of 30 positions shown · non-contrast
Comparison: Previous exam(s).

ACR Breast Density Category a: The breast tissue is almost entirely
fatty.

CLINICAL DATA: Screening.

EXAM:
DIGITAL SCREENING BILATERAL MAMMOGRAM WITH TOMO AND CAD

[L MLO synth-2D]
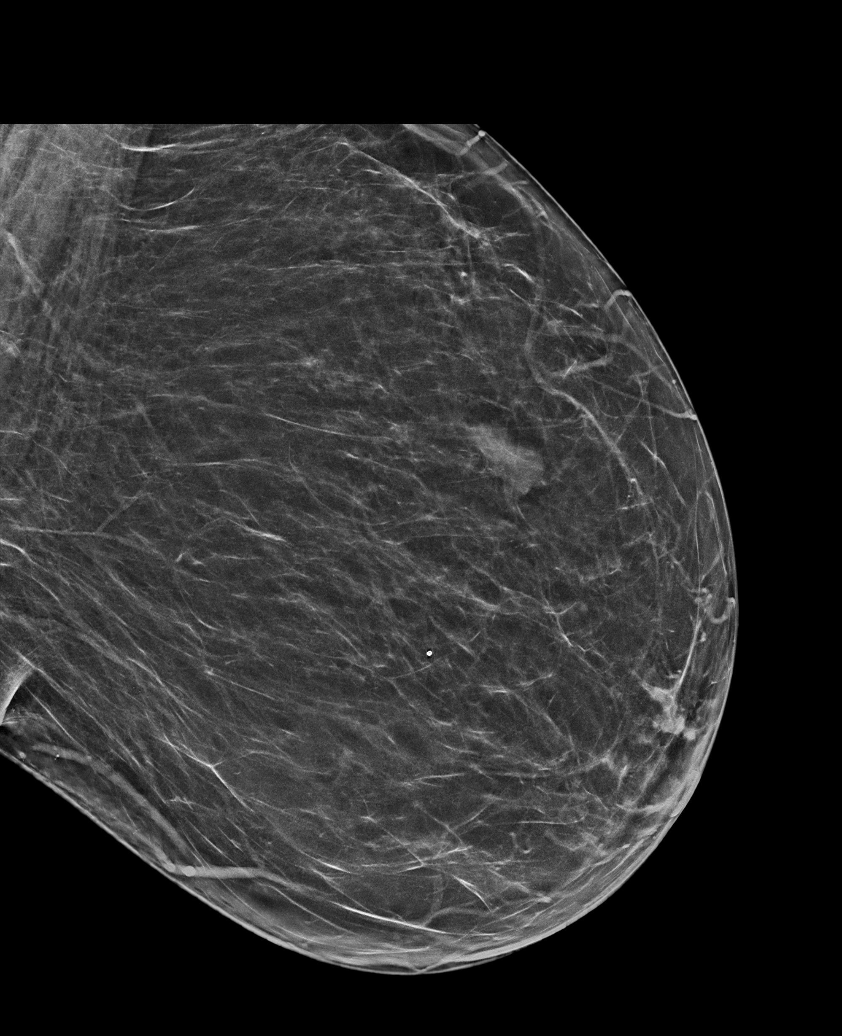

[L CC synth-2D (1 of 2)]
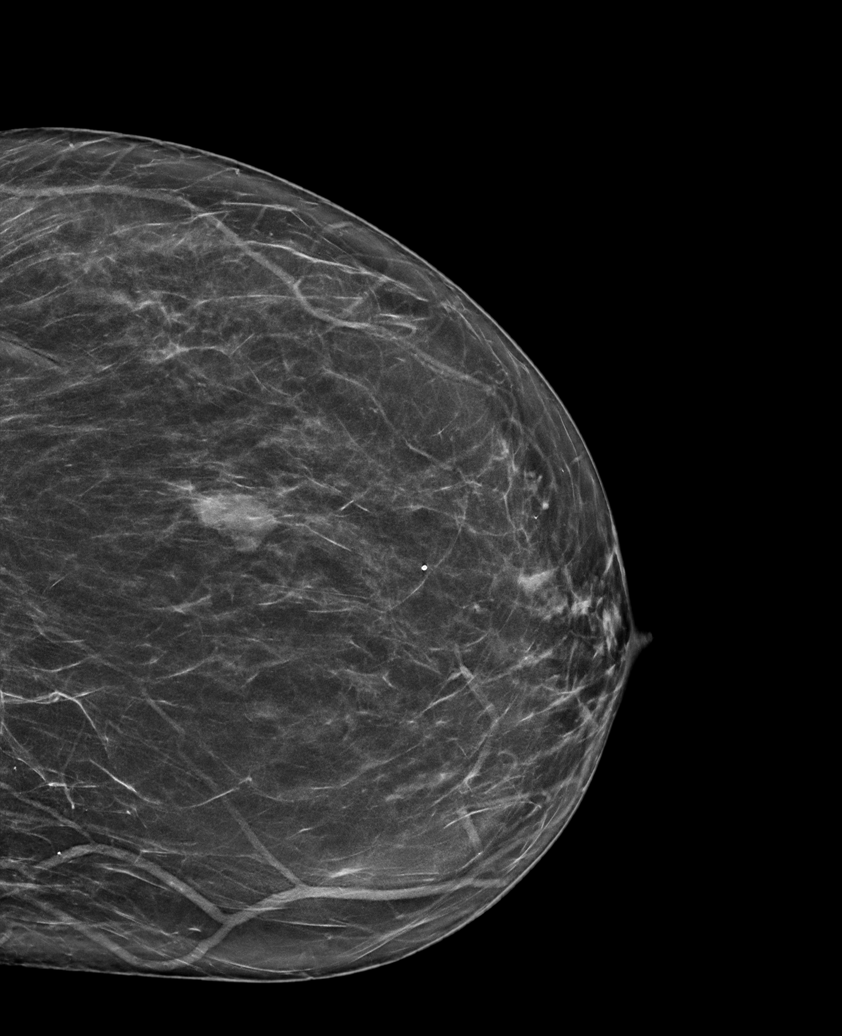

[R CC synth-2D]
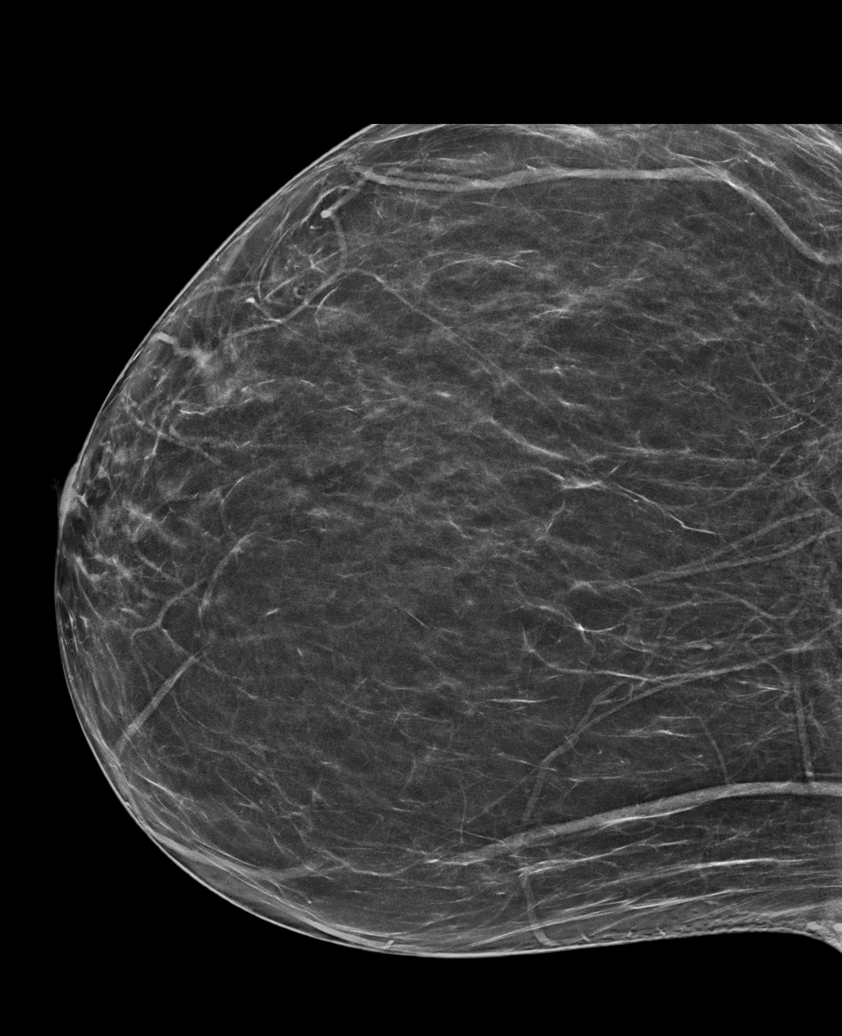

[L CC synth-2D (2 of 2)]
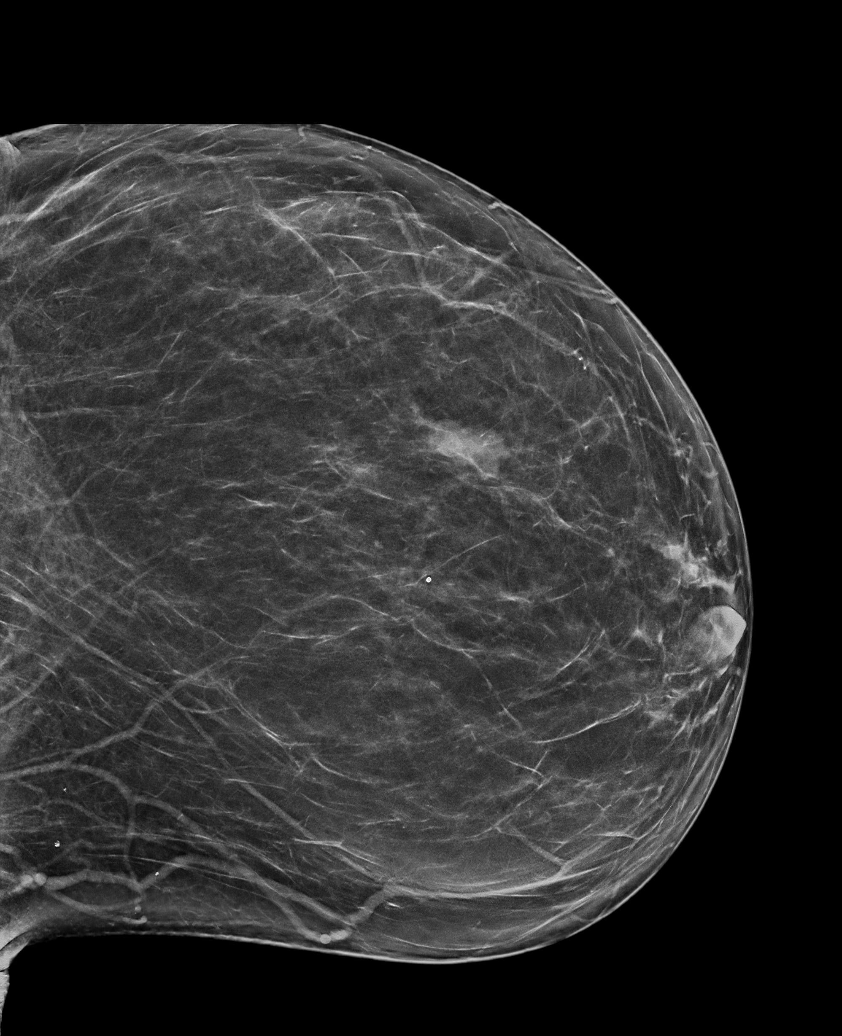

[R MLO synth-2D]
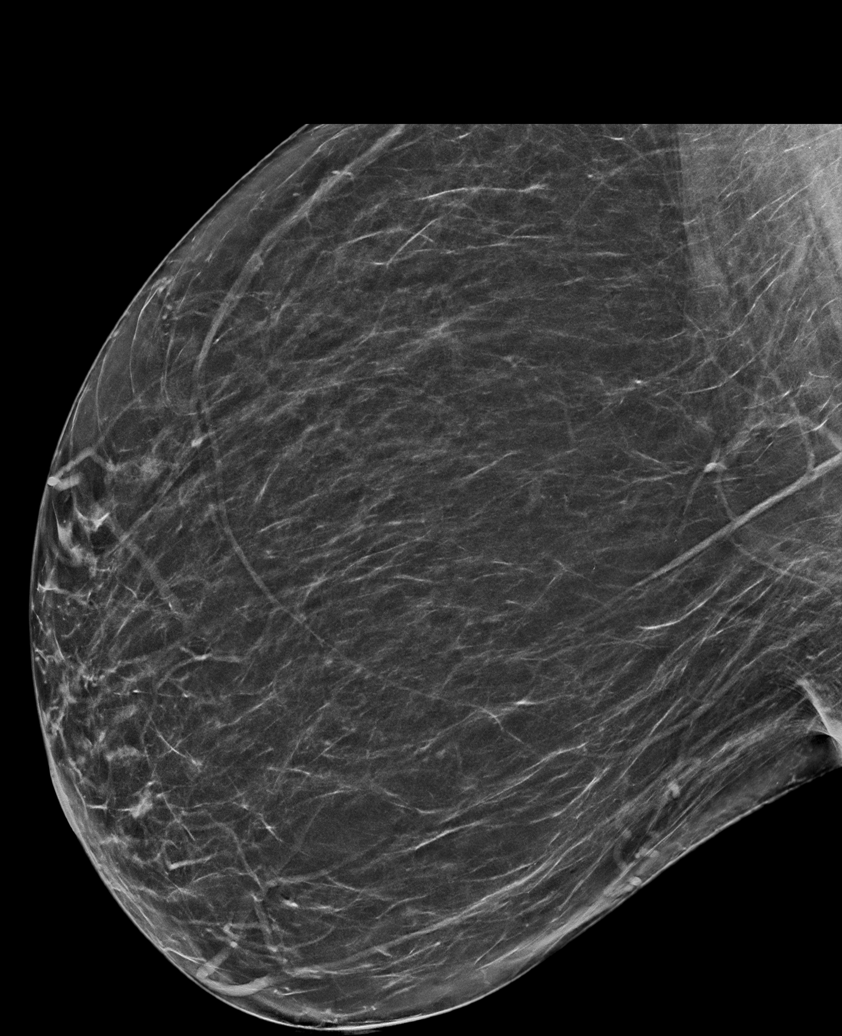

[L CC tomo · tomo slice 37/74.0]
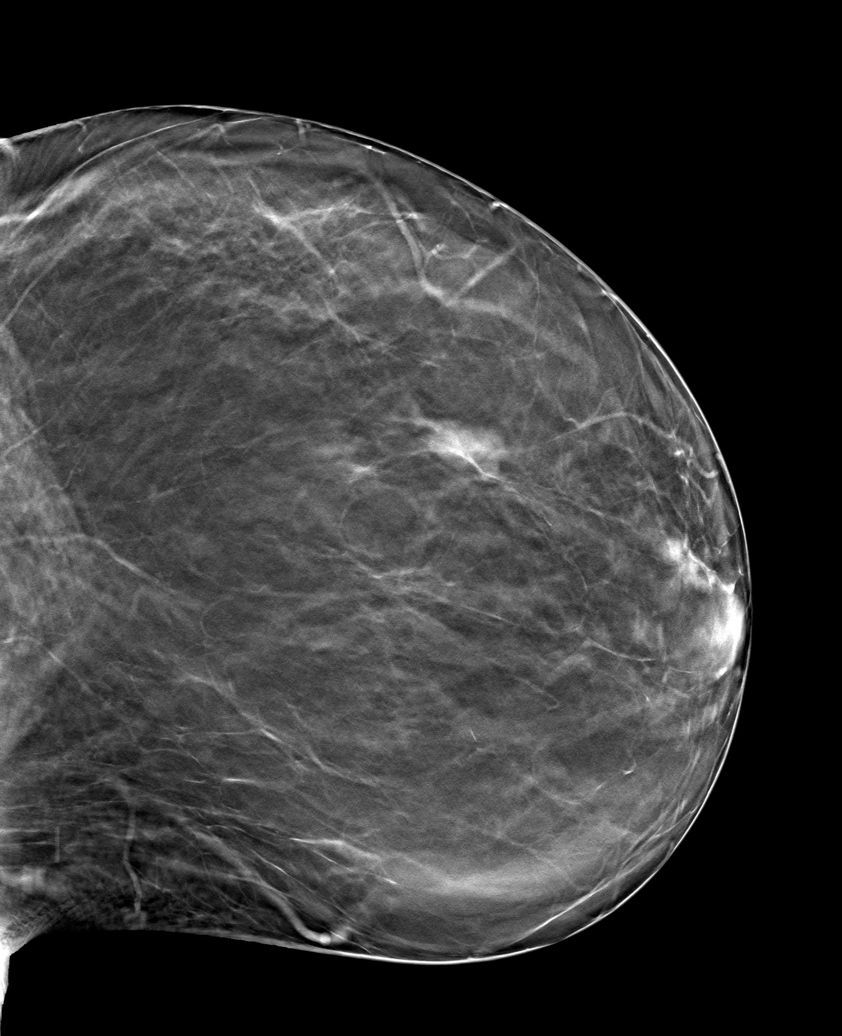

[6 of 30 positions shown; findings below may reference images not displayed]

FINDINGS: There are no findings suspicious for malignancy. Images were
processed with CAD.
IMPRESSION: No mammographic evidence of malignancy. A result letter of this
screening mammogram will be mailed directly to the patient.

RECOMMENDATION:
Screening mammogram in one year. (Code:[id number])

BI-RADS CATEGORY  1: Negative.

## 2019-08-05 ENCOUNTER — Ambulatory Visit (INDEPENDENT_AMBULATORY_CARE_PROVIDER_SITE_OTHER): Payer: BC Managed Care – PPO | Admitting: Psychology

## 2019-08-05 ENCOUNTER — Telehealth: Payer: Self-pay | Admitting: Family Medicine

## 2019-08-05 DIAGNOSIS — F4310 Post-traumatic stress disorder, unspecified: Secondary | ICD-10-CM

## 2019-08-05 NOTE — Telephone Encounter (Signed)
Patient stated that her Vitamin b1 was low.  She stated she was advised to take B1 vitamins.  But Patient called today requesting a referral to neurology .  And requested a call back       Advised patient that Dr Danise Mina is out of the office until Friday and I would get a message sent back for when he returns

## 2019-08-06 NOTE — Telephone Encounter (Signed)
plz get info on why she wants neurology referral.

## 2019-08-07 NOTE — Telephone Encounter (Signed)
LVM

## 2019-08-10 NOTE — Telephone Encounter (Signed)
Pt called in and said she is requesting the neurology referral due to her vitamin deficiency from her gastric bypass.

## 2019-08-11 ENCOUNTER — Other Ambulatory Visit: Payer: Self-pay

## 2019-08-11 ENCOUNTER — Ambulatory Visit: Payer: BC Managed Care – PPO

## 2019-08-11 NOTE — Telephone Encounter (Signed)
Unfortunately that's not really a reason to see the neurologist - as we know why she has the vitamin deficiency due to poor absorption after gastric bypass. For this reason she needs to continue thiamine replacement lifelong. Will recheck levels at next lab draw.

## 2019-08-11 NOTE — Telephone Encounter (Signed)
Left message on vm per dpr relaying Dr. G's message.  

## 2019-08-26 ENCOUNTER — Other Ambulatory Visit: Payer: Self-pay | Admitting: Family Medicine

## 2019-08-31 ENCOUNTER — Ambulatory Visit (INDEPENDENT_AMBULATORY_CARE_PROVIDER_SITE_OTHER): Payer: BC Managed Care – PPO | Admitting: Psychology

## 2019-08-31 DIAGNOSIS — F4310 Post-traumatic stress disorder, unspecified: Secondary | ICD-10-CM | POA: Diagnosis not present

## 2019-09-08 ENCOUNTER — Other Ambulatory Visit: Payer: Self-pay | Admitting: Family Medicine

## 2019-09-08 ENCOUNTER — Ambulatory Visit (INDEPENDENT_AMBULATORY_CARE_PROVIDER_SITE_OTHER): Payer: BC Managed Care – PPO | Admitting: *Deleted

## 2019-09-08 ENCOUNTER — Other Ambulatory Visit: Payer: Self-pay

## 2019-09-08 DIAGNOSIS — E538 Deficiency of other specified B group vitamins: Secondary | ICD-10-CM

## 2019-09-08 MED ORDER — METHYLPHENIDATE HCL ER (OSM) 18 MG PO TBCR: 18.0000 mg | EXTENDED_RELEASE_TABLET | Freq: Every day | ORAL | 0 refills | Status: DC

## 2019-09-08 MED ORDER — CYANOCOBALAMIN 1000 MCG/ML IJ SOLN
1000.0000 ug | Freq: Once | INTRAMUSCULAR | Status: AC
  Administered 2019-09-08: 15:00:00 1000 ug via INTRAMUSCULAR

## 2019-09-08 MED ORDER — LORAZEPAM 0.5 MG PO TABS: 0.5000 mg | ORAL_TABLET | Freq: Two times a day (BID) | ORAL | 0 refills | Status: DC | PRN

## 2019-09-08 NOTE — Telephone Encounter (Signed)
Name of Medication: Lorazepam, Methylphenidate Name of Pharmacy: [facility] Last Fill or Written Date and Quantity: 07/29/19      Lorazepam- #30      Methylphenidate- #30 Last Office Visit and Type: 01/20/19, CPE Next Office Visit and Type: none Last Controlled Substance Agreement Date: 10/30/17 Last UDS:  10/30/17

## 2019-09-08 NOTE — Telephone Encounter (Signed)
ERx 

## 2019-09-08 NOTE — Telephone Encounter (Signed)
Pt called needing to get a refill on  Methylphenidate Lorazepam  Pt is out of lorzaepam  [facility]

## 2019-09-08 NOTE — Progress Notes (Signed)
Per orders of Dr. Diona Browner in Dr Gutierrez's absence, injection of Vit B12 given by Virl Cagey. Patient tolerated injection well.

## 2019-09-28 ENCOUNTER — Ambulatory Visit (INDEPENDENT_AMBULATORY_CARE_PROVIDER_SITE_OTHER): Payer: BC Managed Care – PPO | Admitting: Psychology

## 2019-09-28 DIAGNOSIS — F4310 Post-traumatic stress disorder, unspecified: Secondary | ICD-10-CM

## 2019-10-05 ENCOUNTER — Ambulatory Visit (INDEPENDENT_AMBULATORY_CARE_PROVIDER_SITE_OTHER): Payer: BC Managed Care – PPO | Admitting: Psychology

## 2019-10-05 DIAGNOSIS — F4310 Post-traumatic stress disorder, unspecified: Secondary | ICD-10-CM | POA: Diagnosis not present

## 2019-10-06 ENCOUNTER — Other Ambulatory Visit: Payer: Self-pay

## 2019-10-06 ENCOUNTER — Ambulatory Visit (INDEPENDENT_AMBULATORY_CARE_PROVIDER_SITE_OTHER): Payer: BC Managed Care – PPO

## 2019-10-06 DIAGNOSIS — E538 Deficiency of other specified B group vitamins: Secondary | ICD-10-CM | POA: Diagnosis not present

## 2019-10-06 MED ORDER — CYANOCOBALAMIN 1000 MCG/ML IJ SOLN
1000.0000 ug | Freq: Once | INTRAMUSCULAR | Status: AC
  Administered 2019-10-06: 1000 ug via INTRAMUSCULAR

## 2019-10-06 NOTE — Progress Notes (Signed)
Per orders of Dr. Danise Mina, injection of B12 in left deltoid given by Judeth Horn. Patient tolerated injection well.

## 2019-10-19 ENCOUNTER — Ambulatory Visit (INDEPENDENT_AMBULATORY_CARE_PROVIDER_SITE_OTHER): Payer: BC Managed Care – PPO | Admitting: Psychology

## 2019-10-19 DIAGNOSIS — F4310 Post-traumatic stress disorder, unspecified: Secondary | ICD-10-CM | POA: Diagnosis not present

## 2019-10-21 ENCOUNTER — Ambulatory Visit (INDEPENDENT_AMBULATORY_CARE_PROVIDER_SITE_OTHER): Payer: BC Managed Care – PPO | Admitting: Family Medicine

## 2019-10-21 ENCOUNTER — Other Ambulatory Visit: Payer: Self-pay

## 2019-10-21 ENCOUNTER — Encounter: Payer: Self-pay | Admitting: Family Medicine

## 2019-10-21 VITALS — BP 124/80 | HR 98 | Temp 97.6°F | Ht 64.5 in | Wt 238.1 lb

## 2019-10-21 DIAGNOSIS — E538 Deficiency of other specified B group vitamins: Secondary | ICD-10-CM | POA: Diagnosis not present

## 2019-10-21 DIAGNOSIS — R300 Dysuria: Secondary | ICD-10-CM

## 2019-10-21 DIAGNOSIS — E519 Thiamine deficiency, unspecified: Secondary | ICD-10-CM | POA: Diagnosis not present

## 2019-10-21 DIAGNOSIS — K909 Intestinal malabsorption, unspecified: Secondary | ICD-10-CM

## 2019-10-21 DIAGNOSIS — E559 Vitamin D deficiency, unspecified: Secondary | ICD-10-CM | POA: Diagnosis not present

## 2019-10-21 LAB — POC URINALSYSI DIPSTICK (AUTOMATED)
Bilirubin, UA: NEGATIVE
Glucose, UA: NEGATIVE
Ketones, UA: NEGATIVE
Leukocytes, UA: NEGATIVE
Nitrite, UA: NEGATIVE
Protein, UA: NEGATIVE
Spec Grav, UA: >=1.03 — AB (ref 1.010–1.025)
Urobilinogen, UA: 0.2 E.U./dL
pH, UA: 5.5 (ref 5.0–8.0)

## 2019-10-21 NOTE — Patient Instructions (Signed)
Urine overall looking ok.  Culture sent  Labs today  Continue cranberry juice, water.  Avoid bladder irritants like spicy foods, caffeinated beverages, or dark sodas Let us know if worsening symptoms.

## 2019-10-22 DIAGNOSIS — R300 Dysuria: Secondary | ICD-10-CM | POA: Insufficient documentation

## 2019-10-22 DIAGNOSIS — E519 Thiamine deficiency, unspecified: Secondary | ICD-10-CM | POA: Insufficient documentation

## 2019-10-22 LAB — FERRITIN: Ferritin: 44.9 ng/mL (ref 10.0–291.0)

## 2019-10-22 LAB — VITAMIN B12: Vitamin B-12: 330 pg/mL (ref 211–911)

## 2019-10-22 NOTE — Assessment & Plan Note (Addendum)
Ongoing for months, worse over the past few weeks. Benign exam. UA without signs of infection, with 1+ blood however on microscopy no significant RBC noted. Check culture given ongoing symptoms. Consider rechecking UA at CPE.  Reviewed avoiding bladder irritants, increasing water intake (given concentrated urine), may continue cranberry preventatively.

## 2019-10-22 NOTE — Assessment & Plan Note (Addendum)
Update iron stores. She continues iron tablet daily.

## 2019-10-22 NOTE — Assessment & Plan Note (Signed)
Update vitamin levels.

## 2019-10-22 NOTE — Assessment & Plan Note (Signed)
Update vitamin levels. She is now regularly taking thiamine.

## 2019-10-22 NOTE — Progress Notes (Signed)
This visit was conducted in person.  BP 124/80 (BP Location: Left Arm, Patient Position: Sitting, Cuff Size: Large)   Pulse 98   Temp 97.6 F (36.4 C) (Temporal)   Ht 5' 4.5" (1.638 m)   Wt 238 lb 2 oz (108 kg)   SpO2 98%   BMI 40.24 kg/m    CC: ?UTI Subjective:    Patient ID: Theresa Pratt, female    DOB: 19-May-1971, 49 y.o.   MRN: [id number]  HPI: Theresa Pratt is a 49 y.o. female presenting on 10/21/2019 for Dysuria (C/o burning with uriantion off and on for a few months.  Tried AZO, helpful. ) and Lab Request (Pt requesting lab work to check vit levels. )   Last seen 01/2019.   Several month h/o off and on tingling feeling when urinating as well as some urinary urgency with possible incomplete emptying. More noticeable for the past few weeks. Some dysuria, frequency urgency. Some suprapubic discomfort.  She has been treating with azo, cranberry juice, water.   No fevers/chills, hematuria, flank pain or nausea/vomiting.  Remote h/o hematuria No recent strenuous activity. No fmhx blod in urine.  Non smoker. No painting fume exposure. She is around chemicals at work.   Bad cramps when on menstrual cycle. Worse with BM. H/o IBS.  LMP 2 wks ago.   Requests labs to monitor gastric bypass.  Continues monthly B12 shots.  S/p feraheme infusion x2 01/2019.      Relevant past medical, surgical, family and social history reviewed and updated as indicated. Interim medical history since our last visit reviewed. Allergies and medications reviewed and updated. Outpatient Medications Prior to Visit  Medication Sig Dispense Refill  . CANNABIDIOL PO Take by mouth 2 (two) times a week.    . cetirizine (ZYRTEC) 10 MG tablet Take 1 tablet (10 mg total) by mouth daily. 30 tablet 11  . Cholecalciferol (VITAMIN D3) 1.25 MG (50000 UT) CAPS TAKE 1 CAPSULE BY MOUTH ONE TIME PER WEEK 12 capsule 1  . hydrochlorothiazide (HYDRODIURIL) 12.5 MG tablet Take 1 tablet (12.5 mg total)  by mouth daily. 90 tablet 3  . hydrOXYzine (ATARAX/VISTARIL) 10 MG tablet Take 1 tablet (10 mg total) by mouth daily as needed for itching (hives). 30 tablet 11  . ibuprofen (ADVIL,MOTRIN) 200 MG tablet Take 200 mg by mouth daily as needed for moderate pain.     . Iron-Vitamins (GERITOL PO) Take 1 tablet by mouth daily.    . metoprolol succinate (TOPROL-XL) 50 MG 24 hr tablet Take 1 tablet (50 mg total) by mouth daily. Take with or immediately following a meal. 30 tablet 11  . Multiple Vitamin (MULTIVITAMIN) tablet Take 1 tablet by mouth daily.      . Thiamine HCl 50 MG CAPS Take 1 capsule (50 mg total) by mouth daily.  0  . LORazepam (ATIVAN) 0.5 MG tablet Take 1 tablet (0.5 mg total) by mouth 2 (two) times daily as needed for anxiety. (Patient not taking: Reported on 10/21/2019) 30 tablet 0  . methylphenidate (CONCERTA) 18 MG PO CR tablet Take 1 tablet (18 mg total) by mouth daily. (Patient not taking: Reported on 10/21/2019) 30 tablet 0   No facility-administered medications prior to visit.     Per HPI unless specifically indicated in ROS section below Review of Systems Objective:    BP 124/80 (BP Location: Left Arm, Patient Position: Sitting, Cuff Size: Large)   Pulse 98   Temp 97.6 F (36.4 C) (Temporal)  Ht 5' 4.5" (1.638 m)   Wt 238 lb 2 oz (108 kg)   SpO2 98%   BMI 40.24 kg/m   Wt Readings from Last 3 Encounters:  10/21/19 238 lb 2 oz (108 kg)  01/22/19 227 lb 3.2 oz (103.1 kg)  01/20/19 228 lb 3 oz (103.5 kg)    Physical Exam Vitals and nursing note reviewed.  Constitutional:      Appearance: Normal appearance. She is obese. She is not ill-appearing.  Abdominal:     General: Bowel sounds are normal. There is no distension.     Palpations: Abdomen is soft. There is no mass.     Tenderness: There is no abdominal tenderness. There is no right CVA tenderness, left CVA tenderness, guarding or rebound.     Hernia: No hernia is present.  Musculoskeletal:     Right lower leg:  No edema.     Left lower leg: No edema.  Skin:    Findings: No rash.  Neurological:     Mental Status: She is alert.  Psychiatric:        Mood and Affect: Mood normal.        Behavior: Behavior normal.       Results for orders placed or performed in visit on 10/21/19  POCT Urinalysis Dipstick (Automated)  Result Value Ref Range   Color, UA yellow    Clarity, UA clear    Glucose, UA Negative Negative   Bilirubin, UA negative    Ketones, UA negative    Spec Grav, UA >=1.030 (A) 1.010 - 1.025   Blood, UA 1+    pH, UA 5.5 5.0 - 8.0   Protein, UA Negative Negative   Urobilinogen, UA 0.2 0.2 or 1.0 E.U./dL   Nitrite, UA negative    Leukocytes, UA Negative Negative   Assessment & Plan:  This visit occurred during the SARS-CoV-2 public health emergency.  Safety protocols were in place, including screening questions prior to the visit, additional usage of staff PPE, and extensive cleaning of exam room while observing appropriate contact time as indicated for disinfecting solutions.   Problem List Items Addressed This Visit    Vitamin D deficiency   Vitamin B12 deficiency    Update vitamin levels.       Relevant Orders   Vitamin B12   Thiamine deficiency    Update vitamin levels. She is now regularly taking thiamine.       Relevant Orders   Vitamin B1   Iron malabsorption    Update iron stores. She continues iron tablet daily.       Relevant Orders   Ferritin   Dysuria - Primary    Ongoing for months, worse over the past few weeks. Benign exam. UA without signs of infection, with 1+ blood however on microscopy no significant RBC noted. Check culture given ongoing symptoms. Consider rechecking UA at CPE.  Reviewed avoiding bladder irritants, increasing water intake (given concentrated urine), may continue cranberry preventatively.       Relevant Orders   POCT Urinalysis Dipstick (Automated) (Completed)   Urine Culture       No orders of the defined types were placed  in this encounter.  Orders Placed This Encounter  Procedures  . Urine Culture  . Ferritin  . Vitamin B12  . Vitamin B1  . POCT Urinalysis Dipstick (Automated)    Patient Instructions  Urine overall looking ok.  Culture sent  Labs today  Continue cranberry juice, water.  Avoid bladder irritants  like spicy foods, caffeinated beverages, or dark sodas Let us know if worsening symptoms.   Follow up plan: No follow-ups on file.  [name], MD

## 2019-10-24 LAB — URINE CULTURE
MICRO NUMBER:: 10337145
SPECIMEN QUALITY:: ADEQUATE

## 2019-10-24 LAB — VITAMIN B1: Vitamin B1 (Thiamine): 17 nmol/L (ref 8–30)

## 2019-11-03 ENCOUNTER — Ambulatory Visit (INDEPENDENT_AMBULATORY_CARE_PROVIDER_SITE_OTHER): Payer: BC Managed Care – PPO | Admitting: Family Medicine

## 2019-11-03 ENCOUNTER — Telehealth: Payer: Self-pay | Admitting: *Deleted

## 2019-11-03 ENCOUNTER — Other Ambulatory Visit: Payer: Self-pay

## 2019-11-03 ENCOUNTER — Encounter: Payer: Self-pay | Admitting: Family Medicine

## 2019-11-03 VITALS — BP 138/100 | HR 85 | Temp 97.9°F | Ht 64.5 in | Wt 239.2 lb

## 2019-11-03 DIAGNOSIS — F902 Attention-deficit hyperactivity disorder, combined type: Secondary | ICD-10-CM

## 2019-11-03 DIAGNOSIS — E559 Vitamin D deficiency, unspecified: Secondary | ICD-10-CM

## 2019-11-03 DIAGNOSIS — M7989 Other specified soft tissue disorders: Secondary | ICD-10-CM | POA: Diagnosis not present

## 2019-11-03 DIAGNOSIS — I10 Essential (primary) hypertension: Secondary | ICD-10-CM

## 2019-11-03 DIAGNOSIS — K909 Intestinal malabsorption, unspecified: Secondary | ICD-10-CM

## 2019-11-03 DIAGNOSIS — E519 Thiamine deficiency, unspecified: Secondary | ICD-10-CM

## 2019-11-03 MED ORDER — DICLOFENAC SODIUM 1 % EX GEL: 2.0000 g | Freq: Three times a day (TID) | CUTANEOUS | 1 refills | Status: DC

## 2019-11-03 MED ORDER — HYDROCHLOROTHIAZIDE 12.5 MG PO TABS: 12.5000 mg | ORAL_TABLET | Freq: Every day | ORAL | 3 refills | Status: DC

## 2019-11-03 MED ORDER — METOPROLOL SUCCINATE ER 50 MG PO TB24: 50.0000 mg | ORAL_TABLET | Freq: Every day | ORAL | 3 refills | Status: DC

## 2019-11-03 MED ORDER — THIAMINE HCL 100 MG PO TABS: 100.0000 mg | ORAL_TABLET | Freq: Every day | ORAL | Status: AC

## 2019-11-03 NOTE — Assessment & Plan Note (Signed)
She continues 50000 units weekly.

## 2019-11-03 NOTE — Assessment & Plan Note (Signed)
Levels better since starting thiamine replacement.

## 2019-11-03 NOTE — Assessment & Plan Note (Signed)
Chronic, deteriorated since she's run out of medication. Restart toprol XL and hctz 12.5mg  daily.

## 2019-11-03 NOTE — Assessment & Plan Note (Addendum)
Not currently taking concerta. She feels overall better since vitamin replacements underway.

## 2019-11-03 NOTE — Assessment & Plan Note (Signed)
Notes worsening leg swelling that started with knee aches after increased activity working in the yard. Anticipate overuse of knees leading to pedal edema - stooping thiazide diuretic also likely contributing. Discussed restarting HCTZ as well as PRN voltaren to joints, will work on better hypertension control, limiting sodium in diet, increasing water intake, discussed leg elevation and compression.  Given asymmetric swelling, check D dimer and if positive will send for venous US.

## 2019-11-03 NOTE — Patient Instructions (Addendum)
Restart blood pressure medicines, watch blood pressures at home.  Drink plenty of water.  For knee pains and elbow pain, use over the counter voltaren gel (topical anti inflammatory).  Keep elevating legs D dimer blood test today - if positive, we will proceed with leg ultrasound.

## 2019-11-03 NOTE — Assessment & Plan Note (Signed)
Continues oral iron tablet daily.

## 2019-11-03 NOTE — Telephone Encounter (Signed)
Seen today. 

## 2019-11-03 NOTE — Telephone Encounter (Signed)
Patient called to schedule an appointment and was transferred to triage because of her symptoms.  Patient stated that she thinks that she may have a blood clot. Patient stated that she is concerned because she had her second Moderna vaccine 10/20/19. Patient stated on Saturday she had some heaviness and aching in her right leg.  Patent stated that her leg is swelling and the fluid moves down to her calf. Patient stated that she has been wrapping her leg and during the night the swelling goes down. Patient stated that when she gets up and on her leg during the day the swelling and achy feeling comes back. Patient denies any SOB or difficulty breathing. Patient was scheduled for an office visit today with Dr. Danise Mina today at 12:15 pr. Patient was given ER/911 precautions prior to office visit and she verbalized understanding.

## 2019-11-03 NOTE — Progress Notes (Signed)
This visit was conducted in person.  BP (!) 138/100 (BP Location: Right Arm, Patient Position: Sitting, Cuff Size: Large)   Pulse 85   Temp 97.9 F (36.6 C) (Temporal)   Ht 5' 4.5" (1.638 m)   Wt 239 lb 4 oz (108.5 kg)   LMP 11/02/2019   SpO2 97%   BMI 40.43 kg/m    CC: R>L lower leg pain and swelling Subjective:    Patient ID: Theresa Pratt, female    DOB: 1970/09/19, 49 y.o.   MRN: [id number]  HPI: Theresa Pratt is a 49 y.o. female presenting on 11/03/2019 for Leg Pain (C/o bilateral lower leg pain and swelling in right lower leg and feeling heavy. Started 11/01/19.  Recently received 2nd COVID shot 10/20/19 and was concerned. Swelling goes down when lying down.  States knee bandage and compression pants, helpful. )   3-4d h/o R>L leg swelling, more pain when bending at knee. Swelling improves at night time when she's in bed or when she elevates leg, worse as day progresses.  She's been using compression jeans.  She has been working more in the yard recently.  Initially more knee pain but now that has improved.   Denies chest pain or dyspnea.   She drove to [place] (3 hour round trip) Saturday morning.  Not on hormonal medication.  No personal or fmhx blood clots.  Currently on period   Also has some elbow soreness.   Completed Moderna covid vaccine. She ran out of BP meds  Has been taking pamprin with benefit.  Stopped other meds below except vit D3, MVI, iron and B1.      Relevant past medical, surgical, family and social history reviewed and updated as indicated. Interim medical history since our last visit reviewed. Allergies and medications reviewed and updated. Outpatient Medications Prior to Visit  Medication Sig Dispense Refill  . Cholecalciferol (VITAMIN D3) 1.25 MG (50000 UT) CAPS TAKE 1 CAPSULE BY MOUTH ONE TIME PER WEEK 12 capsule 1  . Iron-Vitamins (GERITOL PO) Take 1 tablet by mouth daily.    . Multiple Vitamin (MULTIVITAMIN) tablet Take 1  tablet by mouth daily.      . Thiamine HCl 50 MG CAPS Take 1 capsule (50 mg total) by mouth daily.  0  . hydrOXYzine (ATARAX/VISTARIL) 10 MG tablet Take 1 tablet (10 mg total) by mouth daily as needed for itching (hives). (Patient not taking: Reported on 11/03/2019) 30 tablet 11  . LORazepam (ATIVAN) 0.5 MG tablet Take 1 tablet (0.5 mg total) by mouth 2 (two) times daily as needed for anxiety. (Patient not taking: Reported on 10/21/2019) 30 tablet 0  . methylphenidate (CONCERTA) 18 MG PO CR tablet Take 1 tablet (18 mg total) by mouth daily. (Patient not taking: Reported on 10/21/2019) 30 tablet 0  . CANNABIDIOL PO Take by mouth 2 (two) times a week.    . cetirizine (ZYRTEC) 10 MG tablet Take 1 tablet (10 mg total) by mouth daily. 30 tablet 11  . hydrochlorothiazide (HYDRODIURIL) 12.5 MG tablet Take 1 tablet (12.5 mg total) by mouth daily. (Patient not taking: Reported on 11/03/2019) 90 tablet 3  . ibuprofen (ADVIL,MOTRIN) 200 MG tablet Take 200 mg by mouth daily as needed for moderate pain.     . metoprolol succinate (TOPROL-XL) 50 MG 24 hr tablet Take 1 tablet (50 mg total) by mouth daily. Take with or immediately following a meal. (Patient not taking: Reported on 11/03/2019) 30 tablet 11   No facility-administered medications  prior to visit.     Per HPI unless specifically indicated in ROS section below Review of Systems Objective:    BP (!) 138/100 (BP Location: Right Arm, Patient Position: Sitting, Cuff Size: Large)   Pulse 85   Temp 97.9 F (36.6 C) (Temporal)   Ht 5' 4.5" (1.638 m)   Wt 239 lb 4 oz (108.5 kg)   LMP 11/02/2019   SpO2 97%   BMI 40.43 kg/m   Wt Readings from Last 3 Encounters:  11/03/19 239 lb 4 oz (108.5 kg)  10/21/19 238 lb 2 oz (108 kg)  01/22/19 227 lb 3.2 oz (103.1 kg)    Physical Exam Vitals and nursing note reviewed.  Constitutional:      Appearance: Normal appearance. She is not ill-appearing.  Musculoskeletal:        General: Swelling present. No  tenderness. Normal range of motion.     Right lower leg: No edema.     Left lower leg: No edema.     Comments:  2+ DP bilaterally L calf circumference 41cm R calf circumference 43cm  Tender soft tissue swelling medial R lower leg below knee  Skin:    General: Skin is warm and dry.     Capillary Refill: Capillary refill takes less than 2 seconds.     Findings: No erythema or rash.  Neurological:     Mental Status: She is alert.  Psychiatric:        Mood and Affect: Mood normal.        Behavior: Behavior normal.       Results for orders placed or performed in visit on 10/21/19  Urine Culture   Specimen: Urine  Result Value Ref Range   MICRO NUMBER: [id number]    SPECIMEN QUALITY: Adequate    Sample Source URINE    STATUS: FINAL    ISOLATE 1:      Growth of mixed flora was isolated, suggesting probable contamination. No further testing will be performed. If clinically indicated, recollection using a method to minimize contamination, with prompt transfer to Urine Culture Transport Tube, is  recommended.   Ferritin  Result Value Ref Range   Ferritin 44.9 10.0 - 291.0 ng/mL  Vitamin B12  Result Value Ref Range   Vitamin B-12 330 211 - 911 pg/mL  Vitamin B1  Result Value Ref Range   Vitamin B1 (Thiamine) 17 8 - 30 nmol/L  POCT Urinalysis Dipstick (Automated)  Result Value Ref Range   Color, UA yellow    Clarity, UA clear    Glucose, UA Negative Negative   Bilirubin, UA negative    Ketones, UA negative    Spec Grav, UA >=1.030 (A) 1.010 - 1.025   Blood, UA 1+    pH, UA 5.5 5.0 - 8.0   Protein, UA Negative Negative   Urobilinogen, UA 0.2 0.2 or 1.0 E.U./dL   Nitrite, UA negative    Leukocytes, UA Negative Negative   Assessment & Plan:  This visit occurred during the SARS-CoV-2 public health emergency.  Safety protocols were in place, including screening questions prior to the visit, additional usage of staff PPE, and extensive cleaning of exam room while observing  appropriate contact time as indicated for disinfecting solutions.   Problem List Items Addressed This Visit    Vitamin D deficiency    She continues 50000 units weekly.       Thiamine deficiency    Levels better since starting thiamine replacement.      Leg swelling -  Primary    Notes worsening leg swelling that started with knee aches after increased activity working in the yard. Anticipate overuse of knees leading to pedal edema - stooping thiazide diuretic also likely contributing. Discussed restarting HCTZ as well as PRN voltaren to joints, will work on better hypertension control, limiting sodium in diet, increasing water intake, discussed leg elevation and compression.  Given asymmetric swelling, check D dimer and if positive will send for venous US.       Relevant Orders   D-dimer, quantitative (not at [facility])   Iron malabsorption    Continues oral iron tablet daily.       Essential hypertension    Chronic, deteriorated since she's run out of medication. Restart toprol XL and hctz 12.5mg  daily.       Relevant Medications   hydrochlorothiazide (HYDRODIURIL) 12.5 MG tablet   metoprolol succinate (TOPROL-XL) 50 MG 24 hr tablet   Attention deficit hyperactivity disorder (ADHD), combined type    Not currently taking concerta. She feels overall better since vitamin replacements underway.           Meds ordered this encounter  Medications  . hydrochlorothiazide (HYDRODIURIL) 12.5 MG tablet    Sig: Take 1 tablet (12.5 mg total) by mouth daily.    Dispense:  90 tablet    Refill:  3  . metoprolol succinate (TOPROL-XL) 50 MG 24 hr tablet    Sig: Take 1 tablet (50 mg total) by mouth daily. Take with or immediately following a meal.    Dispense:  90 tablet    Refill:  3  . thiamine 100 MG tablet    Sig: Take 1 tablet (100 mg total) by mouth daily.    Dispense:     . diclofenac Sodium (VOLTAREN) 1 % GEL    Sig: Apply 2 g topically 3 (three) times daily.    Dispense:  100 g      Refill:  1   Orders Placed This Encounter  Procedures  . D-dimer, quantitative (not at [facility])   Patient Instructions  Restart blood pressure medicines, watch blood pressures at home.  Drink plenty of water.  For knee pains and elbow pain, use over the counter voltaren gel (topical anti inflammatory).  Keep elevating legs D dimer blood test today - if positive, we will proceed with leg ultrasound.    Follow up plan: No follow-ups on file.  [name], MD

## 2019-11-04 ENCOUNTER — Telehealth: Payer: Self-pay | Admitting: *Deleted

## 2019-11-04 ENCOUNTER — Ambulatory Visit
Admission: RE | Admit: 2019-11-04 | Discharge: 2019-11-04 | Disposition: A | Discharge status: Home / Self Care | Payer: BC Managed Care – PPO | Source: Ambulatory Visit | Attending: Family Medicine | Admitting: Family Medicine

## 2019-11-04 ENCOUNTER — Telehealth: Payer: Self-pay

## 2019-11-04 DIAGNOSIS — M7989 Other specified soft tissue disorders: Secondary | ICD-10-CM

## 2019-11-04 LAB — D-DIMER, QUANTITATIVE: D-Dimer, Quant: 0.51 mcg/mL FEU — ABNORMAL HIGH (ref <0.50)

## 2019-11-04 NOTE — Telephone Encounter (Addendum)
Left message on vm per dpr relaying Dr. Synthia Innocent message below and informed her she will get a call.   Dr. Darnell Level: Noted mildly elevated D dimer.  I still don't think this is due to blood clot but given elevated D dimer, do recommend proceeding with venous imaging as discussed.

## 2019-11-04 NOTE — Telephone Encounter (Signed)
Juliann Pulse with Access nurse called to let Dr. Danise Mina know that there was a critical lab on patient. Juliann Pulse stated that the D-Dimer was reported as 0.51. Result is in Epic. Made Dr. Danise Mina aware of this immediately.

## 2019-11-04 NOTE — Telephone Encounter (Signed)
[facility] calling with venous US results.  No evidence of DVT within the right lower extremity.  Pt was informed and sent home.  FYI to Dr. Darnell Level.

## 2019-11-04 NOTE — Telephone Encounter (Signed)
Anticipate overuse of knees this past week led to pedal edema - stopping thiazide diuretic also likely contributing. rec restarting HCTZ as well as PRN voltaren to joints, work on better hypertension control, limiting sodium in diet, increasing water intake, leg elevation and compression as discussed at [place].   I don't think colonoscopy is urgent but I do recommend she try and get this scheduled.   Don't think she needs aspirin at this time. Ok to continue pamprin.

## 2019-11-04 NOTE — Telephone Encounter (Addendum)
Pt completed U/S and is wanting to know next steps.  Scan was normal per nurse at U/S -- why is D-Dimer elevated if not clots?  Pt has not had a Colonoscopy -- wants to know if maybe she needs to get that scheduled ASAP?  Has questions about her ASA 81 tab, if to continue.  Pt is taking Pamprin right now d/t menstrual pain.

## 2019-11-04 NOTE — Telephone Encounter (Signed)
Noted. Thanks.

## 2019-11-04 NOTE — Telephone Encounter (Signed)
Noted mildly elevated D dimer.  I still don't think this is due to blood clot but given elevated D dimer, do recommend proceeding with venous imaging as discussed. plz call pt - will order venous US and see if we can get done today.

## 2019-11-04 NOTE — Telephone Encounter (Signed)
[place] Night - Client TELEPHONE ADVICE RECORD [facility] Patient Name: Theresa Pratt Gender: Female DOB: 23-Jul-1970 Age: 49 Y 10 M 19 D Return Phone Number: [phone] (Primary), [phone] (Secondary) [place] [address] Client [place] Night - Client Client Site [place] Physician [name] - MD Contact Type Call Who Is Calling Lab Lab Name  Lab Phone Number [phone] Lab Tech Name Barnett Applebaum Lab Reference Number [id number] N Chief Complaint Lab Result (Critical or Stat) Call Type Lab Send to RN Reason for Call Report lab results Initial Comment Caller states Barnett Applebaum w/[facility], [phone], for pt of Dr Danise Mina. Has critical labs. Reference [id number] N Additional Comment Translation No Nurse Assessment Nurse: Rock Nephew, RN, Juliann Pulse Date/Time (Eastern Time): 11/04/2019 7:15:46 AM Is there an on-call provider listed? ---Yes Please list name of person reporting value (Lab Employee) and a contact number. ---Shirlean Mylar at [facility] [phone] Please document the following items: Lab name Lab value (read back to lab to verify) Reference range for lab value Date and time blood was drawn Collect time of birth for bilirubin results ---D-dimer critical at 0.51 Collected 11/03/2019 at 1330 Ref Range= <0.50 Guidelines Guideline Title Affirmed Question Affirmed Notes Nurse Date/Time ([place] Time) Disp. Time Eilene Ghazi Time) Disposition Final User 11/04/2019 7:15:02 AM Paged On Call back to [facility], RN, Juliann Pulse 11/04/2019 7:18:11 AM Paged On Call back to [facility], RN, Juliann Pulse 11/04/2019 7:32:15 AM Paged On Call back to [facility], RN, Juliann Pulse 11/04/2019 8:06:53 AM Send To RN [place], RN, Juliann Pulse 11/04/2019 7:17:46 AM Clinical Call Yes Rock Nephew, RN, Aldean Baker NOTE: All timestamps contained within this report are  represented as [place] Standard Time. CONFIDENTIALTY NOTICE: This fax transmission is intended only for the addressee. It contains information that is legally privileged, confidential or otherwise protected from use or disclosure. If you are not the intended recipient, you are strictly prohibited from reviewing, disclosing, copying using or disseminating any of this information or taking any action in reliance on or regarding this information. If you have received this fax in error, please notify us immediately by telephone so that we can arrange for its return to [place]. Phone: [phone], Toll-Free: [phone], Fax: [phone] Page: 2 of 2 Call Id: [id number] Comments User: Valetta Mole, RN Date/Time Eilene Ghazi Time): 11/04/2019 7:15:33 AM Unable to leave voice mail message for on call doctor , mailbox is full. User: Valetta Mole, RN Date/Time Eilene Ghazi Time): 11/04/2019 8:38:17 AM Since I was unable to reach on call provider ( Dr. Isaac Bliss) , and office now open I called the office and spoke with triage nurse Rollene Fare. I provided her with critical D-Dimer result and she stated she would pass this to Dr. Danise Mina. Paging DoctorName Phone DateTime Result/Outcome Message Type Notes Thersa Salt- MD [phone] 11/04/2019 7:15:02 AM Called on Call provider - No message left Doctor Paged Thersa Salt- MD [id number] 11/04/2019 7:32:15 AM Called on Call provider - No message left Doctor Paged Thersa Salt- MD 11/04/2019 8:38:47 AM Unable to Reach on call - Max Attempts Message Result

## 2019-11-05 ENCOUNTER — Telehealth: Payer: Self-pay

## 2019-11-05 NOTE — Telephone Encounter (Signed)
Received faxed PA denial.  Reason:  The requested drug may be covered when it is used for osteoarthritis pain in joints susceptible to topical treatment, such as feet, ankles, knees, hands wrists or elbows.  FYI to Dr. Darnell Level.

## 2019-11-05 NOTE — Telephone Encounter (Signed)
Received faxed PA request for diclofenac sodium 1% gel; key:  [id number].  Decision pending.

## 2019-11-05 NOTE — Telephone Encounter (Signed)
Left message on vm per dpr relaying Dr. G's message.  

## 2019-11-06 NOTE — Telephone Encounter (Signed)
Left message on vm per dpr notifying pt rx was denied by ins co.  Also, relayed Dr. Synthia Innocent message.

## 2019-11-06 NOTE — Telephone Encounter (Addendum)
This drug is now OTC - she may pick up at local pharmacy.  I sent it via Rx in case she could use her flex spending or HSA account for it.

## 2019-11-27 ENCOUNTER — Ambulatory Visit (INDEPENDENT_AMBULATORY_CARE_PROVIDER_SITE_OTHER): Payer: BC Managed Care – PPO | Admitting: Psychology

## 2019-11-27 DIAGNOSIS — F4310 Post-traumatic stress disorder, unspecified: Secondary | ICD-10-CM

## 2019-12-18 ENCOUNTER — Other Ambulatory Visit: Payer: Self-pay

## 2019-12-18 MED ORDER — METHYLPHENIDATE HCL ER (OSM) 18 MG PO TBCR: 18.0000 mg | EXTENDED_RELEASE_TABLET | Freq: Every day | ORAL | 0 refills | Status: DC

## 2019-12-18 MED ORDER — LORAZEPAM 0.5 MG PO TABS: 0.5000 mg | ORAL_TABLET | Freq: Two times a day (BID) | ORAL | 0 refills | Status: DC | PRN

## 2019-12-18 NOTE — Telephone Encounter (Signed)
ERx 

## 2019-12-18 NOTE — Telephone Encounter (Signed)
Patient contacted the office and states that she needs a refill on Concerta and Lorazepam. Concerta last refilled 8/64/84 for #30 with 0 refills  Lorazepam last refilled 09/08/19 for #30 with 0 refills.  Patient was last seen 11/03/19 and has no upcoming appts.  Ok to refill?

## 2019-12-23 ENCOUNTER — Ambulatory Visit (INDEPENDENT_AMBULATORY_CARE_PROVIDER_SITE_OTHER): Payer: BC Managed Care – PPO

## 2019-12-23 DIAGNOSIS — E538 Deficiency of other specified B group vitamins: Secondary | ICD-10-CM

## 2019-12-23 MED ORDER — CYANOCOBALAMIN 1000 MCG/ML IJ SOLN
1000.0000 ug | Freq: Once | INTRAMUSCULAR | Status: AC
  Administered 2019-12-23: 1000 ug via INTRAMUSCULAR

## 2019-12-23 NOTE — Progress Notes (Signed)
Consulted with Dr Danise Mina on schedule pt should be following. He stated to have her do monthly for 6 months and retest.  Pt given #1 of 6 monthly injections in left deltoid. Pt tolerated well.   I advised pt to call and schedule NV for the next few months.

## 2019-12-24 ENCOUNTER — Telehealth: Payer: Self-pay | Admitting: Family Medicine

## 2019-12-24 NOTE — Telephone Encounter (Signed)
Patient called.  Patient was given a b-12 shot yesterday.  Patient's requesting a school note for 12/16/19-12/23/19. Patient was having a hard time concentrating and focusing and trouble with her vision.  Patient received the b-12 shot and she felt much better.  Patient missed 3 assignments last week. Patient said she was able to get all of her assignments for yesterday done after the shot.

## 2019-12-27 NOTE — Telephone Encounter (Signed)
Ok to do - letter written and in chart

## 2019-12-28 NOTE — Telephone Encounter (Signed)
Patient informed and verbalized understanding.  Letter printed and placed upfront for pick up

## 2020-01-01 ENCOUNTER — Ambulatory Visit (INDEPENDENT_AMBULATORY_CARE_PROVIDER_SITE_OTHER): Payer: BC Managed Care – PPO | Admitting: Psychology

## 2020-01-01 DIAGNOSIS — F4310 Post-traumatic stress disorder, unspecified: Secondary | ICD-10-CM | POA: Diagnosis not present

## 2020-01-27 ENCOUNTER — Ambulatory Visit: Payer: BC Managed Care – PPO

## 2020-02-19 ENCOUNTER — Ambulatory Visit (INDEPENDENT_AMBULATORY_CARE_PROVIDER_SITE_OTHER): Payer: BC Managed Care – PPO | Admitting: Psychology

## 2020-02-19 DIAGNOSIS — F4310 Post-traumatic stress disorder, unspecified: Secondary | ICD-10-CM

## 2020-03-02 ENCOUNTER — Other Ambulatory Visit: Payer: Self-pay

## 2020-03-02 ENCOUNTER — Ambulatory Visit (INDEPENDENT_AMBULATORY_CARE_PROVIDER_SITE_OTHER): Payer: BC Managed Care – PPO

## 2020-03-02 DIAGNOSIS — E538 Deficiency of other specified B group vitamins: Secondary | ICD-10-CM

## 2020-03-02 MED ORDER — CYANOCOBALAMIN 1000 MCG/ML IJ SOLN
1000.0000 ug | Freq: Once | INTRAMUSCULAR | Status: AC
  Administered 2020-03-02: 1000 ug via INTRAMUSCULAR

## 2020-03-02 NOTE — Progress Notes (Signed)
Per orders of Zena Amos, NP, injection of vit B70 given by Brenton Grills. Patient tolerated injection well.

## 2020-03-08 ENCOUNTER — Other Ambulatory Visit: Payer: Self-pay | Admitting: Family Medicine

## 2020-03-08 MED ORDER — METHYLPHENIDATE HCL ER (OSM) 18 MG PO TBCR: 18.0000 mg | EXTENDED_RELEASE_TABLET | Freq: Every day | ORAL | 0 refills | Status: DC

## 2020-03-08 MED ORDER — LORAZEPAM 0.5 MG PO TABS: 0.5000 mg | ORAL_TABLET | Freq: Two times a day (BID) | ORAL | 0 refills | Status: DC | PRN

## 2020-03-08 NOTE — Telephone Encounter (Signed)
Patient called to get a refill on Lorazepam and Methylphenidate.  Patient uses [facility].  Patient's out of her medication.

## 2020-03-08 NOTE — Telephone Encounter (Signed)
Name of Medication: methylphenidate 18 mg CR Name of Pharmacy: [facility] or Written Date and Quantity: # 30 on 12/18/19 Last Office Visit and Type: 11/03/19 acute and did discuss ADHD Next Office Visit and Type: none scheduled    At the 11/03/19 visit this was included in note; Attention deficit hyperactivity disorder (ADHD), combined type     Not currently taking concerta. She feels overall better since vitamin replacements underway.            Name of Medication: lorazepam 0.5 mg Name of Pharmacy: [facility] or Written Date and Quantity: # 30 on 12/18/19 Last Office Visit and Type: 11/03/19 acute and did discuss ADHD Next Office Visit and Type: none scheduled

## 2020-03-08 NOTE — Telephone Encounter (Signed)
ERx 

## 2020-03-09 NOTE — Progress Notes (Signed)
Reviewed

## 2020-03-29 ENCOUNTER — Ambulatory Visit (INDEPENDENT_AMBULATORY_CARE_PROVIDER_SITE_OTHER): Payer: BC Managed Care – PPO | Admitting: Psychology

## 2020-03-29 DIAGNOSIS — F4310 Post-traumatic stress disorder, unspecified: Secondary | ICD-10-CM

## 2020-04-05 ENCOUNTER — Telehealth: Payer: Self-pay

## 2020-04-05 ENCOUNTER — Ambulatory Visit (INDEPENDENT_AMBULATORY_CARE_PROVIDER_SITE_OTHER): Payer: BC Managed Care – PPO | Admitting: Psychology

## 2020-04-05 DIAGNOSIS — F4310 Post-traumatic stress disorder, unspecified: Secondary | ICD-10-CM | POA: Diagnosis not present

## 2020-04-05 MED ORDER — METHYLPHENIDATE HCL ER (OSM) 18 MG PO TBCR: 18.0000 mg | EXTENDED_RELEASE_TABLET | Freq: Every day | ORAL | 0 refills | Status: DC

## 2020-04-05 MED ORDER — LORAZEPAM 0.5 MG PO TABS: 0.5000 mg | ORAL_TABLET | Freq: Two times a day (BID) | ORAL | 0 refills | Status: DC | PRN

## 2020-04-05 NOTE — Telephone Encounter (Signed)
Erx

## 2020-04-06 ENCOUNTER — Other Ambulatory Visit: Payer: Self-pay

## 2020-04-06 ENCOUNTER — Ambulatory Visit: Payer: BC Managed Care – PPO

## 2020-04-20 ENCOUNTER — Ambulatory Visit: Payer: BC Managed Care – PPO | Admitting: Psychology

## 2020-04-26 ENCOUNTER — Other Ambulatory Visit: Payer: Self-pay

## 2020-04-26 ENCOUNTER — Encounter: Payer: Self-pay | Admitting: Family Medicine

## 2020-04-26 ENCOUNTER — Telehealth: Payer: Self-pay

## 2020-04-26 ENCOUNTER — Ambulatory Visit: Payer: BC Managed Care – PPO | Admitting: Family Medicine

## 2020-04-26 VITALS — BP 140/100 | HR 94 | Temp 97.9°F | Ht 64.5 in | Wt 232.1 lb

## 2020-04-26 DIAGNOSIS — E538 Deficiency of other specified B group vitamins: Secondary | ICD-10-CM | POA: Diagnosis not present

## 2020-04-26 DIAGNOSIS — L720 Epidermal cyst: Secondary | ICD-10-CM | POA: Diagnosis not present

## 2020-04-26 DIAGNOSIS — I10 Essential (primary) hypertension: Secondary | ICD-10-CM | POA: Diagnosis not present

## 2020-04-26 DIAGNOSIS — Z23 Encounter for immunization: Secondary | ICD-10-CM | POA: Diagnosis not present

## 2020-04-26 MED ORDER — CYANOCOBALAMIN 1000 MCG/ML IJ SOLN
1000.0000 ug | Freq: Once | INTRAMUSCULAR | Status: AC
  Administered 2020-04-26: 1000 ug via INTRAMUSCULAR

## 2020-04-26 MED ORDER — DOXYCYCLINE HYCLATE 100 MG PO TABS: 100.0000 mg | ORAL_TABLET | Freq: Two times a day (BID) | ORAL | 0 refills | Status: DC

## 2020-04-26 NOTE — Patient Instructions (Addendum)
Flu shot today  I think you have infected/inflamed epidermal cyst. Treat with warm compresses to the area several times a day and antibiotic doxycycline twice daily for 1 week.  We will refer you to skin doctor for further management.  If area comes to a head, worsening redness, pain, return sooner.   Epidermal Cyst  An epidermal cyst is a sac made of skin tissue. The sac contains a substance called keratin. Keratin is a protein that is normally secreted through the hair follicles. When keratin becomes trapped in the top layer of skin (epidermis), it can form an epidermal cyst. Epidermal cysts can be found anywhere on your body. These cysts are usually harmless (benign), and they may not cause symptoms unless they become infected. What are the causes? This condition may be caused by:  A blocked hair follicle.  A hair that curls and re-enters the skin instead of growing straight out of the skin (ingrown hair).  A blocked pore.  Irritated skin.  An injury to the skin.  Certain conditions that are passed along from parent to child (inherited).  Human papillomavirus (HPV).  Long-term (chronic) sun damage to the skin. What increases the risk? The following factors may make you more likely to develop an epidermal cyst:  Having acne.  Being overweight.  Being 68-63 years old. What are the signs or symptoms? The only symptom of this condition may be a small, painless lump underneath the skin. When an epidermal cyst ruptures, it may become infected. Symptoms may include:  Redness.  Inflammation.  Tenderness.  Warmth.  Fever.  Keratin draining from the cyst. Keratin is grayish-white, bad-smelling substance.  Pus draining from the cyst. How is this diagnosed? This condition is diagnosed with a physical exam.  In some cases, you may have a sample of tissue (biopsy) taken from your cyst to be examined under a microscope or tested for bacteria.  You may be referred to a  health care provider who specializes in skin care (dermatologist). How is this treated? In many cases, epidermal cysts go away on their own without treatment. If a cyst becomes infected, treatment may include:  Opening and draining the cyst, done by a health care provider. After draining, minor surgery to remove the rest of the cyst may be done.  Antibiotic medicine.  Injections of medicines (steroids) that help to reduce inflammation.  Surgery to remove the cyst. Surgery may be done if the cyst: ? Becomes large. ? Bothers you. ? Has a chance of turning into cancer.  Do not try to open a cyst yourself. Follow these instructions at home:  Take over-the-counter and prescription medicines only as told by your health care provider.  If you were prescribed an antibiotic medicine, take it it as told by your health care provider. Do not stop using the antibiotic even if you start to feel better.  Keep the area around your cyst clean and dry.  Wear loose, dry clothing.  Avoid touching your cyst.  Check your cyst every day for signs of infection. Check for: ? Redness, swelling, or pain. ? Fluid or blood. ? Warmth. ? Pus or a bad smell.  Keep all follow-up visits as told by your health care provider. This is important. How is this prevented?  Wear clean, dry, clothing.  Avoid wearing tight clothing.  Keep your skin clean and dry. Take showers or baths every day. Contact a health care provider if:  Your cyst develops symptoms of infection.  Your condition is  not improving or is getting worse.  You develop a cyst that looks different from other cysts you have had.  You have a fever. Get help right away if:  Redness spreads from the cyst into the surrounding area. Summary  An epidermal cyst is a sac made of skin tissue. These cysts are usually harmless (benign), and they may not cause symptoms unless they become infected.  If a cyst becomes infected, treatment may include  surgery to open and drain the cyst, or to remove it. Treatment may also include medicines by mouth or through an injection.  Take over-the-counter and prescription medicines only as told by your health care provider. If you were prescribed an antibiotic medicine, take it as told by your health care provider. Do not stop using the antibiotic even if you start to feel better.  Contact a health care provider if your condition is not improving or is getting worse.  Keep all follow-up visits as told by your health care provider. This is important. This information is not intended to replace advice given to you by your health care provider. Make sure you discuss any questions you have with your health care provider. Document Revised: 10/23/2018 Document Reviewed: 01/13/2018 Elsevier Patient Education  [place].

## 2020-04-26 NOTE — Progress Notes (Signed)
This visit was conducted in person.  BP (!) 140/100 (BP Location: Left Arm, Patient Position: Sitting, Cuff Size: Large)   Pulse 94   Temp 97.9 F (36.6 C) (Temporal)   Ht 5' 4.5" (1.638 m)   Wt 232 lb 2 oz (105.3 kg)   LMP 04/15/2020   SpO2 97%   BMI 39.23 kg/m    CC: bump to back, painful Subjective:    Patient ID: Theresa Pratt, female    DOB: 1970/10/27, 49 y.o.   MRN: [id number]  HPI: Theresa Pratt is a 49 y.o. female presenting on 04/26/2020 for Mass (C/o bump on upper mid back.  Area is painful and has drained a little.  Noticed 3-4 wks ago.  Bump has increased in size and has darkened. 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   Present for ~64mo, progressively enlarging. Area has drained some, red and warm. No fevers/chills, nausea, streaking redness.   BP elevated today - despite HCTZ 12.5 and toprol XL 50mg  - didn't take last night. Increased stress at work, death in family (2nd nephew to commit suicide).      Relevant past medical, surgical, family and social history reviewed and updated as indicated. Interim medical history since our last visit reviewed. Allergies and medications reviewed and updated. Outpatient Medications Prior to Visit  Medication Sig Dispense Refill  . Cholecalciferol (VITAMIN D3) 1.25 MG (50000 UT) CAPS TAKE 1 CAPSULE BY MOUTH ONE TIME PER WEEK 12 capsule 1  . diclofenac Sodium (VOLTAREN) 1 % GEL Apply 2 g topically 3 (three) times daily. 100 g 1  . hydrochlorothiazide (HYDRODIURIL) 12.5 MG tablet Take 1 tablet (12.5 mg total) by mouth daily. 90 tablet 3  . hydrOXYzine (ATARAX/VISTARIL) 10 MG tablet Take 1 tablet (10 mg total) by mouth daily as needed for itching (hives). 30 tablet 11  . Iron-Vitamins (GERITOL PO) Take 1 tablet by mouth daily.    Marland Kitchen LORazepam (ATIVAN) 0.5 MG tablet Take 1 tablet (0.5 mg total) by mouth 2 (two) times daily as needed for anxiety. 30 tablet 0  . methylphenidate (CONCERTA) 18 MG PO CR tablet Take 1  tablet (18 mg total) by mouth daily. 30 tablet 0  . metoprolol succinate (TOPROL-XL) 50 MG 24 hr tablet Take 1 tablet (50 mg total) by mouth daily. Take with or immediately following a meal. 90 tablet 3  . Multiple Vitamin (MULTIVITAMIN) tablet Take 1 tablet by mouth daily.      Marland Kitchen thiamine 100 MG tablet Take 1 tablet (100 mg total) by mouth daily.     No facility-administered medications prior to visit.     Per HPI unless specifically indicated in ROS section below Review of Systems Objective:  BP (!) 140/100 (BP Location: Left Arm, Patient Position: Sitting, Cuff Size: Large)   Pulse 94   Temp 97.9 F (36.6 C) (Temporal)   Ht 5' 4.5" (1.638 m)   Wt 232 lb 2 oz (105.3 kg)   LMP 04/15/2020   SpO2 97%   BMI 39.23 kg/m   Wt Readings from Last 3 Encounters:  04/26/20 232 lb 2 oz (105.3 kg)  11/03/19 239 lb 4 oz (108.5 kg)  10/21/19 238 lb 2 oz (108 kg)      Physical Exam Vitals and nursing note reviewed.  Constitutional:      Appearance: Normal appearance. She is not ill-appearing.  Skin:    General: Skin is warm and dry.     Findings: Lesion present. No  erythema.          Comments: Inflamed indurated mildly fluctuant epidermal cyst to mid upper back with surrounding hyperpigmented inflammatory changes  Neurological:     Mental Status: She is alert.       Lab Results  Component Value Date   VITAMINB12 330 10/21/2019    Assessment & Plan:  This visit occurred during the SARS-CoV-2 public health emergency.  Safety protocols were in place, including screening questions prior to the visit, additional usage of staff PPE, and extensive cleaning of exam room while observing appropriate contact time as indicated for disinfecting solutions.   Problem List Items Addressed This Visit    Vitamin B12 deficiency    B12 shot today       Essential hypertension    BP elevated - she did not take her BP meds last night - agrees to take tonight.      Epidermal cyst - Primary     Anticipate inflamed/infected epidermal cyst predominantly indurated with minimal fluctuance, it did drain last week - I'm not convinced I&D today would be successful. Will treat with doxy, warm compresses for next several days. Will refer to derm for evalutaion for removal once acute inflammation has resolved. Red flags for I&D need to return discussed. Pt agrees with plan.       Relevant Orders   Ambulatory referral to Dermatology    Other Visit Diagnoses    Need for influenza vaccination       Relevant Orders   Flu Vaccine QUAD 36+ mos IM (Completed)       Meds ordered this encounter  Medications  . cyanocobalamin ((VITAMIN B-12)) injection 1,000 mcg  . doxycycline (VIBRA-TABS) 100 MG tablet    Sig: Take 1 tablet (100 mg total) by mouth 2 (two) times daily.    Dispense:  14 tablet    Refill:  0   Orders Placed This Encounter  Procedures  . Flu Vaccine QUAD 36+ mos IM  . Ambulatory referral to Dermatology    Referral Priority:   Routine    Referral Type:   Consultation    Referral Reason:   Specialty Services Required    Requested Specialty:   Dermatology    Number of Visits Requested:   1    Patient instructions: Flu shot today  I think you have infected/inflamed epidermal cyst. Treat with warm compresses to the area several times a day and antibiotic doxycycline twice daily for 1 week.  We will refer you to skin doctor for further management.  If area comes to a head, worsening redness, pain, return sooner.   Follow up plan: Return if symptoms worsen or fail to improve.  [name], MD

## 2020-04-26 NOTE — Assessment & Plan Note (Signed)
Anticipate inflamed/infected epidermal cyst predominantly indurated with minimal fluctuance, it did drain last week - I'm not convinced I&D today would be successful. Will treat with doxy, warm compresses for next several days. Will refer to derm for evalutaion for removal once acute inflammation has resolved. Red flags for I&D need to return discussed. Pt agrees with plan.

## 2020-04-26 NOTE — Assessment & Plan Note (Signed)
B12 shot today. 

## 2020-04-26 NOTE — Assessment & Plan Note (Signed)
BP elevated - she did not take her BP meds last night - agrees to take tonight.

## 2020-05-03 ENCOUNTER — Ambulatory Visit (INDEPENDENT_AMBULATORY_CARE_PROVIDER_SITE_OTHER): Payer: BC Managed Care – PPO | Admitting: Psychology

## 2020-05-03 DIAGNOSIS — F4310 Post-traumatic stress disorder, unspecified: Secondary | ICD-10-CM | POA: Diagnosis not present

## 2020-05-09 ENCOUNTER — Ambulatory Visit: Payer: BC Managed Care – PPO | Admitting: Family Medicine

## 2020-05-11 ENCOUNTER — Ambulatory Visit: Payer: BC Managed Care – PPO

## 2020-05-11 ENCOUNTER — Other Ambulatory Visit: Payer: Self-pay | Admitting: Family Medicine

## 2020-05-11 NOTE — Telephone Encounter (Signed)
Name of Medication: Lorazepam Name of Pharmacy: [facility] Last Fill or Written Date and Quantity: 04/05/20, #30 Last Office Visit and Type: 04/26/20, epidermal cyst Next Office Visit and Type: none Last Controlled Substance Agreement Date: 10/30/17 Last UDS: 10/30/17

## 2020-05-11 NOTE — Telephone Encounter (Signed)
ERROR

## 2020-05-11 NOTE — Telephone Encounter (Signed)
Pt called needed to get a refill of her prescription Lorazepam (Ativan). Please advise.  [facility] [address] [place], [place] [address]  Phone Number [phone]

## 2020-05-12 MED ORDER — LORAZEPAM 0.5 MG PO TABS: 0.5000 mg | ORAL_TABLET | Freq: Two times a day (BID) | ORAL | 0 refills | Status: DC | PRN

## 2020-05-12 NOTE — Telephone Encounter (Signed)
ERx 

## 2020-05-30 ENCOUNTER — Telehealth: Payer: Self-pay | Admitting: Family Medicine

## 2020-05-30 NOTE — Telephone Encounter (Signed)
Plz schedule in open 30 min slot but not on Thursday so we can place PPD.

## 2020-05-30 NOTE — Telephone Encounter (Signed)
Pt called in wanted to know about getting a Physical and a TB test for work in the next 20 days.

## 2020-05-31 NOTE — Telephone Encounter (Signed)
Noted  

## 2020-05-31 NOTE — Telephone Encounter (Signed)
Would you plz schedule pt on 06/06/20 for CPE in whichever slot pt prefers, per Dr. Darnell Level?  She will need fasting lab visit this week.

## 2020-05-31 NOTE — Telephone Encounter (Signed)
I left a detailed message on patient's voice mail to call back and schedule appointments.

## 2020-06-02 ENCOUNTER — Ambulatory Visit (INDEPENDENT_AMBULATORY_CARE_PROVIDER_SITE_OTHER): Payer: BC Managed Care – PPO | Admitting: Psychology

## 2020-06-02 DIAGNOSIS — F4310 Post-traumatic stress disorder, unspecified: Secondary | ICD-10-CM | POA: Diagnosis not present

## 2020-06-06 ENCOUNTER — Other Ambulatory Visit (INDEPENDENT_AMBULATORY_CARE_PROVIDER_SITE_OTHER): Payer: BC Managed Care – PPO

## 2020-06-06 ENCOUNTER — Telehealth: Payer: Self-pay

## 2020-06-06 ENCOUNTER — Other Ambulatory Visit: Payer: Self-pay

## 2020-06-06 ENCOUNTER — Ambulatory Visit (INDEPENDENT_AMBULATORY_CARE_PROVIDER_SITE_OTHER): Payer: BC Managed Care – PPO

## 2020-06-06 DIAGNOSIS — Z111 Encounter for screening for respiratory tuberculosis: Secondary | ICD-10-CM | POA: Diagnosis not present

## 2020-06-06 DIAGNOSIS — Z9884 Bariatric surgery status: Secondary | ICD-10-CM

## 2020-06-06 DIAGNOSIS — E559 Vitamin D deficiency, unspecified: Secondary | ICD-10-CM

## 2020-06-06 DIAGNOSIS — K909 Intestinal malabsorption, unspecified: Secondary | ICD-10-CM

## 2020-06-06 DIAGNOSIS — I10 Essential (primary) hypertension: Secondary | ICD-10-CM

## 2020-06-06 DIAGNOSIS — E538 Deficiency of other specified B group vitamins: Secondary | ICD-10-CM

## 2020-06-06 LAB — LIPID PANEL
Cholesterol: 166 mg/dL (ref 0–200)
HDL: 55.3 mg/dL (ref >39.00)
LDL Cholesterol: 92 mg/dL (ref 0–99)
NonHDL: 110.44
Total CHOL/HDL Ratio: 3
Triglycerides: 92 mg/dL (ref 0.0–149.0)
VLDL: 18.4 mg/dL (ref 0.0–40.0)

## 2020-06-06 LAB — COMPREHENSIVE METABOLIC PANEL
ALT: 13 U/L (ref 0–35)
AST: 17 U/L (ref 0–37)
Albumin: 3.9 g/dL (ref 3.5–5.2)
Alkaline Phosphatase: 85 U/L (ref 39–117)
BUN: 12 mg/dL (ref 6–23)
CO2: 27 mEq/L (ref 19–32)
Calcium: 9 mg/dL (ref 8.4–10.5)
Chloride: 105 mEq/L (ref 96–112)
Creatinine, Ser: 0.9 mg/dL (ref 0.40–1.20)
GFR: 74.04 mL/min (ref >60.00)
Glucose, Bld: 102 mg/dL — ABNORMAL HIGH (ref 70–99)
Potassium: 4.1 mEq/L (ref 3.5–5.1)
Sodium: 138 mEq/L (ref 135–145)
Total Bilirubin: 0.5 mg/dL (ref 0.2–1.2)
Total Protein: 7.1 g/dL (ref 6.0–8.3)

## 2020-06-06 LAB — CBC WITH DIFFERENTIAL/PLATELET
Basophils Absolute: 0 10*3/uL (ref 0.0–0.1)
Basophils Relative: 0.1 % (ref 0.0–3.0)
Eosinophils Absolute: 0.2 10*3/uL (ref 0.0–0.7)
Eosinophils Relative: 2.6 % (ref 0.0–5.0)
HCT: 42.6 % (ref 36.0–46.0)
Hemoglobin: 14 g/dL (ref 12.0–15.0)
Lymphocytes Relative: 12.6 % (ref 12.0–46.0)
Lymphs Abs: 0.9 10*3/uL (ref 0.7–4.0)
MCHC: 33 g/dL (ref 30.0–36.0)
MCV: 86.2 fl (ref 78.0–100.0)
Monocytes Absolute: 0.8 10*3/uL (ref 0.1–1.0)
Monocytes Relative: 11 % (ref 3.0–12.0)
Neutro Abs: 5.5 10*3/uL (ref 1.4–7.7)
Neutrophils Relative %: 73.7 % (ref 43.0–77.0)
Platelets: 332 10*3/uL (ref 150.0–400.0)
RBC: 4.94 Mil/uL (ref 3.87–5.11)
RDW: 13.4 % (ref 11.5–15.5)
WBC: 7.5 10*3/uL (ref 4.0–10.5)

## 2020-06-06 LAB — VITAMIN D 25 HYDROXY (VIT D DEFICIENCY, FRACTURES): VITD: 29.84 ng/mL — ABNORMAL LOW (ref 30.00–100.00)

## 2020-06-06 LAB — FERRITIN: Ferritin: 48.5 ng/mL (ref 10.0–291.0)

## 2020-06-06 LAB — IBC PANEL
Iron: 54 ug/dL (ref 42–145)
Saturation Ratios: 15.2 % — ABNORMAL LOW (ref 20.0–50.0)
Transferrin: 254 mg/dL (ref 212.0–360.0)

## 2020-06-06 LAB — FOLATE: Folate: 18.8 ng/mL (ref >5.9)

## 2020-06-06 LAB — VITAMIN B12: Vitamin B-12: 584 pg/mL (ref 211–911)

## 2020-06-06 LAB — TSH: TSH: 1.55 u[IU]/mL (ref 0.35–4.50)

## 2020-06-06 NOTE — Telephone Encounter (Signed)
Pt came in for lab visit today and has CPE on Wed, 06/08/20.  Need lab orders placed.

## 2020-06-06 NOTE — Addendum Note (Signed)
Addended by: [name] on: 06/06/2020 10:26 AM   Modules accepted: Orders

## 2020-06-07 ENCOUNTER — Encounter: Payer: BC Managed Care – PPO | Admitting: Family Medicine

## 2020-06-08 ENCOUNTER — Ambulatory Visit: Payer: BC Managed Care – PPO

## 2020-06-08 ENCOUNTER — Ambulatory Visit (INDEPENDENT_AMBULATORY_CARE_PROVIDER_SITE_OTHER): Payer: BC Managed Care – PPO | Admitting: Family Medicine

## 2020-06-08 ENCOUNTER — Other Ambulatory Visit: Payer: Self-pay

## 2020-06-08 ENCOUNTER — Encounter: Payer: Self-pay | Admitting: Family Medicine

## 2020-06-08 VITALS — BP 140/80 | HR 94 | Temp 97.9°F | Ht 65.0 in | Wt 229.1 lb

## 2020-06-08 DIAGNOSIS — Z23 Encounter for immunization: Secondary | ICD-10-CM | POA: Diagnosis not present

## 2020-06-08 DIAGNOSIS — K909 Intestinal malabsorption, unspecified: Secondary | ICD-10-CM

## 2020-06-08 DIAGNOSIS — Z0001 Encounter for general adult medical examination with abnormal findings: Secondary | ICD-10-CM | POA: Diagnosis not present

## 2020-06-08 DIAGNOSIS — Z9884 Bariatric surgery status: Secondary | ICD-10-CM

## 2020-06-08 DIAGNOSIS — F902 Attention-deficit hyperactivity disorder, combined type: Secondary | ICD-10-CM

## 2020-06-08 DIAGNOSIS — N898 Other specified noninflammatory disorders of vagina: Secondary | ICD-10-CM

## 2020-06-08 DIAGNOSIS — Z283 Underimmunization status: Secondary | ICD-10-CM | POA: Diagnosis not present

## 2020-06-08 DIAGNOSIS — E042 Nontoxic multinodular goiter: Secondary | ICD-10-CM

## 2020-06-08 DIAGNOSIS — Z2839 Other underimmunization status: Secondary | ICD-10-CM

## 2020-06-08 DIAGNOSIS — Z0279 Encounter for issue of other medical certificate: Secondary | ICD-10-CM

## 2020-06-08 DIAGNOSIS — R300 Dysuria: Secondary | ICD-10-CM

## 2020-06-08 DIAGNOSIS — F419 Anxiety disorder, unspecified: Secondary | ICD-10-CM

## 2020-06-08 DIAGNOSIS — E538 Deficiency of other specified B group vitamins: Secondary | ICD-10-CM

## 2020-06-08 DIAGNOSIS — I10 Essential (primary) hypertension: Secondary | ICD-10-CM

## 2020-06-08 DIAGNOSIS — E559 Vitamin D deficiency, unspecified: Secondary | ICD-10-CM

## 2020-06-08 DIAGNOSIS — E519 Thiamine deficiency, unspecified: Secondary | ICD-10-CM

## 2020-06-08 LAB — MICROALBUMIN / CREATININE URINE RATIO
Creatinine,U: 186.2 mg/dL
Microalb Creat Ratio: 1.1 mg/g (ref 0.0–30.0)
Microalb, Ur: 2 mg/dL — ABNORMAL HIGH (ref 0.0–1.9)

## 2020-06-08 LAB — TB SKIN TEST
Induration: 0 mm
TB Skin Test: NEGATIVE

## 2020-06-08 MED ORDER — METHYLPHENIDATE HCL ER (OSM) 18 MG PO TBCR
18.0000 mg | EXTENDED_RELEASE_TABLET | Freq: Every day | ORAL | 0 refills | Status: DC
Start: 2020-06-08 — End: 2020-07-20

## 2020-06-08 MED ORDER — ASCORBIC ACID 500 MG PO TABS: 500.0000 mg | ORAL_TABLET | Freq: Every day | ORAL | Status: AC

## 2020-06-08 MED ORDER — FLUCONAZOLE 150 MG PO TABS: 150.0000 mg | ORAL_TABLET | Freq: Once | ORAL | 0 refills | Status: AC

## 2020-06-08 MED ORDER — HYDROXYZINE HCL 10 MG PO TABS
10.0000 mg | ORAL_TABLET | Freq: Every day | ORAL | 3 refills | Status: DC | PRN
Start: 2020-06-08 — End: 2020-08-12

## 2020-06-08 MED ORDER — LORAZEPAM 0.5 MG PO TABS
0.5000 mg | ORAL_TABLET | Freq: Two times a day (BID) | ORAL | 0 refills | Status: DC | PRN
Start: 2020-06-08 — End: 2020-07-20

## 2020-06-08 MED ORDER — TURMERIC 500 MG PO CAPS: 1.0000 | ORAL_CAPSULE | Freq: Every day | ORAL | Status: DC

## 2020-06-08 MED ORDER — CALCIUM MAGNESIUM ZINC 333-133-5 MG PO TABS: 1.0000 | ORAL_TABLET | Freq: Every day | ORAL | Status: AC

## 2020-06-08 NOTE — Assessment & Plan Note (Signed)
B12 shot today. Discussed importance of regular oral daily B12 replacement.

## 2020-06-08 NOTE — Assessment & Plan Note (Signed)
Tdap updated. First shingrix today  Will check titers for MMR, varicella, Hep B prior to providing these vaccines.

## 2020-06-08 NOTE — Assessment & Plan Note (Signed)
Itchy vaginal discharge after recent antibiotic use, she had also been using topical cortisone - will Rx as presumed yeast infection with diflucan tablet x1. Update if persistent symptoms.

## 2020-06-08 NOTE — Assessment & Plan Note (Signed)
Continues intermittent iron use - encouraged be more regular with this.

## 2020-06-08 NOTE — Assessment & Plan Note (Signed)
Continues thiamine replacement regularly.

## 2020-06-08 NOTE — Assessment & Plan Note (Signed)
Lorazepam refilled -uses sparingly.

## 2020-06-08 NOTE — Progress Notes (Signed)
Patient ID: Theresa Pratt, female    DOB: Aug 19, 1970, 49 y.o.   MRN: [id number]  This visit was conducted in person.  BP 140/80 (BP Location: Left Arm, Patient Position: Sitting, Cuff Size: Large)   Pulse 94   Temp 97.9 F (36.6 C) (Temporal)   Ht 5' 5" (1.651 m)   Wt 229 lb 1 oz (103.9 kg)   LMP 05/15/2020   SpO2 98%   BMI 38.12 kg/m    Hearing Screening   125Hz 250Hz 500Hz 1000Hz 2000Hz 3000Hz 4000Hz 6000Hz 8000Hz  Right ear:   _0 Left ear:   _1 Visual Acuity Screening   Right eye Left eye Both eyes  Without correction: 20/25 20/20 20/20  With correction:       CC: CPE Subjective:   HPI: Theresa Pratt is a 49 y.o. female presenting on 06/08/2020 for Annual Exam (Has form to be completed.  Requesting Tdap and MMR. ) and Vaginal Discharge (C/o vaginal discharge and itching/pain when urinating.  Sxs started yesterday. )   1d h/o vaginal discharge - clear with external itching. Has been using OTC women's cortisone. No dysuria, urinary frequency or urgency. No abd pain. Hasn't tried OTC monistat. She did recently take abx course for infected skin cyst last month.   Feeling much better since starting new job - loose stools and cramping abd pain has largely resolved - largely stress related.   No old immunization records available.   Preventative: Colon cancer screening - did not complete colonoscopy last year ([place]) - will call to reschedule  Well woman - [place] 01/2019 pap WNL Mammogram 01/2019 Birads1 at [facility]  Flu shot yearly Td - 02/2009. Requests rpt.  COVID vaccine Moderna 09/2019, 10/2019, 05/2020 Shingrix - start series today  Seat belt use discussed  Sunscreen use discussed.No changing moles on skin.  Non smoker  Alcohol - wine socially  Dentist - q6 mo  Eye exam yearly   Lives with parents, husband, 2 children and small dog  Occupation:new job at [employer], [place]  off Activity:did join gym but no regular exercise at this time Diet: good water, fruits/vegetables daily     Relevant past medical, surgical, family and social history reviewed and updated as indicated. Interim medical history since our last visit reviewed. Allergies and medications reviewed and updated. Outpatient Medications Prior to Visit  Medication Sig Dispense Refill  . hydrochlorothiazide (HYDRODIURIL) 12.5 MG tablet Take 1 tablet (12.5 mg total) by mouth daily. 90 tablet 3  . Iron-Vitamins (GERITOL PO) Take 1 tablet by mouth daily.    . metoprolol succinate (TOPROL-XL) 50 MG 24 hr tablet Take 1 tablet (50 mg total) by mouth daily. Take with or immediately following a meal. 90 tablet 3  . Multiple Vitamin (MULTIVITAMIN) tablet Take 1 tablet by mouth daily.      Marland Kitchen thiamine 100 MG tablet Take 1 tablet (100 mg total) by mouth daily.    . hydrOXYzine (ATARAX/VISTARIL) 10 MG tablet Take 1 tablet (10 mg total) by mouth daily as needed for itching (hives). 30 tablet 11  . LORazepam (ATIVAN) 0.5 MG tablet Take 1 tablet (0.5 mg total) by mouth 2 (two) times daily as needed for anxiety. 30 tablet 0  . methylphenidate (CONCERTA) 18 MG PO CR tablet Take 1 tablet (18 mg total) by mouth daily. 30 tablet 0  . Cholecalciferol (VITAMIN D3)  1.25 MG (50000 UT) CAPS TAKE 1 CAPSULE BY MOUTH ONE TIME PER WEEK (Patient not taking: Reported on 06/08/2020) 12 capsule 1  . diclofenac Sodium (VOLTAREN) 1 % GEL Apply 2 g topically 3 (three) times daily. 100 g 1  . doxycycline (VIBRA-TABS) 100 MG tablet Take 1 tablet (100 mg total) by mouth 2 (two) times daily. 14 tablet 0   No facility-administered medications prior to visit.     Per HPI unless specifically indicated in ROS section below Review of Systems  Constitutional: Negative for activity change, appetite change, chills, fatigue, fever and unexpected weight change.  HENT: Negative for hearing loss.   Eyes: Negative for visual disturbance.   Respiratory: Negative for cough, chest tightness, shortness of breath and wheezing.   Cardiovascular: Negative for chest pain, palpitations and leg swelling.  Gastrointestinal: Negative for abdominal distention, abdominal pain, blood in stool, constipation, diarrhea, nausea and vomiting.  Genitourinary: Positive for vaginal discharge. Negative for difficulty urinating and hematuria.  Musculoskeletal: Negative for arthralgias, myalgias and neck pain.  Skin: Negative for rash.  Neurological: Negative for dizziness, seizures, syncope and headaches.  Hematological: Negative for adenopathy. Does not bruise/bleed easily.  Psychiatric/Behavioral: Negative for dysphoric mood. The patient is not nervous/anxious.    Objective:  BP 140/80 (BP Location: Left Arm, Patient Position: Sitting, Cuff Size: Large)   Pulse 94   Temp 97.9 F (36.6 C) (Temporal)   Ht $R'5\' 5"'cu$  (1.651 m)   Wt 229 lb 1 oz (103.9 kg)   LMP 05/15/2020   SpO2 98%   BMI 38.12 kg/m   Wt Readings from Last 3 Encounters:  06/08/20 229 lb 1 oz (103.9 kg)  04/26/20 232 lb 2 oz (105.3 kg)  11/03/19 239 lb 4 oz (108.5 kg)      Physical Exam Vitals and nursing note reviewed.  Constitutional:      General: She is not in acute distress.    Appearance: Normal appearance. She is well-developed. She is obese. She is not ill-appearing.  HENT:     Head: Normocephalic and atraumatic.     Right Ear: Hearing, tympanic membrane, ear canal and external ear normal.     Left Ear: Hearing, tympanic membrane, ear canal and external ear normal.  Eyes:     General: No scleral icterus.    Extraocular Movements: Extraocular movements intact.     Conjunctiva/sclera: Conjunctivae normal.     Pupils: Pupils are equal, round, and reactive to light.  Neck:     Thyroid: No thyroid mass or thyromegaly.  Cardiovascular:     Rate and Rhythm: Normal rate and regular rhythm.     Pulses: Normal pulses.          Radial pulses are 2+ on the right side and 2+  on the left side.     Heart sounds: Normal heart sounds. No murmur heard.   Pulmonary:     Effort: Pulmonary effort is normal. No respiratory distress.     Breath sounds: Normal breath sounds. No wheezing, rhonchi or rales.  Abdominal:     General: Abdomen is flat. Bowel sounds are normal. There is no distension.     Palpations: Abdomen is soft. There is no mass.     Tenderness: There is no abdominal tenderness. There is no guarding or rebound.     Hernia: No hernia is present.  Musculoskeletal:        General: Normal range of motion.     Cervical back: Normal range of motion and neck  supple.     Right lower leg: No edema.     Left lower leg: No edema.  Lymphadenopathy:     Cervical: No cervical adenopathy.  Skin:    General: Skin is warm and dry.     Findings: No rash.  Neurological:     General: No focal deficit present.     Mental Status: She is alert and oriented to person, place, and time.     Comments: CN grossly intact, station and gait intact  Psychiatric:        Mood and Affect: Mood normal.        Behavior: Behavior normal.        Thought Content: Thought content normal.        Judgment: Judgment normal.       Results for orders placed or performed in visit on 06/06/20  TB Skin Test  Result Value Ref Range   TB Skin Test Negative    Induration 0 mm   Assessment & Plan:  This visit occurred during the SARS-CoV-2 public health emergency.  Safety protocols were in place, including screening questions prior to the visit, additional usage of staff PPE, and extensive cleaning of exam room while observing appropriate contact time as indicated for disinfecting solutions.   Problem List Items Addressed This Visit    Vitamin D deficiency    Continue daily replacement.       Vitamin B12 deficiency    B12 shot today. Discussed importance of regular oral daily B12 replacement.       Vaginal discharge    Itchy vaginal discharge after recent antibiotic use, she had also  been using topical cortisone - will Rx as presumed yeast infection with diflucan tablet x1. Update if persistent symptoms.       Thiamine deficiency    Continues thiamine replacement regularly.       Severe obesity (BMI 35.0-39.9) with comorbidity ([place])    Encouraged ongoing efforts at healthy diet and lifestyle, congratulated on healthy changes to date affecting improved cholesterol levels and weight loss.       Relevant Medications   methylphenidate (CONCERTA) 18 MG PO CR tablet   Nontoxic multinodular goiter    Last [place] 2015. Exam stable today. TSH normal.       Medical certificate issuance    School employment physical form filled out.       Iron malabsorption    Continues intermittent iron use - encouraged be more regular with this.       Immunization deficiency    Tdap updated. First shingrix today  Will check titers for MMR, varicella, Hep B prior to providing these vaccines.       Relevant Orders   Hepatitis B surface antibody,qualitative   Rubeola antibody IgG   Mumps antibody, IgG   Rubella screen   Varicella zoster antibody, IgG   Essential hypertension    Chronic, stable on toprol XL and hctz - continue.       Encounter for general adult medical examination with abnormal findings - Primary    Preventative protocols reviewed and updated unless pt declined. Discussed healthy diet and lifestyle.       Dysuria   Bariatric surgery status    Labs reviewed - vitamins largely improved.       Attention deficit hyperactivity disorder (ADHD), combined type    Stable period on concerta - continue.       Anxiety    Lorazepam refilled -uses sparingly.  Relevant Medications   LORazepam (ATIVAN) 0.5 MG tablet   hydrOXYzine (ATARAX/VISTARIL) 10 MG tablet    Other Visit Diagnoses    Need for Tdap vaccination       Relevant Orders   Tdap vaccine greater than or equal to 7yo IM (Completed)   Need for shingles vaccine       Relevant Orders    Varicella-zoster vaccine IM (Completed)       Meds ordered this encounter  Medications  . LORazepam (ATIVAN) 0.5 MG tablet    Sig: Take 1 tablet (0.5 mg total) by mouth 2 (two) times daily as needed for anxiety.    Dispense:  30 tablet    Refill:  0  . methylphenidate (CONCERTA) 18 MG PO CR tablet    Sig: Take 1 tablet (18 mg total) by mouth daily.    Dispense:  30 tablet    Refill:  0  . hydrOXYzine (ATARAX/VISTARIL) 10 MG tablet    Sig: Take 1 tablet (10 mg total) by mouth daily as needed for itching (hives).    Dispense:  30 tablet    Refill:  3  . Turmeric 500 MG CAPS    Sig: Take 1 capsule by mouth daily.  . Calcium Magnesium Zinc 333-133-5 MG TABS    Sig: Take 1 tablet by mouth daily.  Marland Kitchen ascorbic acid (VITAMIN C) 500 MG tablet    Sig: Take 1 tablet (500 mg total) by mouth daily.  . fluconazole (DIFLUCAN) 150 MG tablet    Sig: Take 1 tablet (150 mg total) by mouth once for 1 dose.    Dispense:  1 tablet    Refill:  0   Orders Placed This Encounter  Procedures  . Tdap vaccine greater than or equal to 7yo IM  . Varicella-zoster vaccine IM  . Hepatitis B surface antibody,qualitative  . Rubeola antibody IgG  . Mumps antibody, IgG  . Rubella screen  . Varicella zoster antibody, IgG    Patient instructions: Hearing and vision screens today.  Call to reschedule colonoscopy.  Call to schedule mammogram at your convenience: [place] [phone] Vaccine titers today will help determine which shots we need.  Tdap and shingrix vaccine today.  Take diflucan tablet x1 for possible yeast infection.  Return as needed or in 1 year for next physical.   Follow up plan: Return in about 1 year (around 06/08/2021) for annual exam, prior fasting for blood work.  [name], MD

## 2020-06-08 NOTE — Assessment & Plan Note (Signed)
Encouraged ongoing efforts at healthy diet and lifestyle, congratulated on healthy changes to date affecting improved cholesterol levels and weight loss.

## 2020-06-08 NOTE — Assessment & Plan Note (Signed)
Stable period on concerta - continue °

## 2020-06-08 NOTE — Assessment & Plan Note (Signed)
Last [place] 2015. Exam stable today. TSH normal.

## 2020-06-08 NOTE — Assessment & Plan Note (Signed)
School employment physical form filled out.

## 2020-06-08 NOTE — Assessment & Plan Note (Signed)
Chronic, stable on toprol XL and hctz - continue.

## 2020-06-08 NOTE — Assessment & Plan Note (Signed)
Labs reviewed - vitamins largely improved.

## 2020-06-08 NOTE — Assessment & Plan Note (Signed)
Continue daily replacement 

## 2020-06-08 NOTE — Patient Instructions (Addendum)
Hearing and vision screens today.  Call to reschedule colonoscopy.  Call to schedule mammogram at your convenience: [place] [phone] Vaccine titers today will help determine which shots we need.  Tdap and shingrix vaccine today.  Take diflucan tablet x1 for possible yeast infection.  Return as needed or in 1 year for next physical.   Health Maintenance, Female Adopting a healthy lifestyle and getting preventive care are important in promoting health and wellness. Ask your health care provider about:  The right schedule for you to have regular tests and exams.  Things you can do on your own to prevent diseases and keep yourself healthy. What should I know about diet, weight, and exercise? Eat a healthy diet   Eat a diet that includes plenty of vegetables, fruits, low-fat dairy products, and lean protein.  Do not eat a lot of foods that are high in solid fats, added sugars, or sodium. Maintain a healthy weight Body mass index (BMI) is used to identify weight problems. It estimates body fat based on height and weight. Your health care provider can help determine your BMI and help you achieve or maintain a healthy weight. Get regular exercise Get regular exercise. This is one of the most important things you can do for your health. Most adults should:  Exercise for at least 150 minutes each week. The exercise should increase your heart rate and make you sweat (moderate-intensity exercise).  Do strengthening exercises at least twice a week. This is in addition to the moderate-intensity exercise.  Spend less time sitting. Even light physical activity can be beneficial. Watch cholesterol and blood lipids Have your blood tested for lipids and cholesterol at 51 years of age, then have this test every 5 years. Have your cholesterol levels checked more often if:  Your lipid or cholesterol levels are high.  You are older than 51 years of age.  You are at high risk  for heart disease. What should I know about cancer screening? Depending on your health history and family history, you may need to have cancer screening at various ages. This may include screening for:  Breast cancer.  Cervical cancer.  Colorectal cancer.  Skin cancer.  Lung cancer. What should I know about heart disease, diabetes, and high blood pressure? Blood pressure and heart disease  High blood pressure causes heart disease and increases the risk of stroke. This is more likely to develop in people who have high blood pressure readings, are of African descent, or are overweight.  Have your blood pressure checked: ? Every 3-5 years if you are 32-24 years of age. ? Every year if you are 36 years old or older. Diabetes Have regular diabetes screenings. This checks your fasting blood sugar level. Have the screening done:  Once every three years after age 75 if you are at a normal weight and have a low risk for diabetes.  More often and at a younger age if you are overweight or have a high risk for diabetes. What should I know about preventing infection? Hepatitis B If you have a higher risk for hepatitis B, you should be screened for this virus. Talk with your health care provider to find out if you are at risk for hepatitis B infection. Hepatitis C Testing is recommended for:  Everyone born from [birth year] through 1967.  Anyone with known risk factors for hepatitis C. Sexually transmitted infections (STIs)  Get screened for STIs, including gonorrhea and chlamydia, if: ? You are sexually  active and are younger than 51 years of age. ? You are older than 51 years of age and your health care provider tells you that you are at risk for this type of infection. ? Your sexual activity has changed since you were last screened, and you are at increased risk for chlamydia or gonorrhea. Ask your health care provider if you are at risk.  Ask your health care provider about whether you are  at high risk for HIV. Your health care provider may recommend a prescription medicine to help prevent HIV infection. If you choose to take medicine to prevent HIV, you should first get tested for HIV. You should then be tested every 3 months for as long as you are taking the medicine. Pregnancy  If you are about to stop having your period (premenopausal) and you may become pregnant, seek counseling before you get pregnant.  Take 400 to 800 micrograms (mcg) of folic acid every day if you become pregnant.  Ask for birth control (contraception) if you want to prevent pregnancy. Osteoporosis and menopause Osteoporosis is a disease in which the bones lose minerals and strength with aging. This can result in bone fractures. If you are 38 years old or older, or if you are at risk for osteoporosis and fractures, ask your health care provider if you should:  Be screened for bone loss.  Take a calcium or vitamin D supplement to lower your risk of fractures.  Be given hormone replacement therapy (HRT) to treat symptoms of menopause. Follow these instructions at home: Lifestyle  Do not use any products that contain nicotine or tobacco, such as cigarettes, e-cigarettes, and chewing tobacco. If you need help quitting, ask your health care provider.  Do not use street drugs.  Do not share needles.  Ask your health care provider for help if you need support or information about quitting drugs. Alcohol use  Do not drink alcohol if: ? Your health care provider tells you not to drink. ? You are pregnant, may be pregnant, or are planning to become pregnant.  If you drink alcohol: ? Limit how much you use to 0-1 drink a day. ? Limit intake if you are breastfeeding.  Be aware of how much alcohol is in your drink. In the [place] one drink equals one 12 oz bottle of beer (355 mL), one 5 oz glass of wine (148 mL), or one 1 oz glass of hard liquor (44 mL). General instructions  Schedule regular health,  dental, and eye exams.  Stay current with your vaccines.  Tell your health care provider if: ? You often feel depressed. ? You have ever been abused or do not feel safe at home. Summary  Adopting a healthy lifestyle and getting preventive care are important in promoting health and wellness.  Follow your health care provider's instructions about healthy diet, exercising, and getting tested or screened for diseases.  Follow your health care provider's instructions on monitoring your cholesterol and blood pressure. This information is not intended to replace advice given to you by your health care provider. Make sure you discuss any questions you have with your health care provider. Document Revised: 06/25/2018 Document Reviewed: 06/25/2018 Elsevier Patient Education  2020 [employer].

## 2020-06-08 NOTE — Assessment & Plan Note (Signed)
Preventative protocols reviewed and updated unless pt declined. Discussed healthy diet and lifestyle.  

## 2020-06-10 LAB — HEPATITIS B SURFACE ANTIBODY,QUALITATIVE: Hep B S Ab: NONREACTIVE

## 2020-06-10 LAB — RUBEOLA ANTIBODY IGG: Rubeola IgG: 64.4 AU/mL

## 2020-06-10 LAB — MUMPS ANTIBODY, IGG: Mumps IgG: >300 AU/mL

## 2020-06-10 LAB — RUBELLA SCREEN: Rubella: 1.15 Index

## 2020-06-10 LAB — VARICELLA ZOSTER ANTIBODY, IGG: Varicella IgG: 545.8 index

## 2020-06-13 LAB — VITAMIN B1: Vitamin B1 (Thiamine): 9 nmol/L (ref 8–30)

## 2020-06-15 ENCOUNTER — Encounter: Payer: BC Managed Care – PPO | Admitting: Family Medicine

## 2020-06-15 ENCOUNTER — Ambulatory Visit (INDEPENDENT_AMBULATORY_CARE_PROVIDER_SITE_OTHER): Payer: BC Managed Care – PPO

## 2020-06-15 ENCOUNTER — Other Ambulatory Visit: Payer: Self-pay

## 2020-06-15 DIAGNOSIS — Z23 Encounter for immunization: Secondary | ICD-10-CM | POA: Diagnosis not present

## 2020-07-02 ENCOUNTER — Other Ambulatory Visit: Payer: Self-pay | Admitting: Family Medicine

## 2020-07-04 ENCOUNTER — Ambulatory Visit: Payer: BC Managed Care – PPO | Admitting: Psychology

## 2020-07-20 ENCOUNTER — Ambulatory Visit (INDEPENDENT_AMBULATORY_CARE_PROVIDER_SITE_OTHER): Payer: BC Managed Care – PPO

## 2020-07-20 ENCOUNTER — Telehealth: Payer: Self-pay | Admitting: Family Medicine

## 2020-07-20 DIAGNOSIS — E538 Deficiency of other specified B group vitamins: Secondary | ICD-10-CM | POA: Diagnosis not present

## 2020-07-20 MED ORDER — METHYLPHENIDATE HCL ER (OSM) 18 MG PO TBCR
18.0000 mg | EXTENDED_RELEASE_TABLET | Freq: Every day | ORAL | 0 refills | Status: DC
Start: 2020-07-20 — End: 2020-08-23

## 2020-07-20 MED ORDER — LORAZEPAM 0.5 MG PO TABS
0.5000 mg | ORAL_TABLET | Freq: Two times a day (BID) | ORAL | 0 refills | Status: DC | PRN
Start: 2020-07-20 — End: 2020-08-23

## 2020-07-20 MED ORDER — CYANOCOBALAMIN 1000 MCG/ML IJ SOLN
1000.0000 ug | Freq: Once | INTRAMUSCULAR | Status: AC
  Administered 2020-07-20: 1000 ug via INTRAMUSCULAR

## 2020-07-20 NOTE — Telephone Encounter (Signed)
Medication Refill - Medication:  methylphenidate (CONCERTA) 18 MG PO CR tablet  LORazepam (ATIVAN) 0.5 MG tablet    Has the patient contacted their pharmacy?  No.  Preferred Pharmacy (with phone number or street name):  [id number] - [place], [place] - [address] Phone:  [phone]  Fax:  [phone]

## 2020-07-20 NOTE — Progress Notes (Signed)
Per orders of Dr. Gutierrez, injection of B12 given by Redith Drach G Eleri Ruben. Patient tolerated injection well.  

## 2020-07-20 NOTE — Telephone Encounter (Signed)
ERx 

## 2020-08-11 ENCOUNTER — Other Ambulatory Visit: Payer: Self-pay | Admitting: Family Medicine

## 2020-08-11 NOTE — Telephone Encounter (Signed)
Ok to send 90-day rx?

## 2020-08-16 ENCOUNTER — Other Ambulatory Visit: Payer: Self-pay

## 2020-08-16 ENCOUNTER — Ambulatory Visit: Payer: BC Managed Care – PPO

## 2020-08-22 ENCOUNTER — Other Ambulatory Visit: Payer: Self-pay | Admitting: Family Medicine

## 2020-08-22 NOTE — Telephone Encounter (Signed)
Pt called in wantd to know about getting lorazepam and concerta

## 2020-08-23 NOTE — Telephone Encounter (Signed)
Name of Medication: Lorazepam, Concerta Name of Pharmacy: [facility] Last Fill or Written Date and Quantity: 07/20/20, #30 Last Office Visit and Type: 06/08/20, CPE Next Office Visit and Type: 07/07/21, CPE Last Controlled Substance Agreement Date: 10/30/17 Last UDS: 10/30/17

## 2020-08-25 ENCOUNTER — Ambulatory Visit (INDEPENDENT_AMBULATORY_CARE_PROVIDER_SITE_OTHER): Payer: BC Managed Care – PPO

## 2020-08-25 ENCOUNTER — Telehealth: Payer: Self-pay | Admitting: Family Medicine

## 2020-08-25 ENCOUNTER — Other Ambulatory Visit: Payer: Self-pay

## 2020-08-25 DIAGNOSIS — Z23 Encounter for immunization: Secondary | ICD-10-CM | POA: Diagnosis not present

## 2020-08-25 MED ORDER — LORAZEPAM 0.5 MG PO TABS: 0.5000 mg | ORAL_TABLET | Freq: Two times a day (BID) | ORAL | 0 refills | Status: DC | PRN

## 2020-08-25 MED ORDER — METHYLPHENIDATE HCL ER (OSM) 18 MG PO TBCR
18.0000 mg | EXTENDED_RELEASE_TABLET | Freq: Every day | ORAL | 0 refills | Status: DC
Start: 2020-08-25 — End: 2020-10-07

## 2020-08-25 NOTE — Telephone Encounter (Signed)
Patient is calling back again about her medications. She is currently out of the medications and in need of them . EM

## 2020-08-25 NOTE — Telephone Encounter (Signed)
ERx 

## 2020-08-25 NOTE — Telephone Encounter (Signed)
Patient called in again stating she is still waiting and needing her medications. Please advise.

## 2020-08-25 NOTE — Telephone Encounter (Addendum)
Spoke with pt informing her she is overdue for 2nd dose of hep A/B.  Pt scheduled NV 08/30/20 at 9:00.    Also, scheduled vit B12 inj.

## 2020-08-25 NOTE — Telephone Encounter (Signed)
Left message on vm per dpr notifying pt refills were sent in.   

## 2020-08-25 NOTE — Telephone Encounter (Signed)
Error

## 2020-08-25 NOTE — Telephone Encounter (Signed)
Pt called in wanted to know if she is up tp date with her immunization

## 2020-08-30 ENCOUNTER — Ambulatory Visit (INDEPENDENT_AMBULATORY_CARE_PROVIDER_SITE_OTHER): Payer: BC Managed Care – PPO

## 2020-08-30 DIAGNOSIS — Z23 Encounter for immunization: Secondary | ICD-10-CM | POA: Diagnosis not present

## 2020-08-30 DIAGNOSIS — E538 Deficiency of other specified B group vitamins: Secondary | ICD-10-CM | POA: Diagnosis not present

## 2020-08-30 MED ORDER — CYANOCOBALAMIN 1000 MCG/ML IJ SOLN
1000.0000 ug | Freq: Once | INTRAMUSCULAR | Status: AC
  Administered 2020-08-30: 1000 ug via INTRAMUSCULAR

## 2020-08-30 NOTE — Progress Notes (Signed)
Per orders of Dr. Danise Mina, injection of Hep A/B and Vitamin B12, given by Aneta Mins, RN. Patient tolerated injections well.

## 2020-10-07 ENCOUNTER — Other Ambulatory Visit: Payer: Self-pay | Admitting: Family Medicine

## 2020-10-07 NOTE — Telephone Encounter (Signed)
  LAST APPOINTMENT DATE: 08/30/2020   NEXT APPOINTMENT DATE:@Visit  date not found  MEDICATION: Lorazepam and Methylphenidate   PHARMACY: [facility]  Let patient know to contact pharmacy at the end of the day to make sure medication is ready.  Please notify patient to allow 48-72 hours to process  Encourage patient to contact the pharmacy for refills or they can request refills through [place]:   LAST REFILL:  QTY:  REFILL DATE:    OTHER COMMENTS:    Okay for refill?  Please advise

## 2020-10-07 NOTE — Telephone Encounter (Signed)
Name of Medication: Lorazepam, Methylphenidate Name of Pharmacy: [facility] Last Fill or Written Date and Quantity: 08/25/20      Lorazepam- #30      Methylphenidate- #30 Last Office Visit and Type: 06/08/20, CPE Next Office Visit and Type: 07/07/21, CPE Last Controlled Substance Agreement Date: 10/30/17 Last UDS: 10/30/17

## 2020-10-07 NOTE — Addendum Note (Signed)
Addended by: Brenton Grills on: 4/94/4739 58:44 PM   Modules accepted: Orders

## 2020-10-11 MED ORDER — LORAZEPAM 0.5 MG PO TABS: 0.5000 mg | ORAL_TABLET | Freq: Two times a day (BID) | ORAL | 0 refills | Status: DC | PRN

## 2020-10-11 MED ORDER — METHYLPHENIDATE HCL ER (OSM) 18 MG PO TBCR: 18.0000 mg | EXTENDED_RELEASE_TABLET | Freq: Every day | ORAL | 0 refills | Status: DC

## 2020-10-11 NOTE — Telephone Encounter (Signed)
ERx 

## 2020-11-15 ENCOUNTER — Telehealth: Payer: Self-pay | Admitting: Family Medicine

## 2020-11-15 MED ORDER — LORAZEPAM 0.5 MG PO TABS: 0.5000 mg | ORAL_TABLET | Freq: Two times a day (BID) | ORAL | 0 refills | Status: DC | PRN

## 2020-11-15 MED ORDER — METHYLPHENIDATE HCL ER (OSM) 18 MG PO TBCR: 18.0000 mg | EXTENDED_RELEASE_TABLET | Freq: Every day | ORAL | 0 refills | Status: DC

## 2020-11-15 NOTE — Telephone Encounter (Signed)
ERx 

## 2020-11-15 NOTE — Telephone Encounter (Signed)
  LAST APPOINTMENT DATE: 10/07/2020   NEXT APPOINTMENT DATE:@Visit  date not found  MEDICATION: ativan, concerta  [facility]ACY[facility] [address]  Let patient know to contact pharmacy at the end of the day to make sure medication is ready.  Please notify patient to allow 48-72 hours to process  Encourage patient to contact the pharmacy for refills or they can request refills through [place]:   LAST REFILL:  QTY:  REFILL DATE:    OTHER COMMENTS:    Okay for refill?  Please advise

## 2020-11-15 NOTE — Telephone Encounter (Signed)
Name of Medication: Ativan and Concerta Name of Pharmacy: [facility] Last Fill or Written Date and Quantity: Ativan 1/50/56 #97, Concerta 9/48/01 #65 Last Office Visit and Type: 06/08/20 Next Office Visit and Type: 07/07/21 Last Controlled Substance Agreement Date: 10/30/17 Last UDS: 10/30/17

## 2020-12-03 ENCOUNTER — Other Ambulatory Visit: Payer: Self-pay

## 2020-12-03 ENCOUNTER — Encounter: Payer: Self-pay | Admitting: Emergency Medicine

## 2020-12-03 ENCOUNTER — Ambulatory Visit
Admission: EM | Admit: 2020-12-03 | Discharge: 2020-12-03 | Disposition: A | Discharge status: Home / Self Care | Payer: BC Managed Care – PPO | Attending: Family Medicine | Admitting: Family Medicine

## 2020-12-03 DIAGNOSIS — L500 Allergic urticaria: Secondary | ICD-10-CM | POA: Diagnosis not present

## 2020-12-03 MED ORDER — FAMOTIDINE 20 MG PO TABS: 20.0000 mg | ORAL_TABLET | Freq: Two times a day (BID) | ORAL | 1 refills | Status: AC

## 2020-12-03 MED ORDER — CETIRIZINE HCL 10 MG PO TABS: 10.0000 mg | ORAL_TABLET | Freq: Every day | ORAL | 1 refills | Status: DC

## 2020-12-03 MED ORDER — PREDNISONE 10 MG PO TABS: ORAL_TABLET | ORAL | 0 refills | Status: DC

## 2020-12-03 NOTE — ED Triage Notes (Signed)
Patient c/o hives that started on Monday.  Patient states that she ate shrimp on Monday.  Patient states that she has been taking Benadryl

## 2020-12-03 NOTE — ED Provider Notes (Signed)
[facility]    CSN: [id number] Arrival date & time: 12/03/20  0954      History   Chief Complaint Chief Complaint  Patient presents with  . Urticaria   HPI  50 year old female presents with the above complaint.  Patient has a history of urticaria that is chronic.  She has previously been seen by allergy.  She has been doing well until recently.  She states that she developed hives again on Monday.  She states that she has recently eaten shrimp.  This may be an inciting factor.  She has taken Atarax without resolution.  She states that this normally resolves her urticaria.  She states that she has had some sore throat as well.  No shortness of breath.  No difficulty with the airway.  No other associated symptoms.  No other complaints.  Past Medical History:  Diagnosis Date  . ADHD, adult residual type   . Allergic rhinitis    post gastric bypass  . Allergy   . Anxiety   . Arthropathy    weight bearing (pre gastric bypass)  . Chronic urticaria   . Diarrhea, functional   . DM (diabetes mellitus) ([place])    pre gastric bypass, pt states she has not been treated for it in years, her doctor doesn't discuss it with her anymore  . GERD (gastroesophageal reflux disease)    post gastric bypass  . Heart murmur    had it in the past  . History of chicken pox   . HTN (hypertension)    Pre gastric bypass  . HTN (hypertension)    post gastric bypass  . IDA (iron deficiency anemia)    post gastric bypass (iron infusion by hematology-doesn't build stores -Dr. Humphrey Rolls)  . Morbid obesity ([place])    Pre gastric bypass  . 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 Oligomenorrhea    on Implanon (pre gastric bypass)  . PONV (postoperative nausea and vomiting)    after 1st surgery  . Postpartum depression    post gastric bypass  . Pre-eclampsia    post gastric bypass  . Restless legs     Patient Active Problem List   Diagnosis Date Noted  . Immunization deficiency 06/08/2020   . Medical certificate issuance 06/08/2020  . Vaginal discharge 06/08/2020  . Epidermal cyst 04/26/2020  . Leg swelling 11/03/2019  . Dysuria 10/22/2019  . Thiamine deficiency 10/22/2019  . Left arm weakness 12/22/2018  . Severe obesity (BMI 35.0-39.9) with comorbidity ([place]) 06/03/2018  . History of open reduction and internal fixation (ORIF) procedure 02/21/2017  . Displaced fracture of lateral malleolus of left fibula, initial encounter for closed fracture 02/19/2017  . Vitamin B12 deficiency 12/21/2016  . Attention deficit hyperactivity disorder (ADHD), 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  . Chronic urticaria 03/05/2016  . PTSD (post-traumatic stress disorder) 02/28/2016  . Anxiety 06/29/2015  . 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 Work-related stress 04/28/2014  . Menorrhagia 07/04/2012  . 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 Encounter for general adult medical examination with abnormal findings 06/17/2012  . BACK PAIN, LUMBAR 07/14/2010  . Vitamin D deficiency 07/03/2010  . ALLERGIC RHINITIS 07/03/2010  . GERD 07/03/2010  . Bariatric surgery status 07/03/2010  . Essential hypertension 06/22/2010    Past Surgical History:  Procedure Laterality Date  . CHOLECYSTECTOMY  08/2010  . 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 ORIF ANKLE FRACTURE Left 02/2017   L lat malleolar fracture Erlinda Hong)  . ORIF ANKLE FRACTURE Left 02/21/2017  Procedure: OPEN REDUCTION INTERNAL FIXATION (ORIF) LEFT LATERAL MALLEOLUS ANKLE FRACTURE;  Surgeon: Leandrew Koyanagi, MD;  Location: [place];  Service: Orthopedics;  Laterality: Left;  . rectal condyloma removal  2006    OB History    Gravida  2   Para  2   Term  2   Preterm      AB      Living  2     SAB      IAB      Ectopic      Multiple      Live Births               Home Medications    Prior to Admission medications   Medication Sig Start Date End Date Taking? Authorizing Provider  ascorbic acid (VITAMIN C) 500 MG tablet Take 1  tablet (500 mg total) by mouth daily. 06/08/20  Yes [name], MD  Calcium Magnesium Zinc 333-133-5 MG TABS Take 1 tablet by mouth daily. 06/08/20  Yes [name], MD  cetirizine (ZYRTEC ALLERGY) 10 MG tablet Take 1 tablet (10 mg total) by mouth daily. 12/03/20  Yes Tondra Reierson G, DO  Cholecalciferol (VITAMIN D3) 1.25 MG (50000 UT) CAPS TAKE 1 CAPSULE BY MOUTH ONE TIME PER WEEK 08/26/19  Yes [name], MD  famotidine (PEPCID) 20 MG tablet Take 1 tablet (20 mg total) by mouth 2 (two) times daily. 12/03/20  Yes Meridian Scherger G, DO  hydrochlorothiazide (HYDRODIURIL) 12.5 MG tablet Take 1 tablet (12.5 mg total) by mouth daily. 11/03/19  Yes [name], MD  hydrOXYzine (ATARAX/VISTARIL) 10 MG tablet TAKE 1 TABLET (10 MG TOTAL) BY MOUTH DAILY AS NEEDED FOR ITCHING (HIVES). 08/12/20  Yes [name], MD  Iron-Vitamins (GERITOL PO) Take 1 tablet by mouth daily.   Yes [provider]  LORazepam (ATIVAN) 0.5 MG tablet Take 1 tablet (0.5 mg total) by mouth 2 (two) times daily as needed for anxiety. 11/15/20  Yes [name], MD  methylphenidate (CONCERTA) 18 MG PO CR tablet Take 1 tablet (18 mg total) by mouth daily. 11/15/20  Yes [name], MD  metoprolol succinate (TOPROL-XL) 50 MG 24 hr tablet Take 1 tablet (50 mg total) by mouth daily. Take with or immediately following a meal. 11/03/19  Yes [name], MD  Multiple Vitamin (MULTIVITAMIN) tablet Take 1 tablet by mouth daily.   Yes [provider]  predniSONE (DELTASONE) 10 MG tablet 50 mg daily x 2 days, then 40 mg daily x 2 days, then 30 mg daily x 2 days, then 20 mg daily x 2 days, then 10 mg daily x 2 days. 12/03/20  Yes Khamia Stambaugh G, DO  thiamine 100 MG tablet Take 1 tablet (100 mg total) by mouth daily. 11/03/19  Yes [name], MD  Turmeric 500 MG CAPS Take 1 capsule by mouth daily. 06/08/20  Yes [name], MD    Family History Family History  Problem Relation Age of Onset   . Coronary artery disease Father        2 stents; + smoker  . Hypertension Father   . Thyroid disease Brother   . Diabetes Maternal Grandmother   . Cirrhosis Paternal Grandmother   . Alcohol abuse Paternal Grandmother   . Breast cancer Other        Paternal great aunt(obesity, smoker, EtOHic)  . Breast cancer Other        Maternal great aunt (deceased in 74's from same)  . Thyroid disease Other  cousins and grandmother  . Cancer Other        great aunt - lung  . Liver disease Paternal Aunt   . Cancer Cousin        liver, bile duct  . Cancer Mother        uterine  . Uterine cancer Mother   . Colon polyps Neg Hx   . Colon cancer Neg Hx   . Esophageal cancer Neg Hx   . Rectal cancer Neg Hx   . Stomach cancer Neg Hx     Social History Social History   Tobacco Use  . Smoking status: Never Smoker  . Smokeless tobacco: Never Used  Vaping Use  . Vaping Use: Never used  Substance Use Topics  . Alcohol use: Yes    Comment: occasional  . Drug use: No     Allergies   Sertraline and Sulfa antibiotics   Review of Systems Review of Systems Per HPI  Physical Exam Triage Vital Signs ED Triage Vitals  Enc Vitals Group     BP 12/03/20 1012 (!) 148/106     Pulse Rate 12/03/20 1012 93     Resp 12/03/20 1012 14     Temp 12/03/20 1012 98 F (36.7 C)     Temp Source 12/03/20 1012 Oral     SpO2 12/03/20 1012 100 %     Weight 12/03/20 1007 230 lb (104.3 kg)     Height 12/03/20 1007 5\' 5"  (1.651 m)     Head Circumference --      Peak Flow --      Pain Score 12/03/20 1007 0     Pain Loc --      Pain Edu? --      Excl. in [place]? --    Updated Vital Signs BP (!) 148/106 (BP Location: Right Arm)   Pulse 93   Temp 98 F (36.7 C) (Oral)   Resp 14   Ht 5\' 5"  (1.651 m)   Wt 104.3 kg   SpO2 100%   BMI 38.27 kg/m   Visual Acuity Right Eye Distance:   Left Eye Distance:   Bilateral Distance:    Right Eye Near:   Left Eye Near:    Bilateral Near:     Physical  Exam Vitals and nursing note reviewed.  Constitutional:      General: She is not in acute distress.    Appearance: Normal appearance. She is not ill-appearing.  HENT:     Head: Normocephalic and atraumatic.  Eyes:     General:        Right eye: No discharge.        Left eye: No discharge.     Conjunctiva/sclera: Conjunctivae normal.  Pulmonary:     Effort: Pulmonary effort is normal. No respiratory distress.  Skin:    Comments: Raised, erythematous rash noted on the lower extremities.  Neurological:     Mental Status: She is alert.  Psychiatric:        Mood and Affect: Mood normal.        Behavior: Behavior normal.    UC Treatments / Results  Labs (all labs ordered are listed, but only abnormal results are displayed) Labs Reviewed - No data to display  EKG   Radiology No results found.  Procedures Procedures (including critical care time)  Medications Ordered in UC Medications - No data to display  Initial Impression / Assessment and Plan / UC Course  I have reviewed the triage vital  signs and the nursing notes.  Pertinent labs & imaging results that were available during my care of the patient were reviewed by me and considered in my medical decision making (see chart for details).    50 year old female presents with urticaria.  This is an acute exacerbation of a chronic problem.  Placing on Zyrtec, Pepcid, prednisone.  Atarax as needed.  Advised to avoid shrimp and other potentially inciting factors.  Final Clinical Impressions(s) / UC Diagnoses   Final diagnoses:  Allergic urticaria     Discharge Instructions     Medication as prescribed.  Avoid the shrimp.  Take care  Dr. Lacinda Axon    ED Prescriptions    Medication Sig Dispense Auth. Provider   cetirizine (ZYRTEC ALLERGY) 10 MG tablet Take 1 tablet (10 mg total) by mouth daily. 90 tablet Aprel Egelhoff G, DO   famotidine (PEPCID) 20 MG tablet Take 1 tablet (20 mg total) by mouth 2 (two) times daily.  180 tablet Jewels Langone G, DO   predniSONE (DELTASONE) 10 MG tablet 50 mg daily x 2 days, then 40 mg daily x 2 days, then 30 mg daily x 2 days, then 20 mg daily x 2 days, then 10 mg daily x 2 days. 30 tablet Coral Spikes, DO     PDMP not reviewed this encounter.   Coral Spikes, [place] 12/03/20 1404

## 2020-12-03 NOTE — Discharge Instructions (Signed)
Medication as prescribed.  Avoid the shrimp.  Take care  Dr. Lacinda Axon

## 2020-12-19 ENCOUNTER — Other Ambulatory Visit: Payer: Self-pay | Admitting: Family Medicine

## 2020-12-19 NOTE — Telephone Encounter (Signed)
Pt came in office stating she need med refill on Lorazepam and methylphenidate

## 2020-12-20 NOTE — Telephone Encounter (Signed)
Name of Medication: Lorazepam, Concerta Name of Pharmacy: [facility] Last Fill or Written Date and Quantity: 11/15/20      Lorazepam- #27      Concerta- #51 Last Office Visit and Type: 06/08/20, CPE Next Office Visit and Type: 07/07/21, CPE Last Controlled Substance Agreement Date: 10/30/17 Last UDS: 10/30/17

## 2020-12-21 NOTE — Telephone Encounter (Signed)
Mrs. Theresa Pratt called in and stated that is out of the medication.

## 2020-12-22 MED ORDER — LORAZEPAM 0.5 MG PO TABS: 0.5000 mg | ORAL_TABLET | Freq: Two times a day (BID) | ORAL | 0 refills | Status: DC | PRN

## 2020-12-22 MED ORDER — METHYLPHENIDATE HCL ER (OSM) 18 MG PO TBCR
18.0000 mg | EXTENDED_RELEASE_TABLET | Freq: Every day | ORAL | 0 refills | Status: DC
Start: 2020-12-22 — End: 2021-02-03

## 2020-12-22 NOTE — Telephone Encounter (Signed)
Plz call pt - I've sent in.  I didn't receive yesterday's phone note that she was out.

## 2020-12-22 NOTE — Telephone Encounter (Signed)
Left message on vm per dpr notifying pt refills were sent in.

## 2020-12-22 NOTE — Telephone Encounter (Signed)
This is her 4th day out of the medication and she is having issues

## 2021-01-12 ENCOUNTER — Other Ambulatory Visit: Payer: Self-pay | Admitting: Family Medicine

## 2021-01-12 DIAGNOSIS — I10 Essential (primary) hypertension: Secondary | ICD-10-CM

## 2021-01-12 NOTE — Telephone Encounter (Signed)
Patient states she is almost out and she is about to leave for vacation.    LAST APPOINTMENT DATE: 01/12/2021   NEXT APPOINTMENT DATE:@12 /16/2022  MEDICATION: Lorazepam  PHARMACY: [facility]  Let patient know to contact pharmacy at the end of the day to make sure medication is ready.  Please notify patient to allow 48-72 hours to process  Encourage patient to contact the pharmacy for refills or they can request refills through [place]:   LAST REFILL:  QTY:  REFILL DATE:    OTHER COMMENTS:    Okay for refill?  Please advise

## 2021-01-13 MED ORDER — LORAZEPAM 0.5 MG PO TABS: 0.5000 mg | ORAL_TABLET | Freq: Two times a day (BID) | ORAL | 0 refills | Status: DC | PRN

## 2021-01-13 NOTE — Addendum Note (Signed)
Addended by: Brenton Grills on: 08/23/32 91:79 PM   Modules accepted: Orders

## 2021-01-13 NOTE — Telephone Encounter (Signed)
ERx 

## 2021-01-13 NOTE — Telephone Encounter (Signed)
Name of Medication: Lorazepam Name of Pharmacy: [facility] Last Fill or Written Date and Quantity: 12/22/20, #30 Last Office Visit and Type: 06/08/20, CPE Next Office Visit and Type: 07/07/21, CPE Last Controlled Substance Agreement Date: 10/30/17 Last UDS: 10/30/17

## 2021-01-25 ENCOUNTER — Ambulatory Visit: Payer: BC Managed Care – PPO | Admitting: Internal Medicine

## 2021-01-25 ENCOUNTER — Telehealth: Payer: Self-pay

## 2021-01-25 ENCOUNTER — Other Ambulatory Visit: Payer: Self-pay

## 2021-01-25 ENCOUNTER — Encounter: Payer: Self-pay | Admitting: Internal Medicine

## 2021-01-25 DIAGNOSIS — E538 Deficiency of other specified B group vitamins: Secondary | ICD-10-CM

## 2021-01-25 DIAGNOSIS — L508 Other urticaria: Secondary | ICD-10-CM

## 2021-01-25 MED ORDER — CYANOCOBALAMIN 1000 MCG/ML IJ SOLN
1000.0000 ug | Freq: Once | INTRAMUSCULAR | Status: AC
  Administered 2021-01-25: 1000 ug via INTRAMUSCULAR

## 2021-01-25 MED ORDER — PREDNISONE 20 MG PO TABS: 40.0000 mg | ORAL_TABLET | Freq: Every day | ORAL | 1 refills | Status: DC

## 2021-01-25 NOTE — Assessment & Plan Note (Signed)
Needs injection today

## 2021-01-25 NOTE — Telephone Encounter (Signed)
Please see note below access nurse note;pt has appt with Dr Silvio Pate today at 11:15.

## 2021-01-25 NOTE — Telephone Encounter (Signed)
[facility] - Client TELEPHONE ADVICE RECORD Access[facility]urse Patient Name: Theresa Pratt ON Gender: Female DOB: 05/11/1971 Age: 50 Y 12 M 11 D Return Phone Number: [phone] (Primary) Address: City/ State/ Zip: [place] [address] Client [facility] - Client Client Site [facility] - Day Physician [name] - MD Contact Type Call Who Is Calling Patient / Member / Family / Caregiver Call Type Triage / Clinical Caller Name Eunie Relationship To Patient Other Return Phone Number [phone] (Primary) Chief Complaint Hives Reason for Call Symptomatic / Request for [place] is with the office and has patient who needs to be triaged. The patient has hives and rash all over her body. Translation No Nurse Assessment Nurse: Alba Destine, RN, Melony Overly Date/Time (Eastern Time): 01/25/2021 9:42:02 AM Confirm and document reason for call. If symptomatic, describe symptoms. ---Caller is with the office and has patient who needs to be triaged. The patient has hives and rash all over her body. no vomiting no fever. has had issues with it in the past. came back. no trouble breathing swallowing or trouble swallowing. red dots and painful and itching. Does the patient have any new or worsening symptoms? ---Yes Will a triage be completed? ---Yes Related visit to physician within the last 2 weeks? ---No Does the PT have any chronic conditions? (i.e. diabetes, asthma, this includes High risk factors for pregnancy, etc.) ---No Is the patient pregnant or possibly pregnant? (Ask all females between the ages of 50-55) ---No Is this a behavioral health or substance abuse call? ---No Guidelines Guideline Title Affirmed Question Affirmed Notes Nurse Date/Time (Eastern Time) Hives [1] MODERATESEVERE hives persist (i.e., hives interfere with normal activities or  work) Alba Destine, Therapist, sports, Melony Overly 01/25/2021 9:45:18 AM PLEASE NOTE: All timestamps contained within this report are represented as [place] Standard Time. CONFIDENTIALTY NOTICE: This fax transmission is intended only for the addressee. It contains information that is legally privileged, confidential or otherwise protected from use or disclosure. If you are not the intended recipient, you are strictly prohibited from reviewing, disclosing, copying using or disseminating any of this information or taking any action in reliance on or regarding this information. If you have received this fax in error, please notify us immediately by telephone so that we can arrange for its return to [place]. Phone: [phone], Toll-Free: [phone], Fax: [phone] Page: 2 of 2 Call Id: [id number] Guidelines Guideline Title Affirmed Question Affirmed Notes Nurse Date/Time Eilene Ghazi Time) AND [2] taking antihistamine (e.g., Benadryl, Claritin) > 24 hours Disp. Time Eilene Ghazi Time) Disposition Final User 01/25/2021 9:52:21 AM See PCP within 24 Hours Yes Alba Destine, RN, Melony Overly Caller Disagree/Comply Comply Caller Understands Yes PreDisposition Did not know what to do Care Advice Given Per Guideline SEE PCP WITHIN 24 HOURS: * IF OFFICE WILL BE OPEN: You need to be examined within the next 24 hours. Call your doctor (or NP/PA) when the office opens and make an appointment. ANTIHISTAMINE MEDICINES FOR WIDESPREAD HIVES: * For widespread hives, take either cetirizine or loratadine. They can help reduce the rash, itching, and other allergic symptoms. * They are over-the-counter (OTC) antihistamine medicines. You can buy them at the drugstore. * CETIRIZINE (REACTINE, ZYRTEC): The adult dose is 10 mg and you take it once a day. Cetirizine is available in the [place] as Zyrtec and in [place] as Reactine. * LORATADINE (ALAVERT, CLARITIN): The adult dose is 10 mg and you take it once a day. Loratadine is available  in the [place] as Engineer, maintenance (IT); it is available in [place] as Claritin. * Before taking any medicine, read all the instructions on the package. * Take a cool bath for 10 minutes to relieve itching. (Caution: avoid any chill). COOL BATH FOR ITCHING: * Rub very itchy areas with an ice cube for 10 minutes. PREVENTION - REMOVE ALLERGENS: * Take a bath or shower if triggered by pollens or animal contact. * Change clothes. HYDROCORTISONE CREAM FOR VERY ITCHY SPOTS: * You can use hydrocortisone for very itchy spots. * This is an over-the-counter (OTC) drug. You can buy it at the drugstore. * Put 1% hydrocortisone cream on the itchy area(s) 3 times a day. Use it for a couple days, until it feels better. This will help decrease the itching. * Some people like to keep the cream in the refrigerator. It feels even better if the cream is used when it is cold. * CAUTION: Do not use hydrocortisone cream for more than 1 week without talking to your doctor. * Read the instructions and warnings on the package insert for all medicines you take. CALL BACK IF: * You become worse CARE ADVICE given per Hives (Adult) guideline. Comments User: Baxter Flattery, RN Date/Time Eilene Ghazi Time): 01/25/2021 9:53:51 AM caller refusing UC. office is booked . office will reach out to caller back. Referrals REFERRED TO PCP OFFICE

## 2021-01-25 NOTE — Telephone Encounter (Signed)
Theresa Pratt front office mgr spoke with access nurse; pt has hives all over body and was advised to go to UC which pt refused.  I spoke with pt; pt said seen UC 12/03/20 with similar issue and was given prednisone and symptoms cleared. Starting on 01/24/21 pt developed hives on arms and waistline down legs. No new soaps, detergent, clothes furniture, make up or anything new. Pt does not have difficulty breathing or swelling in mouth, lips, tongue or throat. Pt scheduled in office appt with Dr Silvio Pate today at 11:15. No covid symptoms or known exposure. UC & ED precautions given and pt voiced understanding.Sending note to Dr Silvio Pate and DR G as PCP.

## 2021-01-25 NOTE — Telephone Encounter (Signed)
See OV note.  

## 2021-01-25 NOTE — Telephone Encounter (Signed)
Thanks to Dr Silvio Pate for seeing pt.

## 2021-01-25 NOTE — Assessment & Plan Note (Signed)
Has been quiet but now more stress Continue cetirizine, famotidine and hydroxyzine Will give 3 days of prednisone

## 2021-01-25 NOTE — Telephone Encounter (Signed)
Pt was seen by Dr Silvio Pate this morning and asked to get a B12 injection. We did give it to her. She has not had one in a few months. She was asking if se could get it from the pharmacy and give it to herself monthly. She has not been taking oral as her CPE stated.
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
## 2021-01-25 NOTE — Addendum Note (Signed)
Addended by: Pilar Grammes on: 01/25/2021 12:47 PM   Modules accepted: Orders

## 2021-01-26 MED ORDER — CYANOCOBALAMIN 1000 MCG/ML IJ SOLN: 1000.0000 ug | INTRAMUSCULAR | 6 refills | Status: DC

## 2021-01-26 NOTE — Telephone Encounter (Signed)
Ok to do but would want her to do b12 injection teaching with nurse visit.  I've sent B12 to pharmacy. Please send syringe/needle supplies to pharmacy.

## 2021-01-27 MED ORDER — "SYRINGE 25G X 1"" 3 ML MISC": 6 refills | Status: AC

## 2021-01-27 NOTE — Addendum Note (Signed)
Addended by: Tammi Sou on: 01/27/2021 10:21 AM   Modules accepted: Orders

## 2021-01-27 NOTE — Telephone Encounter (Signed)
Syringe/needle sent to pharmacy, will route to nurse to f/u with pt regarding injection teaching

## 2021-01-30 NOTE — Telephone Encounter (Signed)
Pt has been scheduled for injection teaching on 7/25. Pt will bring syringes with her so she can be educated on how to use the ones dispensed at the pharmacy.

## 2021-01-30 NOTE — Telephone Encounter (Signed)
LVM, please route msg to this nurse to schedule apt.

## 2021-02-03 ENCOUNTER — Other Ambulatory Visit: Payer: Self-pay | Admitting: Family Medicine

## 2021-02-03 NOTE — Telephone Encounter (Signed)
  LAST APPOINTMENT DATE:  Please schedule appointment if longer than 1 year  NEXT APPOINTMENT DATE:'@7'$ /25/2022  MEDICATION: LORazepam (ATIVAN) 0.5 MG tablet methylphenidate (CONCERTA) 18 MG PO CR tablet    Is the patient out of medication? yes  [facility] [place]   Let patient know to contact pharmacy at the end of the day to make sure medication is ready.  Please notify patient to allow 48-72 hours to process  Encourage patient to contact the pharmacy for refills or they can request refills through [place]:   LAST REFILL:  QTY:  REFILL DATE:    OTHER COMMENTS:    Okay for refill?  Please advise

## 2021-02-03 NOTE — Telephone Encounter (Signed)
Name of Medication: Lorazepam, Concerta Name of Pharmacy: [facility] Last Fill or Written Date and Quantity:       Lorazepam- [id number], [id number]      Concerta- [id number], [id number] Last Office Visit and Type: 06/08/20, CPE Next Office Visit and Type: 07/07/21, CPE Last Controlled Substance Agreement Date: 10/30/17 Last UDS: 10/30/17

## 2021-02-03 NOTE — Addendum Note (Signed)
Addended by: Brenton Grills on: [id number] PM   Modules accepted: Orders

## 2021-02-06 ENCOUNTER — Telehealth: Payer: Self-pay

## 2021-02-06 ENCOUNTER — Other Ambulatory Visit: Payer: Self-pay

## 2021-02-06 ENCOUNTER — Ambulatory Visit (INDEPENDENT_AMBULATORY_CARE_PROVIDER_SITE_OTHER): Payer: BC Managed Care – PPO

## 2021-02-06 DIAGNOSIS — E538 Deficiency of other specified B group vitamins: Secondary | ICD-10-CM

## 2021-02-06 MED ORDER — PREDNISONE 20 MG PO TABS: ORAL_TABLET | ORAL | 0 refills | Status: DC

## 2021-02-06 NOTE — Telephone Encounter (Signed)
Pt in office today for B12 injection education and reported she has recurring hives which she was seen in the office by Dr. Silvio Pate on 7/13 for hives. He prescribed prednisone but she currently only has 1/2 of a '20mg'$  tablet left. She is requesting a refill until she can make an apt with her allergist. Pt had evident hives on both thighs today in the office. Advised a msg would be sent to her PCP and this office would f/u. Advised if anything worsened to contact office. Pt verbalized understanding and was appreciative.

## 2021-02-06 NOTE — Telephone Encounter (Signed)
How did she take current prednisone course?  Prednisone '20mg'$  taper sent to pharmacy.  Please have her schedule allergist f/u. Don't recommend more prednisone after this course until seen by allergist .

## 2021-02-06 NOTE — Progress Notes (Signed)
Pt in today for education on IM injections of B12 at home. Pt did not pick up syringes and needles at pharmacy before coming to apt. Educated pt on drawing medication and correct IM injection technique and locations. Allowed pt to draw up sterile water and practice injection without needle. Answered all pt questions. Advised if any new questions to contact office. Pt verbalized understanding.

## 2021-02-07 MED ORDER — LORAZEPAM 0.5 MG PO TABS: 0.5000 mg | ORAL_TABLET | Freq: Two times a day (BID) | ORAL | 0 refills | Status: DC | PRN

## 2021-02-07 MED ORDER — METHYLPHENIDATE HCL ER (OSM) 18 MG PO TBCR: 18.0000 mg | EXTENDED_RELEASE_TABLET | Freq: Every day | ORAL | 0 refills | Status: DC

## 2021-02-07 NOTE — Telephone Encounter (Signed)
Patient advised and verbalized understanding. Patient states she picked up Prednisone RX already. Patient took Prednisone as prescribed when Dr Silvio Pate prescribed this medication on 01/25/21 and she took it 2 tablets once daily for 3 days. But then her hives re appeared so she refilled it and took the first day 2 tablets the next day just 1 tablet and the following day because her symptoms were not so severe she only took 1/2 tablet, she made that decision on her own.  Patient advised to take this course as directed on the RX.  Patient states she is trying to figure out what is causing this break out to re occur, trying to cut out foods, seasoning, etc. Per patient seems like everything she eats is giving her reaction. She is going to follow with allergist.

## 2021-02-07 NOTE — Telephone Encounter (Signed)
Theresa Pratt called in and stated that he filled the predisone but not the Concerta and the Lorazepam. And she really needs it she cant focus and shes been without the medication for 5 days

## 2021-02-07 NOTE — Telephone Encounter (Signed)
ERx 

## 2021-02-09 ENCOUNTER — Telehealth: Payer: Self-pay | Admitting: *Deleted

## 2021-02-09 NOTE — Telephone Encounter (Signed)
Placed note in Dr. Synthia Innocent box.

## 2021-02-09 NOTE — Telephone Encounter (Signed)
Pt called the scheduler line wanting to get a message to PCP and Lattie Haw. Pt is taking online classes and missed some work due to her having an allergic reaction recently. She said she needs a letter from PCP so she can make up her online work she missed. She said she wanted to explain exactly what she needed from PCP in the letter so she said she is going to drop off a letter herself explaining everything that the letter needs to say today so Lattie Haw can review and give it PCP once he returns. FYI pt PCP and Lattie Haw

## 2021-02-13 NOTE — Telephone Encounter (Signed)
Spoke with pt notifying her the letter is ready to pick up.  Also, there is a $10.00 fee to have letter completed.  Pt verbalizes understanding and expresses her thanks.  [Placed letter at front office- yellow folders.  Also, made copy to scan with pt's request and 1 copy for billing.]

## 2021-02-13 NOTE — Telephone Encounter (Signed)
Letter written and in Lisa's box.  

## 2021-02-14 ENCOUNTER — Ambulatory Visit (INDEPENDENT_AMBULATORY_CARE_PROVIDER_SITE_OTHER): Payer: BC Managed Care – PPO | Admitting: Obstetrics & Gynecology

## 2021-02-14 ENCOUNTER — Other Ambulatory Visit: Payer: Self-pay

## 2021-02-14 VITALS — BP 144/103 | HR 108 | Ht 65.0 in | Wt 230.2 lb

## 2021-02-14 DIAGNOSIS — N946 Dysmenorrhea, unspecified: Secondary | ICD-10-CM

## 2021-02-14 DIAGNOSIS — E559 Vitamin D deficiency, unspecified: Secondary | ICD-10-CM

## 2021-02-14 DIAGNOSIS — K909 Intestinal malabsorption, unspecified: Secondary | ICD-10-CM

## 2021-02-14 DIAGNOSIS — A630 Anogenital (venereal) warts: Secondary | ICD-10-CM | POA: Diagnosis not present

## 2021-02-14 DIAGNOSIS — E538 Deficiency of other specified B group vitamins: Secondary | ICD-10-CM

## 2021-02-14 DIAGNOSIS — Z01419 Encounter for gynecological examination (general) (routine) without abnormal findings: Secondary | ICD-10-CM | POA: Diagnosis not present

## 2021-02-14 MED ORDER — IMIQUIMOD 3.75 % EX CREA: TOPICAL_CREAM | CUTANEOUS | 0 refills | Status: DC

## 2021-02-14 NOTE — Progress Notes (Signed)
Patient ID: Theresa Pratt, female   DOB: 11/01/1970, 50 y.o.   MRN: [id number]  Chief Complaint  Patient presents with   Gynecologic Exam    HPI Theresa Pratt is a 50 y.o. female.  [id number] No LMP recorded. (Menstrual status: Perimenopausal). Menses have always been irregular and she has cramps for 3 out of 5 days with some heavier flow. Due is up to date with pap test and will need a screening mammogram. HPI  Past Medical History:  Diagnosis Date   ADHD, adult residual type    Allergic rhinitis    post gastric bypass   Allergy    Anxiety    Arthropathy    weight bearing (pre gastric bypass)   Chronic urticaria    Diarrhea, functional    DM (diabetes mellitus) ([place])    pre gastric bypass, pt states she has not been treated for it in years, her doctor doesn't discuss it with her anymore   GERD (gastroesophageal reflux disease)    post gastric bypass   Heart murmur    had it in the past   History of chicken pox    HTN (hypertension)    Pre gastric bypass   HTN (hypertension)    post gastric bypass   IDA (iron deficiency anemia)    post gastric bypass (iron infusion by hematology-doesn't build stores -Dr. 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[facility])   ORIF ANKLE FRACTURE Left 02/2017   L lat malleolar fracture ([facility])   ORIF ANKLE FRACTURE Left 02/21/2017   Procedure: OPEN REDUCTION INTERNAL FIXATION (ORIF) LEFT LATERAL MALLEOLUS ANKLE FRACTURE;  Surgeon: Leandrew Koyanagi, MD;  Location: [place];  Service: Orthopedics;  Laterality: Left;   rectal condyloma removal  2006     Family History  Problem Relation Age of Onset   Coronary artery disease Father        2 stents; + smoker   Hypertension Father    Thyroid disease Brother    Diabetes Maternal Grandmother    Cirrhosis Paternal Grandmother    Alcohol abuse Paternal Grandmother    Breast cancer Other        Paternal great aunt(obesity, smoker, EtOHic)   Breast cancer Other        Maternal great aunt (deceased in 68's from same)   Thyroid disease Other        cousins and grandmother   Cancer Other        great aunt - lung   Liver disease Paternal Aunt    Cancer Cousin        liver, bile duct   Cancer Mother        uterine   Uterine cancer Mother    Colon polyps Neg Hx    Colon cancer Neg Hx    Esophageal cancer Neg Hx    Rectal cancer Neg Hx    Stomach cancer Neg Hx     Social History Social History   Tobacco Use   Smoking status: Never  Smokeless tobacco: Never  Vaping Use   Vaping Use: Never used  Substance Use Topics   Alcohol use: Yes    Comment: occasional   Drug use: No    Allergies  Allergen Reactions   Sertraline Hives   Sulfa Antibiotics     Rash on occasion    Current Outpatient Medications  Medication Sig Dispense Refill   Imiquimod 3.75 % CREA Apply to affected area 2-3 times weekly for 6 weeks 7.5 g 0   ascorbic acid (VITAMIN C) 500 MG tablet Take 1 tablet (500 mg total) by mouth daily.     Calcium Magnesium Zinc 333-133-5 MG TABS Take 1 tablet by mouth daily.     cetirizine (ZYRTEC ALLERGY) 10 MG tablet Take 1 tablet (10 mg total) by mouth daily. 90 tablet 1   Cholecalciferol (VITAMIN D3) 1.25 MG (50000 UT) CAPS TAKE 1 CAPSULE BY MOUTH ONE TIME PER WEEK 12 capsule 1   cyanocobalamin (,VITAMIN B-12,) 1000 MCG/ML injection Inject 1 mL (1,000 mcg total) into the muscle every 30 (thirty) days. 1 mL 6   famotidine (PEPCID) 20 MG tablet Take 1 tablet (20 mg total) by mouth 2 (two) times daily. 180 tablet 1   hydrochlorothiazide (HYDRODIURIL) 12.5 MG tablet TAKE 1  TABLET BY MOUTH EVERY DAY 90 tablet 1   hydrOXYzine (ATARAX/VISTARIL) 10 MG tablet TAKE 1 TABLET (10 MG TOTAL) BY MOUTH DAILY AS NEEDED FOR ITCHING (HIVES). 90 tablet 2   Iron-Vitamins (GERITOL PO) Take 1 tablet by mouth daily.     LORazepam (ATIVAN) 0.5 MG tablet Take 1 tablet (0.5 mg total) by mouth 2 (two) times daily as needed for anxiety. 30 tablet 0   methylphenidate (CONCERTA) 18 MG PO CR tablet Take 1 tablet (18 mg total) by mouth daily. 30 tablet 0   metoprolol succinate (TOPROL-XL) 50 MG 24 hr tablet TAKE 1 TABLET (50 MG TOTAL) BY MOUTH DAILY. TAKE WITH OR IMMEDIATELY FOLLOWING A MEAL. 90 tablet 1   Multiple Vitamin (MULTIVITAMIN) tablet Take 1 tablet by mouth daily.     predniSONE (DELTASONE) 20 MG tablet Take two tablets daily for 3 days followed by one tablet daily for 4 days 10 tablet 0   Syringe/Needle, Disp, (SYRINGE 3CC/25GX1") 25G X 1" 3 ML MISC Use with B12 injection once monthly 1 each 6   thiamine 100 MG tablet Take 1 tablet (100 mg total) by mouth daily.     Turmeric 500 MG CAPS Take 1 capsule by mouth daily.     No current facility-administered medications for this visit.    Review of Systems Review of Systems  Constitutional: Negative.   Respiratory: Negative.    Gastrointestinal: Negative.   Genitourinary:  Positive for menstrual problem. Negative for dyspareunia, dysuria, vaginal bleeding and vaginal discharge. Pelvic pain: menstrual cramps.  Blood pressure (!) 144/103, pulse (!) 108, height '5\' 5"'$  (1.651 m), weight 230 lb 3.2 oz (104.4 kg).  Physical Exam Physical Exam Vitals and nursing note reviewed. Exam conducted with a chaperone present.  Constitutional:      Appearance: Normal appearance.  HENT:     Head: Normocephalic and atraumatic.  Cardiovascular:     Rate and Rhythm: Normal rate.  Pulmonary:     Effort: Pulmonary effort is normal.     Breath sounds: Normal breath sounds.  Abdominal:     Palpations: Abdomen is soft. There is no mass.      Tenderness: There is no abdominal tenderness.  Genitourinary:    General: Normal vulva.  Exam position: Lithotomy position.     Vagina: Normal.     Cervix: Normal.     Uterus: Normal.      Adnexa: Right adnexa normal and left adnexa normal.  Skin:    General: Skin is warm and dry.  Neurological:     General: No focal deficit present.  Psychiatric:        Mood and Affect: Mood normal.        Behavior: Behavior normal.   Perianal wart Data Reviewed Pap 2020 normal  Assessment Well woman exam with routine gynecological exam - Plan: MM 3D SCREEN BREAST BILATERAL, CBC, FSH, Iron, TIBC and Ferritin Panel, B12, Vitamin B1, Vitamin D 1,25 dihydroxy  Dysmenorrhea - Plan: US PELVIC COMPLETE WITH TRANSVAGINAL  Perianal wart - Plan: Imiquimod 3.75 % CREA  Iron malabsorption  Vitamin D deficiency  Vitamin B12 deficiency   Plan Imiquimod for wart. Will f/u with PCP Mammogram ordered Orders Placed This Encounter  Procedures   MM 3D SCREEN BREAST BILATERAL    Ins; bcbs state Pf: 01/21/2019'@bcg'$   Annual/ no new problems/ no needs / no implants/ no reduction / mm with [facility] [phone]    Standing Status:   Future    Standing Expiration Date:   02/14/2022    Order Specific Question:   Reason for Exam (SYMPTOM  OR DIAGNOSIS REQUIRED)    Answer:   Needs routine screening    Order Specific Question:   Is the patient pregnant?    Answer:   No    Order Specific Question:   Preferred imaging location?    Answer:   [facility]   US PELVIC COMPLETE WITH TRANSVAGINAL    Standing Status:   Future    Standing Expiration Date:   04/16/2021    Order Specific Question:   Reason for Exam (SYMPTOM  OR DIAGNOSIS REQUIRED)    Answer:   dysmenorrhea    Order Specific Question:   Preferred imaging location?    Answer:   [facility]    Order Specific Question:   Release to patient    Answer:   Immediate   CBC   FSH   Iron, TIBC and Ferritin Panel   B12   Vitamin B1    Vitamin D 1,25 dihydroxy       Emeterio Reeve 02/14/2021, 11:45 AM

## 2021-02-15 ENCOUNTER — Ambulatory Visit (INDEPENDENT_AMBULATORY_CARE_PROVIDER_SITE_OTHER): Payer: BC Managed Care – PPO | Admitting: Psychology

## 2021-02-15 DIAGNOSIS — F4310 Post-traumatic stress disorder, unspecified: Secondary | ICD-10-CM

## 2021-02-17 ENCOUNTER — Other Ambulatory Visit: Payer: Self-pay

## 2021-02-17 ENCOUNTER — Ambulatory Visit (HOSPITAL_COMMUNITY)
Admission: RE | Admit: 2021-02-17 | Discharge: 2021-02-17 | Disposition: A | Discharge status: Home / Self Care | Payer: BC Managed Care – PPO | Source: Ambulatory Visit | Attending: Obstetrics & Gynecology | Admitting: Obstetrics & Gynecology

## 2021-02-17 DIAGNOSIS — N946 Dysmenorrhea, unspecified: Secondary | ICD-10-CM | POA: Insufficient documentation

## 2021-02-22 LAB — CBC
Hematocrit: 45.9 % (ref 34.0–46.6)
Hemoglobin: 15 g/dL (ref 11.1–15.9)
MCH: 28.2 pg (ref 26.6–33.0)
MCHC: 32.7 g/dL (ref 31.5–35.7)
MCV: 86 fL (ref 79–97)
Platelets: 455 10*3/uL — ABNORMAL HIGH (ref 150–450)
RBC: 5.31 x10E6/uL — ABNORMAL HIGH (ref 3.77–5.28)
RDW: 12.9 % (ref 11.7–15.4)
WBC: 15.2 10*3/uL — ABNORMAL HIGH (ref 3.4–10.8)

## 2021-02-22 LAB — FOLLICLE STIMULATING HORMONE: FSH: 30.2 m[IU]/mL

## 2021-02-22 LAB — VITAMIN B12: Vitamin B-12: 1127 pg/mL (ref 232–1245)

## 2021-02-22 LAB — IRON,TIBC AND FERRITIN PANEL
Ferritin: 123 ng/mL (ref 15–150)
Iron Saturation: 29 % (ref 15–55)
Iron: 106 ug/dL (ref 27–159)
Total Iron Binding Capacity: 361 ug/dL (ref 250–450)
UIBC: 255 ug/dL (ref 131–425)

## 2021-02-22 LAB — VITAMIN D 1,25 DIHYDROXY
Vitamin D 1, 25 (OH)2 Total: 61 pg/mL
Vitamin D2 1, 25 (OH)2: <10 pg/mL
Vitamin D3 1, 25 (OH)2: 61 pg/mL

## 2021-02-22 LAB — VITAMIN B1: Thiamine: 224.1 nmol/L — ABNORMAL HIGH (ref 66.5–200.0)

## 2021-02-23 ENCOUNTER — Other Ambulatory Visit: Payer: Self-pay

## 2021-02-23 ENCOUNTER — Ambulatory Visit
Admission: RE | Admit: 2021-02-23 | Discharge: 2021-02-23 | Disposition: A | Discharge status: Home / Self Care | Payer: BC Managed Care – PPO | Source: Ambulatory Visit | Attending: Obstetrics & Gynecology | Admitting: Obstetrics & Gynecology

## 2021-02-23 DIAGNOSIS — Z01419 Encounter for gynecological examination (general) (routine) without abnormal findings: Secondary | ICD-10-CM

## 2021-02-27 ENCOUNTER — Ambulatory Visit: Payer: BC Managed Care – PPO | Admitting: Psychology

## 2021-03-16 ENCOUNTER — Other Ambulatory Visit: Payer: Self-pay | Admitting: Family Medicine

## 2021-03-16 MED ORDER — LORAZEPAM 0.5 MG PO TABS: 0.5000 mg | ORAL_TABLET | Freq: Two times a day (BID) | ORAL | 0 refills | Status: DC | PRN

## 2021-03-16 MED ORDER — METHYLPHENIDATE HCL ER (OSM) 18 MG PO TBCR: 18.0000 mg | EXTENDED_RELEASE_TABLET | Freq: Every day | ORAL | 0 refills | Status: DC

## 2021-03-16 NOTE — Telephone Encounter (Signed)
Name of Medication: Lorazepam, Concerta Name of Pharmacy: [facility] Last Fill or Written Date and Quantity: 02/07/21      Lorazepam- [id number]      Concerta- [id number] Last Office Visit and Type: 06/08/20, CPE Next Office Visit and Type: 07/07/20, CPE Last Controlled Substance Agreement Date: 10/30/17 Last UDS: 10/30/17

## 2021-03-16 NOTE — Telephone Encounter (Signed)
ERx 

## 2021-03-16 NOTE — Telephone Encounter (Signed)
  Encourage patient to contact the pharmacy for refills or they can request refills through [place]:  Please schedule appointment if longer than 1 year  NEXT APPOINTMENT DATE:  MEDICATION: methylphenidate (CONCERTA) 18 MG PO CR tablet LORazepam (ATIVAN) 0.5 MG tablet  Is the patient out of medication? yes  PHARMACY: [id number]  Let patient know to contact pharmacy at the end of the day to make sure medication is ready.  Please notify patient to allow 48-72 hours to process  CLINICAL FILLS OUT ALL BELOW:   LAST REFILL:  QTY:  REFILL DATE:    OTHER COMMENTS:    Okay for refill?  Please advise

## 2021-04-09 ENCOUNTER — Other Ambulatory Visit: Payer: Self-pay | Admitting: Family Medicine

## 2021-04-18 ENCOUNTER — Telehealth: Payer: Self-pay | Admitting: Family Medicine

## 2021-04-18 NOTE — Telephone Encounter (Signed)
Pt called requesting refill on methylphenidate... says this request has been sent 5x.Marland Kitchen asking to speak to a nurse.

## 2021-04-19 MED ORDER — METHYLPHENIDATE HCL ER (OSM) 18 MG PO TBCR: 18.0000 mg | EXTENDED_RELEASE_TABLET | Freq: Every day | ORAL | 0 refills | Status: DC

## 2021-04-19 NOTE — Telephone Encounter (Signed)
ERx. plz notify pt we didn't receive any requests until her phone call yesterday at 4:55pm.

## 2021-04-19 NOTE — Telephone Encounter (Signed)
Name of Medication: Concerta Name of [facility] Last Fill or Written Date and Quantity: 03/16/21 #30 Last Office Visit and Type: 06/08/20 Next Office Visit and Type: 07/07/21 Last Controlled Substance Agreement Date: 10/30/17 Last UDS:4/17/1  No record of other refill request being received

## 2021-04-20 NOTE — Telephone Encounter (Signed)
Left message on vm per dpr relaying Dr. G's message.  

## 2021-04-26 ENCOUNTER — Telehealth: Payer: Self-pay | Admitting: Family Medicine

## 2021-04-26 ENCOUNTER — Telehealth: Payer: Self-pay

## 2021-04-26 ENCOUNTER — Other Ambulatory Visit: Payer: Self-pay | Admitting: Family Medicine

## 2021-04-26 NOTE — Telephone Encounter (Signed)
Pt called and said that [facility] said for provider to call [phone], the PA line to expedite the process. Please advise

## 2021-04-26 NOTE — Telephone Encounter (Signed)
Error

## 2021-04-26 NOTE — Telephone Encounter (Signed)
Pt is requesting a call back as soon as possible. She said she thinks there was a miscommunication and wants to talk to the nurse. Call back [phone]

## 2021-04-26 NOTE — Telephone Encounter (Signed)
Received faxed PA request from [facility] for Methylphenidate (Concerta) 18 mg CR tab.  Submitted PA; key:  [id number], Rx #:  [id number].  Decision pending.

## 2021-04-27 NOTE — Telephone Encounter (Signed)
ERx 

## 2021-04-27 NOTE — Telephone Encounter (Signed)
Spoke with pt.  (See phn note, 04/26/21)

## 2021-04-27 NOTE — Telephone Encounter (Signed)
Received faxed PA approval, valid 04/26/2021- 04/26/2024.  Made pharmacy aware.

## 2021-05-26 ENCOUNTER — Other Ambulatory Visit: Payer: Self-pay | Admitting: Family Medicine

## 2021-05-26 ENCOUNTER — Telehealth: Payer: Self-pay | Admitting: Family Medicine

## 2021-05-26 NOTE — Telephone Encounter (Signed)
  Encourage patient to contact the pharmacy for refills or they can request refills through [place]:  Please schedule appointment if longer than 1 year  NEXT APPOINTMENT DATE: 06/30/21  MEDICATION:lorazepam  Is the patient out of medication? yes  [facility] [place]  Let patient know to contact pharmacy at the end of the day to make sure medication is ready.  Please notify patient to allow 48-72 hours to process  CLINICAL FILLS OUT ALL BELOW:   LAST REFILL:  QTY:  REFILL DATE:    OTHER COMMENTS:    Okay for refill?  Please advise

## 2021-05-26 NOTE — Telephone Encounter (Signed)
Duplicate request.  See Refill note, 05/26/21.

## 2021-05-26 NOTE — Telephone Encounter (Signed)
Refill request Lorazepam Last refill 04/27/21 #30 Last office visit 01/25/21 acute Upcoming appointment 07/07/21

## 2021-05-29 NOTE — Telephone Encounter (Signed)
ERx 

## 2021-06-25 ENCOUNTER — Other Ambulatory Visit: Payer: Self-pay | Admitting: Family Medicine

## 2021-06-25 DIAGNOSIS — Z9884 Bariatric surgery status: Secondary | ICD-10-CM

## 2021-06-25 DIAGNOSIS — Z1159 Encounter for screening for other viral diseases: Secondary | ICD-10-CM

## 2021-06-25 DIAGNOSIS — E519 Thiamine deficiency, unspecified: Secondary | ICD-10-CM

## 2021-06-25 DIAGNOSIS — E559 Vitamin D deficiency, unspecified: Secondary | ICD-10-CM

## 2021-06-25 DIAGNOSIS — K909 Intestinal malabsorption, unspecified: Secondary | ICD-10-CM

## 2021-06-25 DIAGNOSIS — I10 Essential (primary) hypertension: Secondary | ICD-10-CM

## 2021-06-25 DIAGNOSIS — E538 Deficiency of other specified B group vitamins: Secondary | ICD-10-CM

## 2021-06-28 ENCOUNTER — Other Ambulatory Visit: Payer: Self-pay | Admitting: Family Medicine

## 2021-06-28 MED ORDER — LORAZEPAM 0.5 MG PO TABS: ORAL_TABLET | ORAL | 0 refills | Status: DC

## 2021-06-28 MED ORDER — METHYLPHENIDATE HCL ER (OSM) 18 MG PO TBCR: 18.0000 mg | EXTENDED_RELEASE_TABLET | Freq: Every day | ORAL | 0 refills | Status: DC

## 2021-06-28 NOTE — Telephone Encounter (Signed)
ERx 

## 2021-06-28 NOTE — Telephone Encounter (Signed)
Name of Medication: Lorazepam, Methylphenidate Name of Pharmacy: [facility] Last Fill or Written Date and Quantity:       Lorazepam- 05/29/21, #30      Methylphenidate- 04/19/21, #30 Last Office Visit and Type: 06/08/20, CPE Next Office Visit and Type: 07/07/21, CPE Last Controlled Substance Agreement Date: 10/30/17 Last UDS: 10/30/17

## 2021-06-28 NOTE — Addendum Note (Signed)
Addended by: [name] on: 06/28/2021 06:53 PM   Modules accepted: Orders

## 2021-06-28 NOTE — Telephone Encounter (Signed)
°  Encourage patient to contact the pharmacy for refills or they can request refills through [facility]:  Please schedule appointment if longer than 1 year  NEXT APPOINTMENT DATE:  MEDICATION:methylphenidate (CONCERTA) 18 MG PO CR tablet   LORazepam (ATIVAN) 0.5 MG tablet  Is the patient out of medication?   PHARMACY:[id number] - [place], [place] - [id number]    Let patient know to contact pharmacy at the end of the day to make sure medication is ready.  Please notify patient to allow 48-72 hours to process  CLINICAL FILLS OUT ALL BELOW:   LAST REFILL:  QTY:  REFILL DATE:    OTHER COMMENTS:    Okay for refill?  Please advise

## 2021-06-30 ENCOUNTER — Other Ambulatory Visit: Payer: BC Managed Care – PPO

## 2021-07-07 ENCOUNTER — Encounter: Payer: Self-pay | Admitting: Family Medicine

## 2021-07-07 ENCOUNTER — Other Ambulatory Visit: Payer: Self-pay

## 2021-07-07 ENCOUNTER — Ambulatory Visit (INDEPENDENT_AMBULATORY_CARE_PROVIDER_SITE_OTHER): Payer: BC Managed Care – PPO | Admitting: Family Medicine

## 2021-07-07 VITALS — BP 128/86 | HR 86 | Temp 97.4°F | Ht 65.0 in | Wt 231.0 lb

## 2021-07-07 DIAGNOSIS — E519 Thiamine deficiency, unspecified: Secondary | ICD-10-CM

## 2021-07-07 DIAGNOSIS — Z9884 Bariatric surgery status: Secondary | ICD-10-CM | POA: Diagnosis not present

## 2021-07-07 DIAGNOSIS — Z1211 Encounter for screening for malignant neoplasm of colon: Secondary | ICD-10-CM

## 2021-07-07 DIAGNOSIS — Z1159 Encounter for screening for other viral diseases: Secondary | ICD-10-CM

## 2021-07-07 DIAGNOSIS — K909 Intestinal malabsorption, unspecified: Secondary | ICD-10-CM | POA: Diagnosis not present

## 2021-07-07 DIAGNOSIS — I10 Essential (primary) hypertension: Secondary | ICD-10-CM

## 2021-07-07 DIAGNOSIS — E538 Deficiency of other specified B group vitamins: Secondary | ICD-10-CM

## 2021-07-07 DIAGNOSIS — Z0000 Encounter for general adult medical examination without abnormal findings: Secondary | ICD-10-CM

## 2021-07-07 DIAGNOSIS — M545 Low back pain, unspecified: Secondary | ICD-10-CM | POA: Diagnosis not present

## 2021-07-07 DIAGNOSIS — F419 Anxiety disorder, unspecified: Secondary | ICD-10-CM

## 2021-07-07 DIAGNOSIS — Z23 Encounter for immunization: Secondary | ICD-10-CM

## 2021-07-07 DIAGNOSIS — F902 Attention-deficit hyperactivity disorder, combined type: Secondary | ICD-10-CM

## 2021-07-07 DIAGNOSIS — E559 Vitamin D deficiency, unspecified: Secondary | ICD-10-CM | POA: Diagnosis not present

## 2021-07-07 DIAGNOSIS — L508 Other urticaria: Secondary | ICD-10-CM

## 2021-07-07 LAB — CBC WITH DIFFERENTIAL/PLATELET
Basophils Absolute: 0 10*3/uL (ref 0.0–0.1)
Basophils Relative: 0.1 % (ref 0.0–3.0)
Eosinophils Absolute: 0.1 10*3/uL (ref 0.0–0.7)
Eosinophils Relative: 2 % (ref 0.0–5.0)
HCT: 41.3 % (ref 36.0–46.0)
Hemoglobin: 13.5 g/dL (ref 12.0–15.0)
Lymphocytes Relative: 16 % (ref 12.0–46.0)
Lymphs Abs: 1.2 10*3/uL (ref 0.7–4.0)
MCHC: 32.7 g/dL (ref 30.0–36.0)
MCV: 86.4 fl (ref 78.0–100.0)
Monocytes Absolute: 0.5 10*3/uL (ref 0.1–1.0)
Monocytes Relative: 6.9 % (ref 3.0–12.0)
Neutro Abs: 5.6 10*3/uL (ref 1.4–7.7)
Neutrophils Relative %: 75 % (ref 43.0–77.0)
Platelets: 308 10*3/uL (ref 150.0–400.0)
RBC: 4.79 Mil/uL (ref 3.87–5.11)
RDW: 13.5 % (ref 11.5–15.5)
WBC: 7.4 10*3/uL (ref 4.0–10.5)

## 2021-07-07 LAB — IBC PANEL
Iron: 107 ug/dL (ref 42–145)
Saturation Ratios: 29.2 % (ref 20.0–50.0)
TIBC: 366.8 ug/dL (ref 250.0–450.0)
Transferrin: 262 mg/dL (ref 212.0–360.0)

## 2021-07-07 LAB — COMPREHENSIVE METABOLIC PANEL
ALT: 14 U/L (ref 0–35)
AST: 18 U/L (ref 0–37)
Albumin: 4 g/dL (ref 3.5–5.2)
Alkaline Phosphatase: 98 U/L (ref 39–117)
BUN: 12 mg/dL (ref 6–23)
CO2: 29 mEq/L (ref 19–32)
Calcium: 9.2 mg/dL (ref 8.4–10.5)
Chloride: 105 mEq/L (ref 96–112)
Creatinine, Ser: 0.75 mg/dL (ref 0.40–1.20)
GFR: 91.45 mL/min (ref >60.00)
Glucose, Bld: 91 mg/dL (ref 70–99)
Potassium: 4.5 mEq/L (ref 3.5–5.1)
Sodium: 140 mEq/L (ref 135–145)
Total Bilirubin: 0.8 mg/dL (ref 0.2–1.2)
Total Protein: 6.9 g/dL (ref 6.0–8.3)

## 2021-07-07 LAB — VITAMIN D 25 HYDROXY (VIT D DEFICIENCY, FRACTURES): VITD: 25.87 ng/mL — ABNORMAL LOW (ref 30.00–100.00)

## 2021-07-07 LAB — POC URINALSYSI DIPSTICK (AUTOMATED)
Glucose, UA: NEGATIVE
Ketones, UA: NEGATIVE
Nitrite, UA: NEGATIVE
Protein, UA: POSITIVE — AB
Spec Grav, UA: >=1.03 — AB (ref 1.010–1.025)
Urobilinogen, UA: 0.2 E.U./dL
pH, UA: 5.5 (ref 5.0–8.0)

## 2021-07-07 LAB — LIPID PANEL
Cholesterol: 177 mg/dL (ref 0–200)
HDL: 55.3 mg/dL (ref >39.00)
LDL Cholesterol: 99 mg/dL (ref 0–99)
NonHDL: 121.8
Total CHOL/HDL Ratio: 3
Triglycerides: 113 mg/dL (ref 0.0–149.0)
VLDL: 22.6 mg/dL (ref 0.0–40.0)

## 2021-07-07 LAB — HEMOGLOBIN A1C: Hgb A1c MFr Bld: 5.7 % (ref 4.6–6.5)

## 2021-07-07 LAB — MICROALBUMIN / CREATININE URINE RATIO
Creatinine,U: 251.9 mg/dL
Microalb Creat Ratio: 0.6 mg/g (ref 0.0–30.0)
Microalb, Ur: 1.5 mg/dL (ref 0.0–1.9)

## 2021-07-07 LAB — FOLATE: Folate: 10.1 ng/mL (ref >5.9)

## 2021-07-07 LAB — FERRITIN: Ferritin: 38.8 ng/mL (ref 10.0–291.0)

## 2021-07-07 LAB — VITAMIN B12: Vitamin B-12: 675 pg/mL (ref 211–911)

## 2021-07-07 MED ORDER — BUPROPION HCL ER (SR) 100 MG PO TB12: 100.0000 mg | ORAL_TABLET | Freq: Every day | ORAL | 6 refills | Status: DC

## 2021-07-07 NOTE — Progress Notes (Signed)
Patient ID: Theresa Pratt, female    DOB: Mar 10, 1971, 50 y.o.   MRN: [id number]  This visit was conducted in person.  BP 128/86    Pulse 86    Temp (!) 97.4 F (36.3 C) (Temporal)    Ht 5\' 5"  (1.651 m)    Wt 231 lb (104.8 kg)    SpO2 98%    BMI 38.44 kg/m    CC: CPE Subjective:   HPI: Theresa Pratt is a 50 y.o. female presenting on 07/07/2021 for Annual Exam   Enjoying new job.  S/p gastric bypass (Roux-en-y 2011).   Vitamin B12 deficiency - continues monthly injections at home - last injection done 06/16/2021.   Lower back pain ongoing for 1 week managing with tylenol/advil with benefit. No UTI symptoms.   Preventative: Colon cancer screening - did not complete colonoscopy last year - will refer to [facility] per her request  Well woman - [place] saw Dr Roselie Awkward 02/2021. Last pap 2020 LMP - 05/2021, about to start cycle  Mammogram 02/2021 Birads1 at [facility]  Lung cancer screening - not eligible  Flu shot yearly  Td - 02/2009. Tdap 05/2020  COVID vaccine Moderna 09/2019, 10/2019, booster 05/2020 Shingrix - 05/2020, 08/2020 Seat belt use discussed  Sunscreen use discussed. No changing moles on skin.  Non smoker  Alcohol - wine/mixed drinks socially  Dentist - q6 mo  Eye exam yearly    Lives with parents, husband, 2 children and small dog   Occupation: new job at [employer], [place] Activity: did join gym but no regular exercise at this time  Diet: good water, fruits/vegetables daily      Relevant past medical, surgical, family and social history reviewed and updated as indicated. Interim medical history since our last visit reviewed. Allergies and medications reviewed and updated. Outpatient Medications Prior to Visit  Medication Sig Dispense Refill   ascorbic acid (VITAMIN C) 500 MG tablet Take 1 tablet (500 mg total) by mouth daily.     Calcium Magnesium Zinc 333-133-5 MG TABS Take 1 tablet by mouth daily.     cetirizine (ZYRTEC  ALLERGY) 10 MG tablet Take 1 tablet (10 mg total) by mouth daily. 90 tablet 1   Cholecalciferol (VITAMIN D3) 1.25 MG (50000 UT) CAPS TAKE 1 CAPSULE BY MOUTH ONE TIME PER WEEK 12 capsule 1   cyanocobalamin (,VITAMIN B-12,) 1000 MCG/ML injection Inject 1 mL (1,000 mcg total) into the muscle every 30 (thirty) days. 1 mL 6   famotidine (PEPCID) 20 MG tablet Take 1 tablet (20 mg total) by mouth 2 (two) times daily. 180 tablet 1   hydrochlorothiazide (HYDRODIURIL) 12.5 MG tablet TAKE 1 TABLET BY MOUTH EVERY DAY 90 tablet 1   hydrOXYzine (ATARAX/VISTARIL) 10 MG tablet TAKE 1 TABLET (10 MG TOTAL) BY MOUTH DAILY AS NEEDED FOR ITCHING (HIVES). 90 tablet 2   Imiquimod 3.75 % CREA Apply to affected area 2-3 times weekly for 6 weeks 7.5 g 0   Iron-Vitamins (GERITOL PO) Take 1 tablet by mouth daily.     LORazepam (ATIVAN) 0.5 MG tablet TAKE 1 TABLET BY MOUTH TWICE A DAY AS NEEDED FOR ANXIETY Strength: 0.5 mg 30 tablet 0   methylphenidate (CONCERTA) 18 MG PO CR tablet Take 1 tablet (18 mg total) by mouth daily. 30 tablet 0   metoprolol succinate (TOPROL-XL) 50 MG 24 hr tablet TAKE 1 TABLET (50 MG TOTAL) BY MOUTH DAILY. TAKE WITH OR IMMEDIATELY FOLLOWING A MEAL. [place]  tablet 1   Multiple Vitamin (MULTIVITAMIN) tablet Take 1 tablet by mouth daily.     Syringe/Needle, Disp, (SYRINGE 3CC/25GX1") 25G X 1" 3 ML MISC Use with B12 injection once monthly 1 each 6   thiamine 100 MG tablet Take 1 tablet (100 mg total) by mouth daily.     Turmeric 500 MG CAPS Take 1 capsule by mouth daily.     predniSONE (DELTASONE) 20 MG tablet Take two tablets daily for 3 days followed by one tablet daily for 4 days 10 tablet 0   No facility-administered medications prior to visit.     Per HPI unless specifically indicated in ROS section below Review of Systems  Constitutional:  Negative for activity change, appetite change, chills, fatigue, fever and unexpected weight change.  HENT:  Negative for hearing loss.   Eyes:  Negative for  visual disturbance.  Respiratory:  Negative for cough, chest tightness, shortness of breath and wheezing.   Cardiovascular:  Negative for chest pain, palpitations and leg swelling.  Gastrointestinal:  Negative for abdominal distention, abdominal pain, blood in stool, constipation, diarrhea, nausea and vomiting.  Genitourinary:  Negative for difficulty urinating and hematuria.  Musculoskeletal:  Negative for arthralgias, myalgias and neck pain.  Skin:  Negative for rash.  Neurological:  Negative for dizziness, seizures, syncope and headaches.  Hematological:  Negative for adenopathy. Does not bruise/bleed easily.  Psychiatric/Behavioral:  Negative for dysphoric mood. The patient is not nervous/anxious.    Objective:  BP 128/86    Pulse 86    Temp (!) 97.4 F (36.3 C) (Temporal)    Ht 5\' 5"  (1.651 m)    Wt 231 lb (104.8 kg)    SpO2 98%    BMI 38.44 kg/m   Wt Readings from Last 3 Encounters:  07/07/21 231 lb (104.8 kg)  02/14/21 230 lb 3.2 oz (104.4 kg)  01/25/21 232 lb (105.2 kg)      Physical Exam Vitals and nursing note reviewed.  Constitutional:      Appearance: Normal appearance. She is not ill-appearing.  HENT:     Head: Normocephalic and atraumatic.     Right Ear: Tympanic membrane, ear canal and external ear normal. There is no impacted cerumen.     Left Ear: Tympanic membrane, ear canal and external ear normal. There is no impacted cerumen.  Eyes:     General:        Right eye: No discharge.        Left eye: No discharge.     Extraocular Movements: Extraocular movements intact.     Conjunctiva/sclera: Conjunctivae normal.     Pupils: Pupils are equal, round, and reactive to light.  Neck:     Thyroid: No thyroid mass or thyromegaly.  Cardiovascular:     Rate and Rhythm: Normal rate and regular rhythm.     Pulses: Normal pulses.     Heart sounds: Normal heart sounds. No murmur heard. Pulmonary:     Effort: Pulmonary effort is normal. No respiratory distress.     Breath  sounds: Normal breath sounds. No wheezing, rhonchi or rales.  Abdominal:     General: Bowel sounds are normal. There is no distension.     Palpations: Abdomen is soft. There is no mass.     Tenderness: There is no abdominal tenderness. There is no guarding or rebound.     Hernia: No hernia is present.  Musculoskeletal:     Cervical back: Normal range of motion and neck supple. No rigidity.  Right lower leg: No edema.     Left lower leg: No edema.     Comments:  No pain midline spine No paraspinous mm tenderness Reproducible discomfort with seated SLR on right. No pain with int/ext rotation at hip  Lymphadenopathy:     Cervical: No cervical adenopathy.  Skin:    General: Skin is warm and dry.     Findings: No rash.  Neurological:     General: No focal deficit present.     Mental Status: She is alert. Mental status is at baseline.  Psychiatric:        Mood and Affect: Mood normal.        Behavior: Behavior normal.      Results for orders placed or performed in visit on 07/07/21  VITAMIN D 25 Hydroxy (Vit-D Deficiency, Fractures)  Result Value Ref Range   VITD 25.87 (L) 30.00 - 100.00 ng/mL  IBC panel  Result Value Ref Range   Iron 107 42 - 145 ug/dL   Transferrin 262.0 212.0 - 360.0 mg/dL   Saturation Ratios 29.2 20.0 - 50.0 %   TIBC 366.8 250.0 - 450.0 mcg/dL  Folate  Result Value Ref Range   Folate 10.1 >5.9 ng/mL  Vitamin B12  Result Value Ref Range   Vitamin B-12 675 211 - 911 pg/mL  Ferritin  Result Value Ref Range   Ferritin 38.8 10.0 - 291.0 ng/mL  Hemoglobin A1c  Result Value Ref Range   Hgb A1c MFr Bld 5.7 4.6 - 6.5 %  CBC with Differential/Platelet  Result Value Ref Range   WBC 7.4 4.0 - 10.5 K/uL   RBC 4.79 3.87 - 5.11 Mil/uL   Hemoglobin 13.5 12.0 - 15.0 g/dL   HCT 41.3 36.0 - 46.0 %   MCV 86.4 78.0 - 100.0 fl   MCHC 32.7 30.0 - 36.0 g/dL   RDW 13.5 11.5 - 15.5 %   Platelets 308.0 150.0 - 400.0 K/uL   Neutrophils Relative % 75.0 43.0 - 77.0 %    Lymphocytes Relative 16.0 12.0 - 46.0 %   Monocytes Relative 6.9 3.0 - 12.0 %   Eosinophils Relative 2.0 0.0 - 5.0 %   Basophils Relative 0.1 0.0 - 3.0 %   Neutro Abs 5.6 1.4 - 7.7 K/uL   Lymphs Abs 1.2 0.7 - 4.0 K/uL   Monocytes Absolute 0.5 0.1 - 1.0 K/uL   Eosinophils Absolute 0.1 0.0 - 0.7 K/uL   Basophils Absolute 0.0 0.0 - 0.1 K/uL  Lipid panel  Result Value Ref Range   Cholesterol 177 0 - 200 mg/dL   Triglycerides 113.0 0.0 - 149.0 mg/dL   HDL 55.30 >39.00 mg/dL   VLDL 22.6 0.0 - 40.0 mg/dL   LDL Cholesterol 99 0 - 99 mg/dL   Total CHOL/HDL Ratio 3    NonHDL 121.80   Comprehensive metabolic panel  Result Value Ref Range   Sodium 140 135 - 145 mEq/L   Potassium 4.5 3.5 - 5.1 mEq/L   Chloride 105 96 - 112 mEq/L   CO2 29 19 - 32 mEq/L   Glucose, Bld 91 70 - 99 mg/dL   BUN 12 6 - 23 mg/dL   Creatinine, Ser 0.75 0.40 - 1.20 mg/dL   Total Bilirubin 0.8 0.2 - 1.2 mg/dL   Alkaline Phosphatase 98 39 - 117 U/L   AST 18 0 - 37 U/L   ALT 14 0 - 35 U/L   Total Protein 6.9 6.0 - 8.3 g/dL   Albumin 4.0 3.5 -  5.2 g/dL   GFR 91.45 >60.00 mL/min   Calcium 9.2 8.4 - 10.5 mg/dL  Microalbumin / creatinine urine ratio  Result Value Ref Range   Microalb, Ur 1.5 0.0 - 1.9 mg/dL   Creatinine,U 251.9 mg/dL   Microalb Creat Ratio 0.6 0.0 - 30.0 mg/g  POCT Urinalysis Dipstick (Automated)  Result Value Ref Range   Color, UA dark yellow    Clarity, UA clear    Glucose, UA Negative Negative   Bilirubin, UA 1+    Ketones, UA negative    Spec Grav, UA >=1.030 (A) 1.010 - 1.025   Blood, UA +/-    pH, UA 5.5 5.0 - 8.0   Protein, UA Positive (A) Negative   Urobilinogen, UA 0.2 0.2 or 1.0 E.U./dL   Nitrite, UA negative    Leukocytes, UA Small (1+) (A) Negative    Assessment & Plan:  This visit occurred during the SARS-CoV-2 public health emergency.  Safety protocols were in place, including screening questions prior to the visit, additional usage of staff PPE, and extensive cleaning of  exam room while observing appropriate contact time as indicated for disinfecting solutions.   Problem List Items Addressed This Visit     Health maintenance examination - Primary (Chronic)     Preventative protocols reviewed and updated unless pt declined. Discussed healthy diet and lifestyle.       Essential hypertension    Chronic, stable on toprol XL      Vitamin D deficiency    Continue daily replacement.       BACK PAIN, LUMBAR    Acute low back pain x 1 wk associated with possible urinary urgency. UA today suspiciosu for infection - UCx sent.       Relevant Orders   POCT Urinalysis Dipstick (Automated) (Completed)   Urine Culture   Bariatric surgery status    Update bariatric labs.       Iron malabsorption    Update iron levels on MVI-iron daily.       Anxiety    Noted ongoing, recently worse due to family stressors. Discussed ideally recommended sparing lorazepam use. Given more frequent lorazepam use will trial daily anti-anxiety medication - start wellbutrin SR 100mg  daily, reviewed side effects to monitor for. No known seizure history.       Relevant Medications   buPROPion ER (WELLBUTRIN SR) 100 MG 12 hr tablet   Recurrent urticaria    Currently flared in setting of increased family stressors.  Discussed antihistamine use.       Attention deficit hyperactivity disorder (ADHD), combined type    Stable period on concerta - continue      Vitamin B12 deficiency    Continue b12 shots at home, latest injection 06/16/2021      Severe obesity (BMI 35.0-39.9) with comorbidity ([place])    Continue to encourage healthy diet and lifestyle choices to affect sustainable weight loss.       Thiamine deficiency   Other Visit Diagnoses     Need for influenza vaccination       Relevant Orders   Flu Vaccine QUAD 63mo+IM (Fluarix, Fluzone & Alfiuria Quad PF) (Completed)   Need for hepatitis C screening test       Special screening for malignant neoplasms, colon        Relevant Orders   Ambulatory referral to Gastroenterology        Meds ordered this encounter  Medications   buPROPion ER (WELLBUTRIN SR) 100 MG 12 hr tablet  Sig: Take 1 tablet (100 mg total) by mouth daily.    Dispense:  30 tablet    Refill:  6   Orders Placed This Encounter  Procedures   Urine Culture   Flu Vaccine QUAD 62mo+IM (Fluarix, Fluzone & Alfiuria Quad PF)   Ambulatory referral to Gastroenterology    Referral Priority:   Routine    Referral Type:   Consultation    Referral Reason:   Specialty Services Required    Number of Visits Requested:   1   POCT Urinalysis Dipstick (Automated)    Patient instructions: Labs today  For lower back - urinalysis today. May continue tylenol/ibuprofen as needed and ice/heat. Let us know if not improving with this.  Flu shot today  We will refer [facility] to [facility] for colonoscopy.  Good to see you today. Return as needed or in 1 year for next physical.  Try wellbutrin 100mg  SR once daily  Follow up plan: Return in about 1 year (around 07/07/2022), or if symptoms worsen or fail to improve.  [name], MD

## 2021-07-07 NOTE — Patient Instructions (Addendum)
Labs today  For lower back - urinalysis today. May continue tylenol/ibuprofen as needed and ice/heat. Let us know if not improving with this.  Flu shot today  We will refer you to [facility] for colonoscopy.  Good to see you today. Return as needed or in 1 year for next physical.  Try wellbutrin 100mg  SR once daily  Health Maintenance, Female Adopting a healthy lifestyle and getting preventive care are important in promoting health and wellness. Ask your health care provider about: The right schedule for you to have regular tests and exams. Things you can do on your own to prevent diseases and keep yourself healthy. What should I know about diet, weight, and exercise? Eat a healthy diet  Eat a diet that includes plenty of vegetables, fruits, low-fat dairy products, and lean protein. Do not eat a lot of foods that are high in solid fats, added sugars, or sodium. Maintain a healthy weight Body mass index (BMI) is used to identify weight problems. It estimates body fat based on height and weight. Your health care provider can help determine your BMI and help you achieve or maintain a healthy weight. Get regular exercise Get regular exercise. This is one of the most important things you can do for your health. Most adults should: Exercise for at least 150 minutes each week. The exercise should increase your heart rate and make you sweat (moderate-intensity exercise). Do strengthening exercises at least twice a week. This is in addition to the moderate-intensity exercise. Spend less time sitting. Even light physical activity can be beneficial. Watch cholesterol and blood lipids Have your blood tested for lipids and cholesterol at 52 years of age, then have this test every 5 years. Have your cholesterol levels checked more often if: Your lipid or cholesterol levels are high. You are older than 52 years of age. You are at high risk for heart disease. What should I know about cancer  screening? Depending on your health history and family history, you may need to have cancer screening at various ages. This may include screening for: Breast cancer. Cervical cancer. Colorectal cancer. Skin cancer. Lung cancer. What should I know about heart disease, diabetes, and high blood pressure? Blood pressure and heart disease High blood pressure causes heart disease and increases the risk of stroke. This is more likely to develop in people who have high blood pressure readings or are overweight. Have your blood pressure checked: Every 3-5 years if you are 70-36 years of age. Every year if you are 87 years old or older. Diabetes Have regular diabetes screenings. This checks your fasting blood sugar level. Have the screening done: Once every three years after age 87 if you are at a normal weight and have a low risk for diabetes. More often and at a younger age if you are overweight or have a high risk for diabetes. What should I know about preventing infection? Hepatitis B If you have a higher risk for hepatitis B, you should be screened for this virus. Talk with your health care provider to find out if you are at risk for hepatitis B infection. Hepatitis C Testing is recommended for: Everyone born from [birth year] through 1967. Anyone with known risk factors for hepatitis C. Sexually transmitted infections (STIs) Get screened for STIs, including gonorrhea and chlamydia, if: You are sexually active and are younger than 52 years of age. You are older than 52 years of age and your health care provider tells you that you are at risk for  this type of infection. Your sexual activity has changed since you were last screened, and you are at increased risk for chlamydia or gonorrhea. Ask your health care provider if you are at risk. Ask your health care provider about whether you are at high risk for HIV. Your health care provider may recommend a prescription medicine to help prevent HIV  infection. If you choose to take medicine to prevent HIV, you should first get tested for HIV. You should then be tested every 3 months for as long as you are taking the medicine. Pregnancy If you are about to stop having your period (premenopausal) and you may become pregnant, seek counseling before you get pregnant. Take 400 to 800 micrograms (mcg) of folic acid every day if you become pregnant. Ask for birth control (contraception) if you want to prevent pregnancy. Osteoporosis and menopause Osteoporosis is a disease in which the bones lose minerals and strength with aging. This can result in bone fractures. If you are 53 years old or older, or if you are at risk for osteoporosis and fractures, ask your health care provider if you should: Be screened for bone loss. Take a calcium or vitamin D supplement to lower your risk of fractures. Be given hormone replacement therapy (HRT) to treat symptoms of menopause. Follow these instructions at home: Alcohol use Do not drink alcohol if: Your health care provider tells you not to drink. You are pregnant, may be pregnant, or are planning to become pregnant. If you drink alcohol: Limit how much you have to: 0-1 drink a day. Know how much alcohol is in your drink. In the [place] one drink equals one 12 oz bottle of beer (355 mL), one 5 oz glass of wine (148 mL), or one 1 oz glass of hard liquor (44 mL). Lifestyle Do not use any products that contain nicotine or tobacco. These products include cigarettes, chewing tobacco, and vaping devices, such as e-cigarettes. If you need help quitting, ask your health care provider. Do not use street drugs. Do not share needles. Ask your health care provider for help if you need support or information about quitting drugs. General instructions Schedule regular health, dental, and eye exams. Stay current with your vaccines. Tell your health care provider if: You often feel depressed. You have ever been abused  or do not feel safe at home. Summary Adopting a healthy lifestyle and getting preventive care are important in promoting health and wellness. Follow your health care provider's instructions about healthy diet, exercising, and getting tested or screened for diseases. Follow your health care provider's instructions on monitoring your cholesterol and blood pressure. This information is not intended to replace advice given to you by your health care provider. Make sure you discuss any questions you have with your health care provider. Document Revised: 11/21/2020 Document Reviewed: 11/21/2020 Elsevier Patient Education  [place].

## 2021-07-07 NOTE — Assessment & Plan Note (Signed)
Preventative protocols reviewed and updated unless pt declined. Discussed healthy diet and lifestyle.  

## 2021-07-08 ENCOUNTER — Other Ambulatory Visit: Payer: Self-pay | Admitting: Family Medicine

## 2021-07-08 ENCOUNTER — Encounter: Payer: Self-pay | Admitting: Family Medicine

## 2021-07-08 LAB — URINE CULTURE
MICRO NUMBER:: 12795492
SPECIMEN QUALITY:: ADEQUATE

## 2021-07-08 MED ORDER — VITAMIN D 50 MCG (2000 UT) PO CAPS: 1.0000 | ORAL_CAPSULE | Freq: Every day | ORAL | Status: DC

## 2021-07-08 NOTE — Assessment & Plan Note (Signed)
Continue daily replacement 

## 2021-07-08 NOTE — Assessment & Plan Note (Signed)
Currently flared in setting of increased family stressors.  Discussed antihistamine use.

## 2021-07-08 NOTE — Assessment & Plan Note (Signed)
Stable period on concerta - continue

## 2021-07-08 NOTE — Assessment & Plan Note (Signed)
Acute low back pain x 1 wk associated with possible urinary urgency. UA today suspiciosu for infection - UCx sent.

## 2021-07-08 NOTE — Assessment & Plan Note (Signed)
Continue to encourage healthy diet and lifestyle choices to affect sustainable weight loss.  °

## 2021-07-08 NOTE — Assessment & Plan Note (Signed)
Continue b12 shots at home, latest injection 06/16/2021

## 2021-07-08 NOTE — Assessment & Plan Note (Signed)
Noted ongoing, recently worse due to family stressors. Discussed ideally recommended sparing lorazepam use. Given more frequent lorazepam use will trial daily anti-anxiety medication - start wellbutrin SR 100mg  daily, reviewed side effects to monitor for. No known seizure history.

## 2021-07-08 NOTE — Assessment & Plan Note (Addendum)
Update iron levels on MVI-iron daily.

## 2021-07-08 NOTE — Assessment & Plan Note (Signed)
Chronic, stable on toprol XL

## 2021-07-08 NOTE — Assessment & Plan Note (Signed)
Update bariatric labs.

## 2021-07-11 ENCOUNTER — Other Ambulatory Visit: Payer: Self-pay | Admitting: Family Medicine

## 2021-07-11 MED ORDER — VITAMIN D 50 MCG (2000 UT) PO CAPS: 2.0000 | ORAL_CAPSULE | Freq: Every day | ORAL | Status: DC

## 2021-07-12 LAB — HEPATITIS C ANTIBODY
Hepatitis C Ab: NONREACTIVE
SIGNAL TO CUT-OFF: 0.03

## 2021-07-12 LAB — VITAMIN B1: Vitamin B1 (Thiamine): 19 nmol/L (ref 8–30)

## 2021-08-07 ENCOUNTER — Other Ambulatory Visit: Payer: Self-pay | Admitting: Family Medicine

## 2021-08-07 NOTE — Telephone Encounter (Signed)
°  Encourage patient to contact the pharmacy for refills or they can request refills through [place]:  Please schedule appointment if longer than 1 year  NEXT APPOINTMENT DATE:06/27/22  MEDICATION:methylphenidate (CONCERTA) 18 MG PO CR tablet,LORazepam (ATIVAN) 0.5 MG tablet  Is the patient out of medication?   [place]  Let patient know to contact pharmacy at the end of the day to make sure medication is ready.  Please notify patient to allow 48-72 hours to process  CLINICAL FILLS OUT ALL BELOW:   LAST REFILL:  QTY:  REFILL DATE:    OTHER COMMENTS:    Okay for refill?  Please advise

## 2021-08-07 NOTE — Telephone Encounter (Addendum)
Concerta Last refill 79/39/68 #30 Last office visit 07/07/21  Lorazepam Last refill 06/28/21 #30

## 2021-08-10 MED ORDER — LORAZEPAM 0.5 MG PO TABS: ORAL_TABLET | ORAL | 0 refills | Status: DC

## 2021-08-10 MED ORDER — METHYLPHENIDATE HCL ER (OSM) 18 MG PO TBCR: 18.0000 mg | EXTENDED_RELEASE_TABLET | Freq: Every day | ORAL | 0 refills | Status: DC

## 2021-08-10 NOTE — Telephone Encounter (Signed)
ERx 

## 2021-09-13 ENCOUNTER — Other Ambulatory Visit: Payer: Self-pay | Admitting: Family Medicine

## 2021-09-13 MED ORDER — METHYLPHENIDATE HCL ER (OSM) 18 MG PO TBCR: 18.0000 mg | EXTENDED_RELEASE_TABLET | Freq: Every day | ORAL | 0 refills | Status: DC

## 2021-09-13 MED ORDER — LORAZEPAM 0.5 MG PO TABS: ORAL_TABLET | ORAL | 0 refills | Status: DC

## 2021-09-13 NOTE — Telephone Encounter (Signed)
ERx 

## 2021-09-13 NOTE — Telephone Encounter (Signed)
?  Encourage patient to contact the pharmacy for refills or they can request refills through [facility] ? ?Did the patient contact the pharmacy:   ?[place], [place] - [place] Phone:  [phone]  ?Fax:  [phone]  ?  ? ? ? ?LAST APPOINTMENT DATE:  12.23.22 ? ?NEXT APPOINTMENT DATE: 12.22.23 ? ?MEDICATION: methylphenidate (CONCERTA) 18 MG PO CR tablet ?LORazepam (ATIVAN) 0.5 MG tabletbuPROPion ER (WELLBUTRIN SR) 100 MG 12 hr tablet ? ?Is the patient out of medication? N ? ?If not, how much is left? ? ?Is this a 90 day supply:  ? ?PHARMACY:  ?[place], [place] Phone:  [phone]  ?Fax:  [phone]  ?  ? ? ?Let patient know to contact pharmacy at the end of the day to make sure medication is ready. ? ?Please notify patient to allow 48-72 hours to process ?  ?

## 2021-09-13 NOTE — Telephone Encounter (Signed)
Refill request ?Last office visit 07/07/21 ?Concerta 8/35/07 #57 ?Lorazepam 08/10/21 #30 ?

## 2021-09-15 ENCOUNTER — Telehealth: Payer: Self-pay

## 2021-09-15 MED ORDER — LORAZEPAM 0.5 MG PO TABS: ORAL_TABLET | ORAL | 0 refills | Status: DC

## 2021-09-15 MED ORDER — METHYLPHENIDATE HCL ER (OSM) 18 MG PO TBCR: 18.0000 mg | EXTENDED_RELEASE_TABLET | Freq: Every day | ORAL | 0 refills | Status: DC

## 2021-09-15 NOTE — Telephone Encounter (Signed)
Unable to reach pt by phone and I did speak with pts husband (DPR signed) and asked that he let pt know the 2 meds pt was requesting to be sent to [facility] in [place] was sent by Dr Darnell Level earlier. Mr Orama voiced understanding and will let pt know. Nothing further needed. ?

## 2021-09-15 NOTE — Telephone Encounter (Signed)
Patient called wanted to follow up on refill she had sent in for Concerta and Ativan. Reviewed chart and looks like it was sent to [facility] but patient has changed pharmacy to [facility]. I have updated in chart. Would like to have sent in today she is out of medications.  ?

## 2021-09-15 NOTE — Telephone Encounter (Signed)
Plz notify pt I've sent this in. Thanks.  ?

## 2021-10-10 ENCOUNTER — Telehealth: Payer: Self-pay | Admitting: Family Medicine

## 2021-10-10 MED ORDER — CYANOCOBALAMIN 1000 MCG/ML IJ SOLN: 1000.0000 ug | INTRAMUSCULAR | 2 refills | Status: DC

## 2021-10-10 NOTE — Telephone Encounter (Signed)
?  Encourage patient to contact the pharmacy for refills or they can request refills through [facility] ? ?Did the patient contact the pharmacy:  N ? ? ?LAST APPOINTMENT DATE:  12.23.22 ?NEXT APPOINTMENT DATE: 12.22.23 ? ?MEDICATION: cyanocobalamin (,VITAMIN B-12,) 1000 MCG/ML injection ? ?Is the patient out of medication? Y ? ?If not, how much is left? None left, no injection last month ? ?Is this a 90 day supply: Y ? ?PHARMACY:  ?[place], [place] Phone:  [phone]  ?Fax:  [phone]  ?  ? ? ?Let patient know to contact pharmacy at the end of the day to make sure medication is ready. ? ?

## 2021-10-10 NOTE — Telephone Encounter (Signed)
ERx 

## 2021-10-23 ENCOUNTER — Other Ambulatory Visit: Payer: Self-pay

## 2021-10-23 MED ORDER — LORAZEPAM 0.5 MG PO TABS: ORAL_TABLET | ORAL | 0 refills | Status: DC

## 2021-10-23 MED ORDER — METHYLPHENIDATE HCL ER (OSM) 18 MG PO TBCR: 18.0000 mg | EXTENDED_RELEASE_TABLET | Freq: Every day | ORAL | 0 refills | Status: DC

## 2021-10-23 NOTE — Telephone Encounter (Signed)
Patient called for refill on Ativan and Concerta. Last seen for CPE on 07/07/2021 ? ?Would like sent to [facility] in [place].  ?

## 2021-10-23 NOTE — Telephone Encounter (Signed)
ERx 

## 2021-10-23 NOTE — Telephone Encounter (Signed)
Last refill on both 09/15/21 #30 ?

## 2021-10-26 ENCOUNTER — Telehealth: Payer: Self-pay

## 2021-10-26 NOTE — Telephone Encounter (Signed)
New message    [facility] does not medication in stock they advise the patient to check with different pharmacy to see if they have medication in stock.   Patient voiced she is out of her medication    1. Which medications need to be refilled? (please list name of each medication and dose if known) methylphenidate (CONCERTA) 18 MG PO CR tablet  2. Which pharmacy/location (including street and city if local pharmacy) is [facility] [place]   3. Do they need a 30 day or 90 day supply? 30 day supply

## 2021-10-27 MED ORDER — METHYLPHENIDATE HCL ER (OSM) 18 MG PO TBCR: 18.0000 mg | EXTENDED_RELEASE_TABLET | Freq: Every day | ORAL | 0 refills | Status: DC

## 2021-10-27 NOTE — Telephone Encounter (Addendum)
Pt calling back stating she is ok to pick up a printed rx if need be to take to [facility].  ?

## 2021-10-27 NOTE — Telephone Encounter (Signed)
ERx concerta to [facility] ?

## 2021-10-27 NOTE — Addendum Note (Signed)
Addended by: [name] on: 10/27/2021 05:22 PM ? ? Modules accepted: Orders ? ?

## 2021-10-27 NOTE — Telephone Encounter (Signed)
Concerta---send to [facility] ?Last rx:  10/23/21, #30 to [facility]- out of stock ?Last OV:  07/07/21, CPE ?Next OV:  07/06/22, CPE ?

## 2021-11-19 ENCOUNTER — Emergency Department (HOSPITAL_BASED_OUTPATIENT_CLINIC_OR_DEPARTMENT_OTHER)
Admission: EM | Admit: 2021-11-19 | Discharge: 2021-11-19 | Disposition: A | Discharge status: Home / Self Care | Payer: BC Managed Care – PPO | Attending: Emergency Medicine | Admitting: Emergency Medicine

## 2021-11-19 ENCOUNTER — Other Ambulatory Visit: Payer: Self-pay

## 2021-11-19 ENCOUNTER — Encounter (HOSPITAL_BASED_OUTPATIENT_CLINIC_OR_DEPARTMENT_OTHER): Payer: Self-pay | Admitting: Emergency Medicine

## 2021-11-19 DIAGNOSIS — R5383 Other fatigue: Secondary | ICD-10-CM | POA: Diagnosis not present

## 2021-11-19 DIAGNOSIS — Z79899 Other long term (current) drug therapy: Secondary | ICD-10-CM | POA: Diagnosis not present

## 2021-11-19 DIAGNOSIS — R002 Palpitations: Secondary | ICD-10-CM | POA: Diagnosis not present

## 2021-11-19 DIAGNOSIS — R599 Enlarged lymph nodes, unspecified: Secondary | ICD-10-CM | POA: Diagnosis not present

## 2021-11-19 DIAGNOSIS — J029 Acute pharyngitis, unspecified: Secondary | ICD-10-CM | POA: Insufficient documentation

## 2021-11-19 DIAGNOSIS — L509 Urticaria, unspecified: Secondary | ICD-10-CM | POA: Insufficient documentation

## 2021-11-19 LAB — CBC WITH DIFFERENTIAL/PLATELET
Abs Immature Granulocytes: 0.03 10*3/uL (ref 0.00–0.07)
Basophils Absolute: 0 10*3/uL (ref 0.0–0.1)
Basophils Relative: 0 %
Eosinophils Absolute: 0.2 10*3/uL (ref 0.0–0.5)
Eosinophils Relative: 2 %
HCT: 42.3 % (ref 36.0–46.0)
Hemoglobin: 13.7 g/dL (ref 12.0–15.0)
Immature Granulocytes: 0 %
Lymphocytes Relative: 12 %
Lymphs Abs: 1.2 10*3/uL (ref 0.7–4.0)
MCH: 27.8 pg (ref 26.0–34.0)
MCHC: 32.4 g/dL (ref 30.0–36.0)
MCV: 86 fL (ref 80.0–100.0)
Monocytes Absolute: 0.8 10*3/uL (ref 0.1–1.0)
Monocytes Relative: 8 %
Neutro Abs: 7.8 10*3/uL — ABNORMAL HIGH (ref 1.7–7.7)
Neutrophils Relative %: 78 %
Platelets: 368 10*3/uL (ref 150–400)
RBC: 4.92 MIL/uL (ref 3.87–5.11)
RDW: 13.4 % (ref 11.5–15.5)
WBC: 10.1 10*3/uL (ref 4.0–10.5)
nRBC: 0 % (ref 0.0–0.2)

## 2021-11-19 LAB — BASIC METABOLIC PANEL
Anion gap: <3 — ABNORMAL LOW (ref 5–15)
BUN: 11 mg/dL (ref 6–20)
CO2: 28 mmol/L (ref 22–32)
Calcium: 8.8 mg/dL — ABNORMAL LOW (ref 8.9–10.3)
Chloride: 110 mmol/L (ref 98–111)
Creatinine, Ser: 0.91 mg/dL (ref 0.44–1.00)
GFR, Estimated: >60 mL/min (ref >60)
Glucose, Bld: 76 mg/dL (ref 70–99)
Potassium: 3.5 mmol/L (ref 3.5–5.1)
Sodium: 140 mmol/L (ref 135–145)

## 2021-11-19 LAB — GROUP A STREP BY PCR: Group A Strep by PCR: NOT DETECTED

## 2021-11-19 NOTE — ED Provider Notes (Signed)
?[facility] ?Provider Note ? ? ?CSN: [id number] ?Arrival date & time: 11/19/21  1542 ? ?  ? ?History ? ?Chief Complaint  ?Patient presents with  ? Sore Throat  ? ? ?Theresa Pratt is a 51 y.o. female.  She is here with a complaint of sore throat and swollen lymph nodes that started this morning when she woke up.  She said it feels like she had burned her throat with something.  She is also had chronic hives and noticed increased fatigue recently.  No fevers or chills.  Also has had some palpitations recently.  Her son was sick with a sore throat.  No difficulty swallowing or change in her voice. ? ?The history is provided by the patient.  ?Sore Throat ?This is a new problem. The current episode started 6 to 12 hours ago. The problem occurs constantly. The problem has not changed since onset.Pertinent negatives include no chest pain, no abdominal pain, no headaches and no shortness of breath. The symptoms are aggravated by swallowing. Nothing relieves the symptoms. She has tried rest for the symptoms. The treatment provided no relief.  ? ?  ? ?Home Medications ?Prior to Admission medications   ?Medication Sig Start Date End Date Taking? Authorizing Provider  ?ascorbic acid (VITAMIN C) 500 MG tablet Take 1 tablet (500 mg total) by mouth daily. 06/08/20   [name], MD  ?buPROPion ER [facility] SR) 100 MG 12 hr tablet Take 1 tablet (100 mg total) by mouth daily. 07/07/21   [name], MD  ?Calcium Magnesium Zinc 333-133-5 MG TABS Take 1 tablet by mouth daily. 06/08/20   [name], MD  ?cetirizine (ZYRTEC ALLERGY) 10 MG tablet Take 1 tablet (10 mg total) by mouth daily. 12/03/20   Coral Spikes, DO  ?Cholecalciferol (VITAMIN D) 50 MCG (2000 UT) CAPS Take 2 capsules (4,000 Units total) by mouth daily. 07/11/21   [name], MD  ?cyanocobalamin (,VITAMIN B-12,) 1000 MCG/ML injection Inject 1 mL (1,000 mcg total) into the muscle every 30 (thirty) days.  10/10/21   [name], MD  ?famotidine (PEPCID) 20 MG tablet Take 1 tablet (20 mg total) by mouth 2 (two) times daily. 12/03/20   Coral Spikes, DO  ?hydrochlorothiazide (HYDRODIURIL) 12.5 MG tablet TAKE 1 TABLET BY MOUTH EVERY DAY 01/13/21   [name], MD  ?hydrOXYzine (ATARAX/VISTARIL) 10 MG tablet TAKE 1 TABLET (10 MG TOTAL) BY MOUTH DAILY AS NEEDED FOR ITCHING (HIVES). 08/12/20   [name], MD  ?Imiquimod 3.75 % CREA Apply to affected area 2-3 times weekly for 6 weeks 02/14/21   Woodroe Mode, MD  ?Iron-Vitamins (GERITOL PO) Take 1 tablet by mouth daily.    [provider]  ?LORazepam (ATIVAN) 0.5 MG tablet TAKE 1 TABLET BY MOUTH TWICE A DAY AS NEEDED FOR ANXIETY Strength: 0.5 mg 10/23/21   [name], MD  ?methylphenidate (CONCERTA) 18 MG PO CR tablet Take 1 tablet (18 mg total) by mouth daily. 10/27/21   [name], MD  ?metoprolol succinate (TOPROL-XL) 50 MG 24 hr tablet TAKE 1 TABLET (50 MG TOTAL) BY MOUTH DAILY. TAKE WITH OR IMMEDIATELY FOLLOWING A MEAL. 01/13/21   [name], MD  ?Multiple Vitamin (MULTIVITAMIN) tablet Take 1 tablet by mouth daily.    [provider]  ?Syringe/Needle, Disp, (SYRINGE 3CC/25GX1") 25G X 1" 3 ML MISC Use with B12 injection once monthly 01/27/21   [name], MD  ?thiamine 100 MG tablet Take 1 tablet (100 mg total) by mouth  daily. 11/03/19   [name], MD  ?Turmeric 500 MG CAPS Take 1 capsule by mouth daily. 06/08/20   [name], MD  ?   ? ?Allergies    ?Sertraline and Sulfa antibiotics   ? ?Review of Systems   ?Review of Systems  ?Constitutional:  Positive for fatigue. Negative for fever.  ?HENT:  Positive for sore throat.   ?Eyes:  Negative for visual disturbance.  ?Respiratory:  Negative for shortness of breath.   ?Cardiovascular:  Positive for palpitations. Negative for chest pain.  ?Gastrointestinal:  Negative for abdominal pain.  ?Skin:  Positive for rash (hives).  ?Neurological:  Negative for  headaches.  ? ?Physical Exam ?Updated Vital Signs ?BP (!) 149/104   Pulse 99   Temp 98.5 ?F (36.9 ?C) (Oral)   Resp 16   SpO2 97%  ?Physical Exam ?Vitals and nursing note reviewed.  ?Constitutional:   ?   General: She is not in acute distress. ?   Appearance: Normal appearance. She is well-developed.  ?HENT:  ?   Head: Normocephalic and atraumatic.  ?   Mouth/Throat:  ?   Mouth: Mucous membranes are moist.  ?   Pharynx: Uvula midline. Posterior oropharyngeal erythema present. No oropharyngeal exudate.  ?   Tonsils: No tonsillar exudate or tonsillar abscesses.  ?Eyes:  ?   Conjunctiva/sclera: Conjunctivae normal.  ?Cardiovascular:  ?   Rate and Rhythm: Normal rate and regular rhythm.  ?   Heart sounds: No murmur heard. ?Pulmonary:  ?   Effort: Pulmonary effort is normal. No respiratory distress.  ?   Breath sounds: Normal breath sounds.  ?Abdominal:  ?   Palpations: Abdomen is soft.  ?   Tenderness: There is no abdominal tenderness.  ?Musculoskeletal:     ?   General: No swelling.  ?   Cervical back: Neck supple.  ?   Right lower leg: No edema.  ?   Left lower leg: No edema.  ?Skin: ?   General: Skin is warm and dry.  ?   Capillary Refill: Capillary refill takes less than 2 seconds.  ?Neurological:  ?   General: No focal deficit present.  ?   Mental Status: She is alert.  ? ? ?ED Results / Procedures / Treatments   ?Labs ?(all labs ordered are listed, but only abnormal results are displayed) ?Labs Reviewed  ?BASIC METABOLIC PANEL - Abnormal; Notable for the following components:  ?    Result Value  ? Calcium 8.8 (*)   ? Anion gap <3 (*)   ? All other components within normal limits  ?CBC WITH DIFFERENTIAL/PLATELET - Abnormal; Notable for the following components:  ? Neutro Abs 7.8 (*)   ? All other components within normal limits  ?GROUP A STREP BY PCR  ? ? ?EKG ?None ? ?Radiology ?No results found. ? ?Procedures ?Procedures  ? ? ?Medications Ordered in ED ?Medications - No data to display ? ?ED Course/ Medical  Decision Making/ A&P ?  ?                        ?Medical Decision Making ?Amount and/or Complexity of Data Reviewed ?Labs: ordered. ? ?This patient complains of fatigue and sore throat; this involves an extensive number of treatment ?Options and is a complaint that carries with it a high risk of complications and ?morbidity. The differential includes strep throat, anemia, metabolic derangement, viral syndrome, COVID, flu ? ?I ordered, reviewed and interpreted labs, which included CBC with  normal white count normal hemoglobin, chemistries normal other than mildly low calcium, strep negative ?Previous records obtained and reviewed in epic no recent admissions ? ?Social determinants considered, no significant barriers ?Critical Interventions: None ? ?After the interventions stated above, I reevaluated the patient and found patient to be nontoxic-appearing ?Admission and further testing considered, no indications for admission or further work-up this time.  Recommended symptomatic treatment and close follow-up with PCP.  Return instructions discussed ? ? ? ? ? ? ? ? ? ?Final Clinical Impression(s) / ED Diagnoses ?Final diagnoses:  ?Viral pharyngitis  ?Fatigue, unspecified type  ? ? ?Rx / DC Orders ?ED Discharge Orders   ? ? None  ? ?  ? ? ?  ?Hayden Rasmussen, MD ?11/20/21 1047 ? ?

## 2021-11-19 NOTE — Discharge Instructions (Signed)
You were seen in the emergency department for fatigue and sore throat.  Your strep test was negative and your lab work did not show any significant abnormalities.  Please continue your regular medications.  Warm salt water gargles.  Tylenol as needed for pain.  Follow-up with your primary care doctor.  Return if any worsening or concerning symptoms. ?

## 2021-11-19 NOTE — ED Triage Notes (Signed)
Pt reports sore throat and swollen neck lymph nodes today. Otherwise says she says she feels tired.  ?

## 2021-11-28 ENCOUNTER — Telehealth: Payer: Self-pay | Admitting: Family Medicine

## 2021-11-28 ENCOUNTER — Other Ambulatory Visit: Payer: Self-pay | Admitting: Family Medicine

## 2021-11-28 NOTE — Telephone Encounter (Signed)
Name of Medication: Lorazepam ?Name of Pharmacy: [facility] ?Last Fill or Written Date and Quantity: 10/23/21, #30 ?Last Office Visit and Type: 07/07/21, CPE ?Next Office Visit and Type: 07/06/22, CPE ?Last Controlled Substance Agreement Date: 10/30/17 ?Last UDS: 10/30/17 ?

## 2021-11-28 NOTE — Telephone Encounter (Signed)
Name of Medication: Concerta ?Name of Pharmacy: [facility] ?Last Fill or Written Date and Quantity: 10/27/21, #30 ?Last Office Visit and Type: 07/07/21, CPE ?Next Office Visit and Type: 07/06/22, CPE ?Last Controlled Substance Agreement Date: 10/30/17 ?Last UDS: 10/30/17 ? ? ?

## 2021-11-28 NOTE — Telephone Encounter (Signed)
Encourage patient to contact the pharmacy for refills or they can request refills through [facility] ? ?Did the patient contact the pharmacy:  No ? ?LAST APPOINTMENT DATE:  12.23.2022 ? ?MEDICATION:  methylphenidate (CONCERTA) 18 MG PO CR tablet ? ?Is the patient out of medication? Yes ? ?If not, how much is left? N/A ? ?Is this a 90 day supply: 30 day supply ? ?PHARMACY:  [id number] [place] [address]?Phone Number: [phone]? ?Let patient know to contact pharmacy at the end of the day to make sure medication is ready. ? ?Please notify patient to allow 48-72 hours to process ? ?

## 2021-11-28 NOTE — Telephone Encounter (Signed)
Encourage patient to contact the pharmacy for refills or they can request refills through [facility] ? ?Did the patient contact the pharmacy:  No ? ?LAST APPOINTMENT DATE:  12.23.2022 ? ?MEDICATION: LORazepam (ATIVAN) 0.5 MG tablet ? ?Is the patient out of medication? Not sure ? ?If not, how much is left? N/A ? ?Is this a 90 day supply: No, 30 day supply ? ?[facility]ACY[id number] [place] [place] [facility] [address]?Phone Number: [phone]? ?Let patient know to contact pharmacy at the end of the day to make sure medication is ready. ? ?Please notify patient to allow 48-72 hours to process ?  ?

## 2021-11-29 MED ORDER — METHYLPHENIDATE HCL ER (OSM) 18 MG PO TBCR: 18.0000 mg | EXTENDED_RELEASE_TABLET | Freq: Every day | ORAL | 0 refills | Status: DC

## 2021-11-29 MED ORDER — LORAZEPAM 0.5 MG PO TABS: ORAL_TABLET | ORAL | 0 refills | Status: DC

## 2021-11-29 NOTE — Telephone Encounter (Signed)
ERx 

## 2021-11-30 MED ORDER — METHYLPHENIDATE HCL ER (OSM) 18 MG PO TBCR: 18.0000 mg | EXTENDED_RELEASE_TABLET | Freq: Every day | ORAL | 0 refills | Status: DC

## 2021-11-30 NOTE — Telephone Encounter (Signed)
Rx sent to [facility] on 11/29/21.  Spoke with Jaci Standard at [facility] asking if Concerta rx was received.  Confirms it was but they are out due to med backorder.   Lvm asking pt to call back.  Need to know if rx was to go to [facility] or [facility].

## 2021-11-30 NOTE — Telephone Encounter (Signed)
[facility] Concerta. I made the pt aware that it might take up to 2 days to go through. She wanted to let Dr. Darnell Level and the nurse know that [facility] has not [facility].  Callback Number: [phone]

## 2021-11-30 NOTE — Telephone Encounter (Signed)
[facility] and [facility] name brand and it was just need to be sent in for that

## 2021-11-30 NOTE — Telephone Encounter (Signed)
[facility]

## 2021-11-30 NOTE — Addendum Note (Signed)
Addended by: [name] on: 11/30/2021 07:15 PM   Modules accepted: Orders

## 2021-12-01 NOTE — Telephone Encounter (Signed)
Left message on vm per dpr notifying pt rx was sent to [facility].

## 2021-12-04 MED ORDER — CONCERTA 18 MG PO TBCR: 18.0000 mg | EXTENDED_RELEASE_TABLET | Freq: Every day | ORAL | 0 refills | Status: DC

## 2021-12-04 NOTE — Telephone Encounter (Signed)
Left message on vm per dpr relaying Dr. G's message.  

## 2021-12-04 NOTE — Telephone Encounter (Signed)
Patient called back stating they can fill the brand name for Concerta not generic, that is all that is available right now. Patient would like to know if Dr Darnell Level can re send as brand name and she will see if the insurance will pay.

## 2021-12-04 NOTE — Telephone Encounter (Signed)
I've sent in Brand Concerta. Doubtful insurance will cover but will see.

## 2021-12-04 NOTE — Addendum Note (Signed)
Addended by: [name] on: 12/04/2021 09:00 AM   Modules accepted: Orders

## 2021-12-04 NOTE — Telephone Encounter (Signed)
Noted  

## 2021-12-04 NOTE — Telephone Encounter (Signed)
Patient returned the call, relayed message from Dr. Darnell Level as written below

## 2021-12-05 ENCOUNTER — Telehealth: Payer: Self-pay | Admitting: Family Medicine

## 2021-12-05 NOTE — Telephone Encounter (Signed)
Pt called and wanted to let the nurse know that "this medication: CONCERTA 18 MG CR tablet will end up being $300 and she does not want to pay that. Wants to know if she can switch to something else that is more reasonable." Please return a call back when possible.  Callback Number: [phone]

## 2021-12-08 MED ORDER — DEXMETHYLPHENIDATE HCL ER 10 MG PO CP24: 10.0000 mg | ORAL_CAPSULE | Freq: Every day | ORAL | 0 refills | Status: DC

## 2021-12-08 NOTE — Telephone Encounter (Signed)
Price out new stimulant focalin XR sent to pharmacy. Also recommend she contact pharmacist to get their recommendations on alternative substitute while methylphenidate unavailable.

## 2021-12-12 NOTE — Telephone Encounter (Signed)
Started prior auth for Dexmethylphenidate HCl ER '10MG'$  er capsules. Yaretzy Westerfeld Key: [id number] - Rx #: [id number] Waiting for determination.

## 2021-12-13 NOTE — Telephone Encounter (Signed)
Spoke with pt notifying Focalin XR prior authorization was approved.  Per Dr. Darnell Level, asked pt to price it out.  If not affordable, ask pharmacy for recommendations on alternative substitute while methylphenidate is unavailable.  Pt verbalizes understanding.

## 2021-12-13 NOTE — Telephone Encounter (Signed)
Prior auth approved for Dexmethylphenidate HCl ER '10MG'$  er capsules.  [facility]  received a request from your provider for coverage of Dexmethylphenidate ER '10mg'$  Capsule. As long as you remain covered by the [facility] and there are no changes to your plan benefits, this request is approved for the following time period: 12/12/2021 - 12/12/2024

## 2021-12-16 ENCOUNTER — Other Ambulatory Visit: Payer: Self-pay

## 2021-12-16 ENCOUNTER — Encounter: Payer: Self-pay | Admitting: Emergency Medicine

## 2021-12-16 ENCOUNTER — Ambulatory Visit
Admission: EM | Admit: 2021-12-16 | Discharge: 2021-12-16 | Disposition: A | Discharge status: Home / Self Care | Payer: BC Managed Care – PPO | Attending: Emergency Medicine | Admitting: Emergency Medicine

## 2021-12-16 DIAGNOSIS — L500 Allergic urticaria: Secondary | ICD-10-CM

## 2021-12-16 MED ORDER — HYDROXYZINE HCL 10 MG PO TABS: 10.0000 mg | ORAL_TABLET | Freq: Every day | ORAL | 2 refills | Status: DC | PRN

## 2021-12-16 MED ORDER — PREDNISONE 10 MG (21) PO TBPK: ORAL_TABLET | ORAL | 0 refills | Status: DC

## 2021-12-16 NOTE — ED Provider Notes (Signed)
[facility]    CSN: [id number] Arrival date & time: 12/16/21  0801      History   Chief Complaint Chief Complaint  Patient presents with   Urticaria    HPI Theresa Pratt is a 51 y.o. female.   HPI  81 old female here for evaluation of itching.  Patient has a history of chronic urticaria of unknown origin.  She states that she has had multiple skin tests and sees an allergy specialist.  She typically uses hydroxyzine to help with itching but she is currently out.  She has been taking over-the-counter cetirizine without any improvement of her symptoms.  She does state that when she gets this bad she typically has to have some prednisone as well.  Her current hives have been going on for the past week.  She has wheals on both arms, her chest, her abdomen, and her back.  She states that yesterday her lips were swollen but not today.  She denies any sore throat or shortness of breath.  Past Medical History:  Diagnosis Date   ADHD, adult residual type    Allergic rhinitis    post gastric bypass   Allergy    Anxiety    Arthropathy    weight bearing (pre gastric bypass)   Chronic urticaria    Diarrhea, functional    DM (diabetes mellitus) ([place])    pre gastric bypass, pt states she has not been treated for it in years, her doctor doesn't discuss it with her anymore   GERD (gastroesophageal reflux disease)    post gastric bypass   Heart murmur    had it in the past   History of chicken pox    HTN (hypertension)    Pre gastric bypass   HTN (hypertension)    post gastric bypass   IDA (iron deficiency anemia)    post gastric bypass (iron infusion by hematology-doesn't build stores -Dr. 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   Oligomenorrhea    on Implanon (pre gastric bypass)   PONV (postoperative nausea and vomiting)    after 1st surgery   Postpartum depression    post gastric bypass   Pre-eclampsia     post gastric bypass   Restless legs     Patient Active Problem List   Diagnosis Date Noted   Immunization deficiency 24/40/1027   Medical certificate issuance 06/08/2020   Epidermal cyst 04/26/2020   Leg swelling 11/03/2019   Thiamine deficiency 10/22/2019   Left arm weakness 12/22/2018   Severe obesity (BMI 35.0-39.9) with comorbidity ([place]) 06/03/2018   History of open reduction and internal fixation (ORIF) procedure 02/21/2017   Displaced fracture of lateral malleolus of left fibula, initial encounter for closed fracture 02/19/2017   Vitamin B12 deficiency 12/21/2016   Attention deficit hyperactivity disorder (ADHD), 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   Recurrent urticaria 03/05/2016   PTSD (post-traumatic stress disorder) 02/28/2016   Anxiety 06/29/2015   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   Work-related stress 04/28/2014   Menorrhagia 07/04/2012   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   Health maintenance examination 06/17/2012   BACK PAIN, LUMBAR 07/14/2010   Vitamin D deficiency 07/03/2010   ALLERGIC RHINITIS 07/03/2010   GERD 07/03/2010   Bariatric surgery status 07/03/2010   Essential hypertension 06/22/2010    Past Surgical History:  Procedure Laterality Date   CHOLECYSTECTOMY  08/2010   GASTRIC BYPASS  2003   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 Authorizing Provider  cetirizine (ZYRTEC ALLERGY) 10  MG tablet Take 1 tablet (10 mg total) by mouth daily. 12/03/20  Yes Cook, Barnie Del, DO  Cholecalciferol (VITAMIN D) 50 MCG (2000 UT) CAPS Take 2 capsules (4,000 Units total) by mouth daily. 07/11/21  Yes [name], MD  cyanocobalamin (,VITAMIN B-12,) 1000 MCG/ML injection Inject 1 mL (1,000 mcg total) into the muscle every 30 (thirty) days. 10/10/21  Yes [name], MD  dexmethylphenidate (FOCALIN XR) 10 MG 24 hr capsule Take 1 capsule (10 mg total) by mouth daily. 12/08/21  Yes [name], MD  famotidine (PEPCID) 20 MG tablet Take 1 tablet (20 mg total) by mouth 2 (two) times daily. 12/03/20  Yes Cook, Jayce G, DO  hydrochlorothiazide (HYDRODIURIL) 12.5 MG tablet TAKE 1 TABLET BY MOUTH EVERY DAY 01/13/21  Yes [name], MD  metoprolol succinate (TOPROL-XL) 50 MG 24 hr tablet TAKE 1 TABLET (50 MG TOTAL) BY MOUTH DAILY. TAKE WITH OR IMMEDIATELY FOLLOWING A MEAL. 01/13/21  Yes [name], MD  Multiple Vitamin (MULTIVITAMIN) tablet Take 1 tablet by mouth daily.   Yes [provider]  predniSONE (STERAPRED UNI-PAK 21 TAB) 10 MG (21) TBPK tablet Take 6 tablets on day 1, 5 tablets day 2, 4 tablets day 3, 3 tablets day 4, 2 tablets day 5, 1 tablet day 6 12/16/21  Yes Margarette Canada, NP  ascorbic acid (VITAMIN C) 500 MG tablet Take 1 tablet (500 mg total) by mouth daily. 06/08/20   [name], MD  buPROPion ER [facility] SR) 100 MG 12 hr tablet Take 1 tablet (100 mg total) by mouth daily. 07/07/21   [name], MD  Calcium Magnesium Zinc 333-133-5 MG TABS Take 1 tablet by mouth daily. 06/08/20   [name], MD  hydrOXYzine (ATARAX) 10 MG tablet Take 1 tablet (10 mg total) by mouth daily as needed for itching (hives). 12/16/21   Margarette Canada, NP  Imiquimod 3.75 % CREA Apply to affected area 2-3 times weekly for 6 weeks 02/14/21   Woodroe Mode, MD  Iron-Vitamins (GERITOL PO) Take 1 tablet by mouth daily.    [provider]  LORazepam (ATIVAN) 0.5 MG  tablet TAKE 1 TABLET BY MOUTH TWICE A DAY AS NEEDED FOR ANXIETY Strength: 0.5 mg 11/29/21   [name], MD  Syringe/Needle, Disp, (SYRINGE 3CC/25GX1") 25G X 1" 3 ML MISC Use with B12 injection once monthly 01/27/21   [name], MD  thiamine 100 MG tablet Take 1 tablet (100 mg total) by mouth daily. 11/03/19   [name], MD  Turmeric 500 MG CAPS Take 1 capsule  by mouth daily. 06/08/20   [name], MD    Family History Family History  Problem Relation Age of Onset   Coronary artery disease Father        2 stents; + smoker   Hypertension Father    Thyroid disease Brother    Diabetes Maternal Grandmother    Cirrhosis Paternal Grandmother    Alcohol abuse Paternal Grandmother    Breast cancer Other        Paternal great aunt(obesity, smoker, EtOHic)   Breast cancer Other        Maternal great aunt (deceased in 27's from same)   Thyroid disease Other        cousins and grandmother   Cancer Other        great aunt - lung   Liver disease Paternal Aunt    Cancer Cousin        liver, bile duct   Cancer Mother        uterine   Uterine cancer Mother    Colon polyps Neg Hx    Colon cancer Neg Hx    Esophageal cancer Neg Hx    Rectal cancer Neg Hx    Stomach cancer Neg Hx     Social History Social History   Tobacco Use   Smoking status: Never   Smokeless tobacco: Never  Vaping Use   Vaping Use: Never used  Substance Use Topics   Alcohol use: Yes    Comment: occasional   Drug use: No     Allergies   Sertraline and Sulfa antibiotics   Review of Systems Review of Systems  HENT:  Positive for facial swelling and voice change. Negative for trouble swallowing.   Respiratory:  Negative for shortness of breath and stridor.   Skin:  Positive for rash.  Hematological: Negative.   Psychiatric/Behavioral: Negative.      Physical Exam Triage Vital Signs ED Triage Vitals  Enc Vitals Group     BP 12/16/21 0812 (!) 136/92     Pulse Rate 12/16/21  0812 98     Resp 12/16/21 0812 14     Temp 12/16/21 0812 98.4 F (36.9 C)     Temp Source 12/16/21 0812 Oral     SpO2 12/16/21 0812 95 %     Weight 12/16/21 0808 231 lb 0.7 oz (104.8 kg)     Height 12/16/21 0808 '5\' 5"'$  (1.651 m)     Head Circumference --      Peak Flow --      Pain Score 12/16/21 0808 6     Pain Loc --      Pain Edu? --      Excl. in [place]? --    No data found.  Updated Vital Signs BP (!) 136/92 (BP Location: Right Arm)   Pulse 98   Temp 98.4 F (36.9 C) (Oral)   Resp 14   Ht '5\' 5"'$  (1.651 m)   Wt 231 lb 0.7 oz (104.8 kg)   SpO2 95%   BMI 38.45 kg/m   Visual Acuity Right Eye Distance:   Left Eye Distance:   Bilateral Distance:    Right Eye Near:   Left Eye Near:    Bilateral Near:     Physical Exam Vitals and nursing note reviewed.  Constitutional:      Appearance: Normal appearance. She is not ill-appearing.  HENT:     Head: Normocephalic and atraumatic.     Mouth/Throat:     Mouth: Mucous membranes  are moist.     Pharynx: Oropharynx is clear. No oropharyngeal exudate or posterior oropharyngeal erythema.  Cardiovascular:     Rate and Rhythm: Normal rate and regular rhythm.     Pulses: Normal pulses.     Heart sounds: Normal heart sounds. No murmur heard.   No friction rub. No gallop.  Pulmonary:     Effort: Pulmonary effort is normal.     Breath sounds: Normal breath sounds. No stridor. No wheezing, rhonchi or rales.  Musculoskeletal:     Cervical back: Normal range of motion and neck supple.  Lymphadenopathy:     Cervical: No cervical adenopathy.  Skin:    General: Skin is warm and dry.     Capillary Refill: Capillary refill takes less than 2 seconds.     Findings: Erythema and rash present.  Neurological:     General: No focal deficit present.     Mental Status: She is alert and oriented to person, place, and time.  Psychiatric:        Mood and Affect: Mood normal.        Behavior: Behavior normal.        Thought Content: Thought  content normal.        Judgment: Judgment normal.     UC Treatments / Results  Labs (all labs ordered are listed, but only abnormal results are displayed) Labs Reviewed - No data to display  EKG   Radiology No results found.  Procedures Procedures (including critical care time)  Medications Ordered in UC Medications - No data to display  Initial Impression / Assessment and Plan / UC Course  I have reviewed the triage vital signs and the nursing notes.  Pertinent labs & imaging results that were available during my care of the patient were reviewed by me and considered in my medical decision making (see chart for details).  Patient is a pleasant, nontoxic-appearing 51 year old female here for evaluation of urticaria that have been going for the past week.  She has a history of chronic urticaria she currently sees an allergy specialist.  She has been prescribed hydroxyzine to help with itching in the past but she is currently out and she is requesting a refill.  She does have an upcoming appointment with her allergy specialist.  She does endorse that she had some lip swelling yesterday and she is says she sounds hoarse to her today.  She has not had any sore throat, shortness of breath, or throat tightness.  On exam patient does have several large wheals on both arms, upper chest, left side of her neck, and back.  She indicates that she has the same below and underneath her breasts and across her abdomen.  Patient has no appreciable stridor when auscultating over the trachea and her lung sounds are clear to auscultation in all fields.  I will refill her hydroxyzine 10 mg and also place her on a prednisone pack to help with the urticaria.  I have suggested to her that she be tested for alpha gal given that she has urticaria of unknown origin.  Patient does report that she does develop issues when she eats meat.  Return precautions reviewed.   Final Clinical Impressions(s) / UC Diagnoses    Final diagnoses:  Allergic urticaria     Discharge Instructions      Take over-the-counter Zyrtec 10 mg daily to help with your itching.  Take the Hydroxyzine as needed for itching and hives.  Take the prednisone pack according  to the package instructions.  You will taken on tapering dose over a period of 6 days.  Take it with food and always take it first in the morning with breakfast.  Take over-the-counter Pepcid 20 mg twice daily to help with itching as well.  Ask your allergy specialist about being tested for Alpha-gal.  If you develop any swelling of your lips or tongue, tightness in your throat, or difficulty breathing you need to go to the ER for evaluation.      ED Prescriptions     Medication Sig Dispense Auth. Provider   hydrOXYzine (ATARAX) 10 MG tablet Take 1 tablet (10 mg total) by mouth daily as needed for itching (hives). 90 tablet Margarette Canada, NP   predniSONE (STERAPRED UNI-PAK 21 TAB) 10 MG (21) TBPK tablet Take 6 tablets on day 1, 5 tablets day 2, 4 tablets day 3, 3 tablets day 4, 2 tablets day 5, 1 tablet day 6 21 tablet Margarette Canada, NP      PDMP not reviewed this encounter.   Margarette Canada, NP 12/16/21 [phone]

## 2021-12-16 NOTE — ED Triage Notes (Signed)
Patient states that she has chronic hives.  Patient states that this started a week ago.  Patient states that she is out of hydroxyzine.  Patient states that she has been taking OTC allergy medicine.  Patient reports pain and tenderness at certain spots on her hands and back.

## 2021-12-16 NOTE — Discharge Instructions (Signed)
Take over-the-counter Zyrtec 10 mg daily to help with your itching.  Take the Hydroxyzine as needed for itching and hives.  Take the prednisone pack according to the package instructions.  You will taken on tapering dose over a period of 6 days.  Take it with food and always take it first in the morning with breakfast.  Take over-the-counter Pepcid 20 mg twice daily to help with itching as well.  Ask your allergy specialist about being tested for Alpha-gal.  If you develop any swelling of your lips or tongue, tightness in your throat, or difficulty breathing you need to go to the ER for evaluation.

## 2021-12-20 ENCOUNTER — Telehealth: Payer: BC Managed Care – PPO | Admitting: Family Medicine

## 2021-12-20 ENCOUNTER — Ambulatory Visit
Admission: RE | Admit: 2021-12-20 | Discharge: 2021-12-20 | Disposition: A | Discharge status: Home / Self Care | Payer: BC Managed Care – PPO | Source: Ambulatory Visit | Attending: Family Medicine | Admitting: Family Medicine

## 2021-12-20 VITALS — BP 150/98 | HR 72 | Temp 98.0°F | Resp 18 | Ht 65.0 in | Wt 231.0 lb

## 2021-12-20 DIAGNOSIS — L508 Other urticaria: Secondary | ICD-10-CM | POA: Diagnosis not present

## 2021-12-20 MED ORDER — PREDNISONE 20 MG PO TABS: ORAL_TABLET | ORAL | 0 refills | Status: DC

## 2021-12-20 NOTE — ED Triage Notes (Signed)
Patient c/o allergic reaction, hives all over, seen on Saturday, given Prednisone has completed course.  Itching all over, taken Hydroxine.

## 2021-12-20 NOTE — ED Provider Notes (Signed)
[facility]    CSN: [id number] Arrival date & time: 12/20/21  1436      History   Chief Complaint Chief Complaint  Patient presents with   Allergic Reaction    Return visit: Please help my hives have not stopped - Entered by patient    HPI Theresa Pratt is a 51 y.o. female.    Allergic Reaction Here with worsening of her chronic urticaria since yesterday. She was seen here June 3 and given a prednisone pack.  Her urticaria did improve but then worsened again yesterday when her dose has been lowered on the pack.  She had some time where she felt hoarse and pressure in her chest.  That has improved  No fever or chills  She did have some vomiting 1 morning  Past Medical History:  Diagnosis Date   ADHD, adult residual type    Allergic rhinitis    post gastric bypass   Allergy    Anxiety    Arthropathy    weight bearing (pre gastric bypass)   Chronic urticaria    Diarrhea, functional    DM (diabetes mellitus) ([place])    pre gastric bypass, pt states she has not been treated for it in years, her doctor doesn't discuss it with her anymore   GERD (gastroesophageal reflux disease)    post gastric bypass   Heart murmur    had it in the past   History of chicken pox    HTN (hypertension)    Pre gastric bypass   HTN (hypertension)    post gastric bypass   IDA (iron deficiency anemia)    post gastric bypass (iron infusion by hematology-doesn't build stores -Dr. 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   Oligomenorrhea    on Implanon (pre gastric bypass)   PONV (postoperative nausea and vomiting)    after 1st surgery   Postpartum depression    post gastric bypass   Pre-eclampsia    post gastric bypass   Restless legs     Patient Active Problem List   Diagnosis Date Noted   Immunization deficiency 25/36/6440   Medical certificate issuance 06/08/2020   Epidermal cyst 04/26/2020   Leg swelling  11/03/2019   Thiamine deficiency 10/22/2019   Left arm weakness 12/22/2018   Severe obesity (BMI 35.0-39.9) with comorbidity ([place]) 06/03/2018   History of open reduction and internal fixation (ORIF) procedure 02/21/2017   Displaced fracture of lateral malleolus of left fibula, initial encounter for closed fracture 02/19/2017   Vitamin B12 deficiency 12/21/2016   Attention deficit hyperactivity disorder (ADHD), 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   Recurrent urticaria 03/05/2016   PTSD (post-traumatic stress disorder) 02/28/2016   Anxiety 06/29/2015   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   Work-related stress 04/28/2014   Menorrhagia 07/04/2012   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   Health maintenance examination 06/17/2012   BACK PAIN, LUMBAR 07/14/2010   Vitamin D deficiency 07/03/2010   ALLERGIC RHINITIS 07/03/2010   GERD 07/03/2010   Bariatric surgery status 07/03/2010   Essential hypertension 06/22/2010    Past Surgical History:  Procedure Laterality Date   CHOLECYSTECTOMY  08/2010   GASTRIC BYPASS  2003   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 Authorizing Provider  ascorbic acid (VITAMIN C) 500 MG tablet Take 1 tablet (500 mg total) by mouth daily. 06/08/20  Yes [name], MD  buPROPion ER [facility] SR) 100 MG 12 hr tablet Take 1 tablet (100 mg total) by mouth daily. 07/07/21  Yes [name], MD   Calcium Magnesium Zinc 333-133-5 MG TABS Take 1 tablet by mouth daily. 06/08/20  Yes [name], MD  cetirizine (ZYRTEC ALLERGY) 10 MG tablet Take 1 tablet (10 mg total) by mouth daily. 12/03/20  Yes Cook, Barnie Del, DO  Cholecalciferol (VITAMIN D) 50 MCG (2000 UT) CAPS Take 2 capsules (4,000 Units total) by mouth daily. 07/11/21  Yes [name], MD  cyanocobalamin (,VITAMIN B-12,) 1000 MCG/ML injection Inject 1 mL (1,000 mcg total) into the muscle every 30 (thirty) days. 10/10/21  Yes [name], MD  dexmethylphenidate (FOCALIN XR) 10 MG 24 hr capsule Take 1 capsule (10 mg total) by mouth daily. 12/08/21  Yes [name], MD  famotidine (PEPCID) 20 MG tablet Take 1 tablet (20 mg total) by mouth 2 (two) times daily. 12/03/20  Yes Cook, Jayce G, DO  hydrochlorothiazide (HYDRODIURIL) 12.5 MG tablet TAKE 1 TABLET BY MOUTH EVERY DAY 01/13/21  Yes [name], MD  hydrOXYzine (ATARAX) 10 MG tablet Take 1 tablet (10 mg total) by mouth daily as needed for itching (hives). 12/16/21  Yes Margarette Canada, NP  Imiquimod 3.75 % CREA Apply to affected area 2-3 times weekly for 6 weeks 02/14/21  Yes Woodroe Mode, MD  Iron-Vitamins (GERITOL PO) Take 1 tablet by mouth daily.   Yes [provider]  LORazepam (ATIVAN) 0.5 MG tablet TAKE 1 TABLET BY MOUTH TWICE A DAY AS NEEDED FOR ANXIETY Strength: 0.5 mg 11/29/21  Yes [name], MD  metoprolol succinate (TOPROL-XL) 50 MG 24 hr tablet TAKE 1 TABLET (50 MG TOTAL) BY MOUTH DAILY. TAKE WITH OR IMMEDIATELY FOLLOWING A MEAL. 01/13/21  Yes [name], MD  Multiple Vitamin (MULTIVITAMIN) tablet Take 1 tablet by mouth daily.   Yes [provider]  predniSONE (DELTASONE) 20 MG tablet 3 tabs daily x3 days, then 2 tabs daily x3 days, then 1 tab daily x3 days, then one half tab daily x3 days, then stop 12/20/21  Yes Akili Cuda, Gwenlyn Perking, MD  Syringe/Needle, Disp, (SYRINGE 3CC/25GX1") 25G X 1" 3 ML MISC Use with B12 injection once  monthly 01/27/21  Yes [name], MD  thiamine 100 MG tablet Take 1 tablet (100 mg total) by mouth daily. 11/03/19  Yes [name], MD  Turmeric 500 MG CAPS Take 1 capsule by mouth daily. 06/08/20  Yes [name], MD    Family History Family History  Problem Relation Age of Onset   Coronary artery disease Father        2 stents; + smoker   Hypertension Father  Thyroid disease Brother    Diabetes Maternal Grandmother    Cirrhosis Paternal Grandmother    Alcohol abuse Paternal Grandmother    Breast cancer Other        Paternal great aunt(obesity, smoker, EtOHic)   Breast cancer Other        Maternal great aunt (deceased in 38's from same)   Thyroid disease Other        cousins and grandmother   Cancer Other        great aunt - lung   Liver disease Paternal Aunt    Cancer Cousin        liver, bile duct   Cancer Mother        uterine   Uterine cancer Mother    Colon polyps Neg Hx    Colon cancer Neg Hx    Esophageal cancer Neg Hx    Rectal cancer Neg Hx    Stomach cancer Neg Hx     Social History Social History   Tobacco Use   Smoking status: Never   Smokeless tobacco: Never  Vaping Use   Vaping Use: Never used  Substance Use Topics   Alcohol use: Yes    Comment: occasional   Drug use: No     Allergies   Sertraline and Sulfa antibiotics   Review of Systems Review of Systems   Physical Exam Triage Vital Signs ED Triage Vitals  Enc Vitals Group     BP 12/20/21 1444 (!) 150/98     Pulse Rate 12/20/21 1444 72     Resp 12/20/21 1444 18     Temp 12/20/21 1444 98 F (36.7 C)     Temp Source 12/20/21 1444 Oral     SpO2 12/20/21 1444 98 %     Weight 12/20/21 1446 231 lb 0.7 oz (104.8 kg)     Height 12/20/21 1446 '5\' 5"'$  (1.651 m)     Head Circumference --      Peak Flow --      Pain Score 12/20/21 1446 0     Pain Loc --      Pain Edu? --      Excl. in [place]? --    No data found.  Updated Vital Signs BP (!) 150/98 (BP Location: Right  Arm)   Pulse 72   Temp 98 F (36.7 C) (Oral)   Resp 18   Ht '5\' 5"'$  (1.651 m)   Wt 104.8 kg   SpO2 98%   BMI 38.45 kg/m   Visual Acuity Right Eye Distance:   Left Eye Distance:   Bilateral Distance:    Right Eye Near:   Left Eye Near:    Bilateral Near:     Physical Exam Vitals reviewed.  Constitutional:      General: She is not in acute distress.    Appearance: She is not ill-appearing, toxic-appearing or diaphoretic.  HENT:     Nose: Nose normal.     Mouth/Throat:     Mouth: Mucous membranes are moist.     Pharynx: No oropharyngeal exudate or posterior oropharyngeal erythema.  Eyes:     Extraocular Movements: Extraocular movements intact.     Conjunctiva/sclera: Conjunctivae normal.     Pupils: Pupils are equal, round, and reactive to light.  Cardiovascular:     Rate and Rhythm: Normal rate and regular rhythm.     Heart sounds: No murmur heard. Pulmonary:     Effort: Pulmonary effort is normal.     Breath sounds: No  stridor. No wheezing, rhonchi or rales.  Musculoskeletal:     Cervical back: Neck supple.  Skin:    Comments: There are urticaria on her arms back and chest.  Neurological:     General: No focal deficit present.     Mental Status: She is alert and oriented to person, place, and time.  Psychiatric:        Behavior: Behavior normal.     UC Treatments / Results  Labs (all labs ordered are listed, but only abnormal results are displayed) Labs Reviewed - No data to display  EKG   Radiology No results found.  Procedures Procedures (including critical care time)  Medications Ordered in UC Medications - No data to display  Initial Impression / Assessment and Plan / UC Course  I have reviewed the triage vital signs and the nursing notes.  Pertinent labs & imaging results that were available during my care of the patient were reviewed by me and considered in my medical decision making (see chart for details).     She tried to get in with  her PCP to then reestablish with allergy today, but she could not miss work today.  She plans to reschedule with them.  We will do a longer prednisone taper.  She is continuing her hydroxyzine is already taking Zyrtec and does not Final Clinical Impressions(s) / UC Diagnoses   Final diagnoses:  Chronic urticaria   Discharge Instructions   None    ED Prescriptions     Medication Sig Dispense Auth. Provider   predniSONE (DELTASONE) 20 MG tablet 3 tabs daily x3 days, then 2 tabs daily x3 days, then 1 tab daily x3 days, then one half tab daily x3 days, then stop 20 tablet Elani Delph, Gwenlyn Perking, MD      PDMP not reviewed this encounter.   Barrett Henle, MD 12/20/21 [phone]

## 2021-12-20 NOTE — Discharge Instructions (Addendum)
Take prednisone 20 mg--3 tabs daily x3 days, then 2 tabs daily x3 days, then 1 tab daily x3 days, then one half tab daily x3 days, then stop  Continue your hydroxyzine, Zyrtec, and Pepcid the same.

## 2021-12-22 ENCOUNTER — Ambulatory Visit (INDEPENDENT_AMBULATORY_CARE_PROVIDER_SITE_OTHER): Payer: BC Managed Care – PPO | Admitting: Family

## 2021-12-22 ENCOUNTER — Encounter: Payer: Self-pay | Admitting: Family

## 2021-12-22 VITALS — BP 148/90 | HR 72 | Temp 98.0°F | Resp 16 | Ht 65.0 in | Wt 235.4 lb

## 2021-12-22 DIAGNOSIS — F902 Attention-deficit hyperactivity disorder, combined type: Secondary | ICD-10-CM | POA: Diagnosis not present

## 2021-12-22 DIAGNOSIS — L509 Urticaria, unspecified: Secondary | ICD-10-CM | POA: Diagnosis not present

## 2021-12-22 DIAGNOSIS — Z888 Allergy status to other drugs, medicaments and biological substances status: Secondary | ICD-10-CM | POA: Diagnosis not present

## 2021-12-22 HISTORY — DX: Allergy status to other drugs, medicaments and biological substances: Z88.8

## 2021-12-22 LAB — CBC WITH DIFFERENTIAL/PLATELET
Basophils Absolute: 0 10*3/uL (ref 0.0–0.1)
Basophils Relative: 0.1 % (ref 0.0–3.0)
Eosinophils Absolute: 0.1 10*3/uL (ref 0.0–0.7)
Eosinophils Relative: 0.3 % (ref 0.0–5.0)
HCT: 41.1 % (ref 36.0–46.0)
Hemoglobin: 13.3 g/dL (ref 12.0–15.0)
Lymphocytes Relative: 8.2 % — ABNORMAL LOW (ref 12.0–46.0)
Lymphs Abs: 1.4 10*3/uL (ref 0.7–4.0)
MCHC: 32.3 g/dL (ref 30.0–36.0)
MCV: 85.7 fl (ref 78.0–100.0)
Monocytes Absolute: 1.5 10*3/uL — ABNORMAL HIGH (ref 0.1–1.0)
Monocytes Relative: 8.6 % (ref 3.0–12.0)
Neutro Abs: 14.7 10*3/uL — ABNORMAL HIGH (ref 1.4–7.7)
Neutrophils Relative %: 82.8 % — ABNORMAL HIGH (ref 43.0–77.0)
Platelets: 493 10*3/uL — ABNORMAL HIGH (ref 150.0–400.0)
RBC: 4.8 Mil/uL (ref 3.87–5.11)
RDW: 14 % (ref 11.5–15.5)
WBC: 17.8 10*3/uL — ABNORMAL HIGH (ref 4.0–10.5)

## 2021-12-22 MED ORDER — EPINEPHRINE 0.3 MG/0.3ML IJ SOAJ: 0.3000 mg | INTRAMUSCULAR | 0 refills | Status: AC | PRN

## 2021-12-22 MED ORDER — METHYLPHENIDATE HCL ER (OSM) 18 MG PO TBCR: 18.0000 mg | EXTENDED_RELEASE_TABLET | Freq: Every day | ORAL | 0 refills | Status: DC

## 2021-12-22 MED ORDER — TRIAMCINOLONE ACETONIDE 0.1 % EX CREA: 1.0000 "application " | TOPICAL_CREAM | Freq: Two times a day (BID) | CUTANEOUS | 0 refills | Status: DC
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
## 2021-12-22 NOTE — Assessment & Plan Note (Signed)
Ordering labs for alpha gal, cbc with diff, food and allergy panel Pt to keep appt with allergist. rx triamcinolone cream 0.1% for itching Calamine ok for rash Zyrtec nightly  hydroxyxine prn Continue prednisone as prescribed by urgent care  Epi pen given to pt prn

## 2021-12-22 NOTE — Patient Instructions (Signed)
Stop by the lab prior to leaving today. I will notify you of your results once received.   Due to recent changes in healthcare laws, you may see results of your imaging and/or laboratory studies on MyChart before I have had a chance to review them.  I understand that in some cases there may be results that are confusing or concerning to you. Please understand that not all results are received at the same time and often I may need to interpret multiple results in order to provide you with the best plan of care or course of treatment. Therefore, I ask that you please give me 2 business days to thoroughly review all your results before contacting my office for clarification. Should we see a critical lab result, you will be contacted sooner.   It was a pleasure seeing you today! Please do not hesitate to reach out with any questions and or concerns.  Regards,   Kolten Ryback FNP-C  

## 2021-12-22 NOTE — Assessment & Plan Note (Signed)
Suspected allergy to focalin Added to allergy list Epi pen prescribed prn

## 2021-12-22 NOTE — Assessment & Plan Note (Signed)
Suspected allergy reaction to focalin, stop focalin rx sent in for concerta 18 mg as back in stock at pharmacy per pt  See if rash improves pdmp reviewed

## 2021-12-25 NOTE — Progress Notes (Signed)
Cbc is abnormal, however, likely in response to inflammatory reaction from medication reaction. F/u with Dr. Gustavus Messing in two weeks and have these labs repeated.   Has allergy calmed down with d/c of medication?  Food allergy panel negative Resp panel negative   Pending alpha gal

## 2021-12-26 ENCOUNTER — Telehealth: Payer: Self-pay | Admitting: Family Medicine

## 2021-12-26 LAB — FOOD ALLERGY PROFILE W/ REFLEXES
Allergen, Salmon, f41: <0.1 kU/L
Almonds: <0.1 kU/L
CLASS: 0
CLASS: 0
CLASS: 0
CLASS: 0
CLASS: 0
CLASS: 0
CLASS: 0
CLASS: 0
CLASS: 0
CLASS: 0
CLASS: 0
Cashew IgE: <0.1 kU/L
Class: 0
Class: 0
Class: 0
Class: 0
Egg White IgE: <0.1 kU/L
Fish Cod: <0.1 kU/L
Hazelnut: <0.1 kU/L
Milk IgE: <0.1 kU/L
Peanut IgE: <0.1 kU/L
Scallop IgE: <0.1 kU/L
Sesame Seed f10: <0.1 kU/L
Shrimp IgE: <0.1 kU/L
Soybean IgE: <0.1 kU/L
Tuna IgE: <0.1 kU/L
Walnut: <0.1 kU/L
Wheat IgE: <0.1 kU/L

## 2021-12-26 LAB — ALPHA-GAL PANEL
Allergen, Mutton, f88: <0.1 kU/L
Allergen, Pork, f26: <0.1 kU/L
Beef: <0.1 kU/L
CLASS: 0
CLASS: 0
Class: 0
GALACTOSE-ALPHA-1,3-GALACTOSE IGE*: <0.1 kU/L (ref <0.10)

## 2021-12-26 LAB — RESPIRATORY ALLERGY PROFILE REGION II ~~LOC~~

## 2021-12-26 LAB — INTERPRETATION:

## 2021-12-26 NOTE — Telephone Encounter (Signed)
Pt returning call regarding lab results would like a call back [phone]

## 2021-12-26 NOTE — Telephone Encounter (Signed)
Pt came out to pick up lab results  CMA provided to her

## 2021-12-27 ENCOUNTER — Encounter: Payer: Self-pay | Admitting: Family Medicine

## 2021-12-27 ENCOUNTER — Ambulatory Visit (INDEPENDENT_AMBULATORY_CARE_PROVIDER_SITE_OTHER): Payer: BC Managed Care – PPO | Admitting: Family Medicine

## 2021-12-27 VITALS — BP 134/82 | HR 100 | Temp 97.8°F | Ht 65.0 in | Wt 236.2 lb

## 2021-12-27 DIAGNOSIS — L501 Idiopathic urticaria: Secondary | ICD-10-CM

## 2021-12-27 DIAGNOSIS — E042 Nontoxic multinodular goiter: Secondary | ICD-10-CM | POA: Diagnosis not present

## 2021-12-27 DIAGNOSIS — E010 Iodine-deficiency related diffuse (endemic) goiter: Secondary | ICD-10-CM

## 2021-12-27 NOTE — Progress Notes (Signed)
Patient ID: Theresa Pratt, female    DOB: Jul 29, 1970, 51 y.o.   MRN: [id number]  This visit was conducted in person.  BP 134/82   Pulse 100   Temp 97.8 F (36.6 C) (Temporal)   Ht '5\' 5"'$  (1.651 m)   Wt 236 lb 4 oz (107.2 kg)   SpO2 96%   BMI 39.31 kg/m    CC: f/u rash Subjective:   HPI: Theresa Pratt is a 51 y.o. female presenting on 12/27/2021 for Rash (Here for 10 day f/u, per [place]. )   Seen earlier last [facility] at [facility] for respiratory infection, symptoms consisted of sore throat swollen glands and noted exacerbation of chronic hives at that time.  Diagnosed with viral URI and pharyngitis, recommended supportive care at that time. Seen in follow-up at urgent care twice earlier this month with acute worsening of hives and concern for lip swelling as well as throat irritation with ST at that time, treated with hydroxyzine and prednisone taper, prednisone dose needed to be increased due to recurrent hives during taper.   Saw Tabitha NP in office last week with recurrent hives, possible allergic reaction to Focalin which was DC'd.  Concerta was restarted as it seems to be back in stock at the pharmacy.  Treated with topical triamcinolone cream, EpiPen was also prescribed.   Underwent reassuring food allergy panel and respiratory panel as well as alpha gal negative.  CBC showed elevated white cells to 17.8 and elevated platelets to 493.  Hemoglobin was normal.   Notes ongoing globus sensation to throat as well as itchy hives but overall better. Last prednisone pill is today.   Previously chronic urticaria was controlled by very low dose 2.'5mg'$  hydroxyzine.   No associated abd pain, chest pain or dizziness/syncope, BP fluctuations.   Upcoming allergist appointment 02/2022. Considering allergy shots.      Relevant past medical, surgical, family and social history reviewed and updated as indicated. Interim medical history since our last visit  reviewed. Allergies and medications reviewed and updated. Outpatient Medications Prior to Visit  Medication Sig Dispense Refill   ascorbic acid (VITAMIN C) 500 MG tablet Take 1 tablet (500 mg total) by mouth daily.     buPROPion ER (WELLBUTRIN SR) 100 MG 12 hr tablet Take 1 tablet (100 mg total) by mouth daily. 30 tablet 6   Calcium Magnesium Zinc 333-133-5 MG TABS Take 1 tablet by mouth daily.     cetirizine (ZYRTEC ALLERGY) 10 MG tablet Take 1 tablet (10 mg total) by mouth daily. 90 tablet 1   Cholecalciferol (VITAMIN D) 50 MCG (2000 UT) CAPS Take 2 capsules (4,000 Units total) by mouth daily.     cyanocobalamin (,VITAMIN B-12,) 1000 MCG/ML injection Inject 1 mL (1,000 mcg total) into the muscle every 30 (thirty) days. 3 mL 2   EPINEPHrine 0.3 mg/0.3 mL IJ SOAJ injection Inject 0.3 mg into the muscle as needed for anaphylaxis. 1 each 0   famotidine (PEPCID) 20 MG tablet Take 1 tablet (20 mg total) by mouth 2 (two) times daily. 180 tablet 1   hydrochlorothiazide (HYDRODIURIL) 12.5 MG tablet TAKE 1 TABLET BY MOUTH EVERY DAY 90 tablet 1   hydrOXYzine (ATARAX) 10 MG tablet Take 1 tablet (10 mg total) by mouth daily as needed for itching (hives). 90 tablet 2   Imiquimod 3.75 % CREA Apply to affected area 2-3 times weekly for 6 weeks 7.5 g 0   Iron-Vitamins (GERITOL PO) Take 1 tablet  by mouth daily.     LORazepam (ATIVAN) 0.5 MG tablet TAKE 1 TABLET BY MOUTH TWICE A DAY AS NEEDED FOR ANXIETY Strength: 0.5 mg 30 tablet 0   methylphenidate (CONCERTA) 18 MG PO CR tablet Take 1 tablet (18 mg total) by mouth daily. 30 tablet 0   metoprolol succinate (TOPROL-XL) 50 MG 24 hr tablet TAKE 1 TABLET (50 MG TOTAL) BY MOUTH DAILY. TAKE WITH OR IMMEDIATELY FOLLOWING A MEAL. 90 tablet 1   Multiple Vitamin (MULTIVITAMIN) tablet Take 1 tablet by mouth daily.     predniSONE (DELTASONE) 20 MG tablet 3 tabs daily x3 days, then 2 tabs daily x3 days, then 1 tab daily x3 days, then one half tab daily x3 days, then stop 20  tablet 0   Syringe/Needle, Disp, (SYRINGE 3CC/25GX1") 25G X 1" 3 ML MISC Use with B12 injection once monthly 1 each 6   thiamine 100 MG tablet Take 1 tablet (100 mg total) by mouth daily.     triamcinolone cream (KENALOG) 0.1 % Apply 1 application  topically 2 (two) times daily. 30 g 0   Turmeric 500 MG CAPS Take 1 capsule by mouth daily.     No facility-administered medications prior to visit.     Per HPI unless specifically indicated in ROS section below Review of Systems  Objective:  BP 134/82   Pulse 100   Temp 97.8 F (36.6 C) (Temporal)   Ht '5\' 5"'$  (1.651 m)   Wt 236 lb 4 oz (107.2 kg)   SpO2 96%   BMI 39.31 kg/m   Wt Readings from Last 3 Encounters:  12/27/21 236 lb 4 oz (107.2 kg)  12/22/21 235 lb 6 oz (106.8 kg)  12/20/21 231 lb 0.7 oz (104.8 kg)      Physical Exam Vitals and nursing note reviewed.  Constitutional:      Appearance: Normal appearance. She is obese. She is not ill-appearing.  HENT:     Mouth/Throat:     Mouth: Mucous membranes are moist.     Pharynx: Oropharynx is clear. No oropharyngeal exudate or posterior oropharyngeal erythema.  Eyes:     Extraocular Movements: Extraocular movements intact.     Pupils: Pupils are equal, round, and reactive to light.  Neck:     Thyroid: Thyromegaly (R sided) present. No thyroid mass or thyroid tenderness.  Cardiovascular:     Rate and Rhythm: Regular rhythm. Tachycardia present.     Pulses: Normal pulses.     Heart sounds: Normal heart sounds. No murmur heard. Pulmonary:     Effort: Pulmonary effort is normal. No respiratory distress.     Breath sounds: Normal breath sounds. No wheezing, rhonchi or rales.  Musculoskeletal:     Right lower leg: No edema.     Left lower leg: No edema.  Skin:    General: Skin is warm and dry.     Findings: No rash.     Comments: No hives at this time   Neurological:     Mental Status: She is alert.  Psychiatric:        Mood and Affect: Mood normal.        Behavior:  Behavior normal.       Results for orders placed or performed in visit on 12/27/21  TSH  Result Value Ref Range   TSH 0.72 0.35 - 5.50 uIU/mL    Assessment & Plan:   Problem List Items Addressed This Visit     Nontoxic multinodular goiter  H/o this, last [place] 2015. Exam today with R thyromegaly and she notes globus sensation - will update TSH and thyroid US.       Chronic idiopathic urticaria - Primary    Currently stable without symptoms. However describes recent systemic symptoms besides just urticaria including lip swelling and throat tightness raising question of recurrent idiopathic angioedema/anaphylaxis. Has allergist f/u planned for August.  Recent evaluation reassuring - including alpha-gal, respiratory and food allergy panels. ?viral vs stress-induced urticaria. Symptoms worsened after starting Focalin (due to [employer]). Have added Focalin to allergy list.  Discussed continue daily zyrtec with PRN hydroxyzine which is effective, as well as need to keep epi pen on hand.       Other Visit Diagnoses     Thyromegaly       Relevant Orders   US THYROID   TSH (Completed)        No orders of the defined types were placed in this encounter.  Orders Placed This Encounter  Procedures   US THYROID    Standing Status:   Future    Standing Expiration Date:   12/28/2022    Order Specific Question:   Reason for Exam (SYMPTOM  OR DIAGNOSIS REQUIRED)    Answer:   R thyromegaly    Order Specific Question:   Preferred imaging location?    Answer:   [id number]   TSH    Patient Instructions  Will see if we can add thyroid test to blood in lab.  If not, we will draw thyroid test.  We will check thyroid ultrasound.  Keep allergist appointment in August.  Keep Epi pen on hand.  Keep hydroxyzine on hand.   Follow up plan: Return if symptoms worsen or fail to improve.  [name], MD

## 2021-12-27 NOTE — Telephone Encounter (Signed)
Spoke to pt about lab results and gave her a printout of them.

## 2021-12-27 NOTE — Patient Instructions (Addendum)
Will see if we can add thyroid test to blood in lab.  If not, we will draw thyroid test.  We will check thyroid ultrasound.  Keep allergist appointment in August.  Keep Epi pen on hand.  Keep hydroxyzine on hand.

## 2021-12-28 LAB — TSH: TSH: 0.72 u[IU]/mL (ref 0.35–5.50)

## 2021-12-28 NOTE — Assessment & Plan Note (Addendum)
Currently stable without symptoms. However describes recent systemic symptoms besides just urticaria including lip swelling and throat tightness raising question of recurrent idiopathic angioedema/anaphylaxis. Has allergist f/u planned for August.  Recent evaluation reassuring - including alpha-gal, respiratory and food allergy panels. ?viral vs stress-induced urticaria. Symptoms worsened after starting Focalin (due to [employer]). Have added Focalin to allergy list.  Discussed continue daily zyrtec with PRN hydroxyzine which is effective, as well as need to keep epi pen on hand.

## 2021-12-28 NOTE — Assessment & Plan Note (Addendum)
H/o this, last [place] 2015. Exam today with R thyromegaly and she notes globus sensation - will update TSH and thyroid US.

## 2022-01-05 ENCOUNTER — Encounter: Payer: Self-pay | Admitting: Emergency Medicine

## 2022-01-05 ENCOUNTER — Other Ambulatory Visit: Payer: Self-pay | Admitting: Family Medicine

## 2022-01-05 ENCOUNTER — Ambulatory Visit
Admission: EM | Admit: 2022-01-05 | Discharge: 2022-01-05 | Disposition: A | Discharge status: Home / Self Care | Payer: PRIVATE HEALTH INSURANCE | Attending: Physician Assistant | Admitting: Physician Assistant

## 2022-01-05 ENCOUNTER — Ambulatory Visit
Admission: RE | Admit: 2022-01-05 | Discharge: 2022-01-05 | Disposition: A | Discharge status: Home / Self Care | Payer: PRIVATE HEALTH INSURANCE | Source: Ambulatory Visit | Attending: Family Medicine | Admitting: Family Medicine

## 2022-01-05 DIAGNOSIS — L03011 Cellulitis of right finger: Secondary | ICD-10-CM

## 2022-01-05 DIAGNOSIS — Z1231 Encounter for screening mammogram for malignant neoplasm of breast: Secondary | ICD-10-CM

## 2022-01-05 DIAGNOSIS — E010 Iodine-deficiency related diffuse (endemic) goiter: Secondary | ICD-10-CM

## 2022-01-05 MED ORDER — CEPHALEXIN 500 MG PO CAPS: 500.0000 mg | ORAL_CAPSULE | Freq: Four times a day (QID) | ORAL | 0 refills | Status: AC

## 2022-01-09 ENCOUNTER — Ambulatory Visit: Payer: PRIVATE HEALTH INSURANCE | Admitting: Family Medicine

## 2022-01-18 ENCOUNTER — Other Ambulatory Visit: Payer: Self-pay | Admitting: Family Medicine

## 2022-01-18 DIAGNOSIS — L509 Urticaria, unspecified: Secondary | ICD-10-CM

## 2022-01-18 DIAGNOSIS — Z888 Allergy status to other drugs, medicaments and biological substances status: Secondary | ICD-10-CM

## 2022-01-18 MED ORDER — LORAZEPAM 0.5 MG PO TABS: ORAL_TABLET | ORAL | 0 refills | Status: DC

## 2022-01-18 MED ORDER — METHYLPHENIDATE HCL ER (OSM) 18 MG PO TBCR: 18.0000 mg | EXTENDED_RELEASE_TABLET | Freq: Every day | ORAL | 0 refills | Status: DC

## 2022-01-18 NOTE — Telephone Encounter (Addendum)
  Encourage patient to contact the pharmacy for refills or they can request refills through [facility]  Did the patient contact the pharmacy:  no   LAST APPOINTMENT DATE:  Please schedule appointment if longer than 1 year  NEXT APPOINTMENT DATE:06/27/2022  MEDICATION:methylphenidate (CONCERTA) 18 MG PO CR tablet, LORazepam (ATIVAN) 0.5 MG tablet  Is the patient out of medication? no  If not, how much is left? Until next week  Is this a 90 day supply: no  PHARMACY: [id number] [place] [place] [address]:  [phone] Fax:  [phone]     Let patient know to contact pharmacy at the end of the day to make sure medication is ready.  Please notify patient to allow 48-72 hours to process  CLINICAL FILLS OUT ALL BELOW:   LAST REFILL:  QTY:  REFILL DATE:    OTHER COMMENTS:    Okay for refill?  Please advise

## 2022-01-18 NOTE — Telephone Encounter (Signed)
ERx 

## 2022-01-18 NOTE — Telephone Encounter (Signed)
Name of Medication: Concerta, Lorazepam Name of Pharmacy: [facility] Last Fill or Written Date and Quantity:       Concerta:  3/0/13, #14      Lorazepm:  11/29/21, #30 Last Office Visit and Type: 12/27/21, 10 day rash f/u Next Office Visit and Type: 07/06/22, CPE Last Controlled Substance Agreement Date: 10/30/17 Last UDS: 10/30/17

## 2022-02-26 ENCOUNTER — Ambulatory Visit
Admission: RE | Admit: 2022-02-26 | Discharge: 2022-02-26 | Disposition: A | Discharge status: Home / Self Care | Payer: PRIVATE HEALTH INSURANCE | Source: Ambulatory Visit | Attending: Family Medicine | Admitting: Family Medicine

## 2022-02-26 DIAGNOSIS — Z1231 Encounter for screening mammogram for malignant neoplasm of breast: Secondary | ICD-10-CM

## 2022-03-02 ENCOUNTER — Telehealth: Payer: Self-pay | Admitting: Family Medicine

## 2022-03-02 DIAGNOSIS — Z888 Allergy status to other drugs, medicaments and biological substances status: Secondary | ICD-10-CM

## 2022-03-02 DIAGNOSIS — L509 Urticaria, unspecified: Secondary | ICD-10-CM

## 2022-03-02 MED ORDER — METHYLPHENIDATE HCL ER (OSM) 18 MG PO TBCR: 18.0000 mg | EXTENDED_RELEASE_TABLET | Freq: Every day | ORAL | 0 refills | Status: DC

## 2022-03-02 MED ORDER — LORAZEPAM 0.5 MG PO TABS: ORAL_TABLET | ORAL | 0 refills | Status: DC

## 2022-03-02 NOTE — Telephone Encounter (Signed)
Name of Medication: Lorazepam, Concerta Name of Pharmacy: [facility] Last Fill or Written Date and Quantity: 01/18/22      Lorazepam- #38      Concerta-# 30 Last Office Visit and Type: 12/27/21, rash f/u Next Office Visit and Type: 07/06/22, CPE Last Controlled Substance Agreement Date: 10/30/17 Last UDS: 10/30/17

## 2022-03-02 NOTE — Telephone Encounter (Signed)
ERx 

## 2022-03-02 NOTE — Addendum Note (Signed)
Addended by: [name] on: 03/02/2022 05:26 PM   Modules accepted: Orders

## 2022-03-02 NOTE — Telephone Encounter (Signed)
  Encourage patient to contact the pharmacy for refills or they can request refills through [facility]  Did the patient contact the pharmacy:  y   LAST APPOINTMENT DATE:  12/27/21  NEXT APPOINTMENT DATE:07/06/22  MEDICATION:methylphenidate (CONCERTA) 18 MG PO CR tablet  LORazepam (ATIVAN) 0.5 MG tablet    Is the patient out of medication? y  If not, how much is left?  Is this a 90 day supply:   [id number] [place] [place]  [phone] Fax:  [phone]     Let patient know to contact pharmacy at the end of the day to make sure medication is ready.  Please notify patient to allow 48-72 hours to process

## 2022-03-29 ENCOUNTER — Encounter: Payer: Self-pay | Admitting: Family

## 2022-03-29 ENCOUNTER — Other Ambulatory Visit: Payer: Self-pay

## 2022-03-29 ENCOUNTER — Ambulatory Visit: Payer: BC Managed Care – PPO | Admitting: Family

## 2022-03-29 VITALS — BP 130/86 | HR 97 | Temp 97.6°F | Ht 65.0 in | Wt 236.0 lb

## 2022-03-29 DIAGNOSIS — H66002 Acute suppurative otitis media without spontaneous rupture of ear drum, left ear: Secondary | ICD-10-CM | POA: Diagnosis not present

## 2022-03-29 DIAGNOSIS — L509 Urticaria, unspecified: Secondary | ICD-10-CM

## 2022-03-29 DIAGNOSIS — I10 Essential (primary) hypertension: Secondary | ICD-10-CM | POA: Diagnosis not present

## 2022-03-29 DIAGNOSIS — Z888 Allergy status to other drugs, medicaments and biological substances status: Secondary | ICD-10-CM

## 2022-03-29 DIAGNOSIS — R490 Dysphonia: Secondary | ICD-10-CM | POA: Diagnosis not present

## 2022-03-29 MED ORDER — AMOXICILLIN-POT CLAVULANATE 875-125 MG PO TABS: 1.0000 | ORAL_TABLET | Freq: Two times a day (BID) | ORAL | 0 refills | Status: DC

## 2022-03-29 MED ORDER — PREDNISONE 20 MG PO TABS: ORAL_TABLET | ORAL | 0 refills | Status: DC

## 2022-03-29 MED ORDER — HYDROCHLOROTHIAZIDE 12.5 MG PO TABS: 12.5000 mg | ORAL_TABLET | Freq: Every day | ORAL | 1 refills | Status: DC

## 2022-03-29 NOTE — Telephone Encounter (Signed)
Noted.  Name of Medication: Concerta, Lorazepam Name of Pharmacy: [facility] Last Fill or Written Date and Quantity: 02/14/22      Concerta- #36      Lorazepm- #30 Last Office Visit and Type: 12/27/21, 10 day rash f/u Next Office Visit and Type: 07/06/22, CPE Last Controlled Substance Agreement Date: 10/30/17 Last UDS: 10/30/17
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
## 2022-03-29 NOTE — Assessment & Plan Note (Signed)
rx augmentin 875/125 mg po bid x 10 days Take antibiotic as prescribed. Increase oral fluids. Pt to f/u if sx worsen and or fail to improve in 2-3 days.  

## 2022-03-29 NOTE — Telephone Encounter (Signed)
Patient was in office today for acute visit with Tabitha requested message be sent to Dr. Darnell Level for refill on pended medications. Let patient know that he is out of office today. Refills weill be addressed in 3 business days per office policy.

## 2022-03-29 NOTE — Addendum Note (Signed)
Addended by: Brenton Grills on: 6/85/4883 01:41 AM   Modules accepted: Orders

## 2022-03-29 NOTE — Assessment & Plan Note (Addendum)
Advised patient to start monitoring her blood pressure and create a blood pressure log and follow-up with Dr. Danise Mina in office in 2 weeks to go over blood pressure results with him.  Continue hydrochlorothiazide and metoprolol as prescribed.  Keep a low-sodium diet

## 2022-03-30 MED ORDER — LORAZEPAM 0.5 MG PO TABS: ORAL_TABLET | ORAL | 0 refills | Status: DC

## 2022-03-30 MED ORDER — METHYLPHENIDATE HCL ER (OSM) 18 MG PO TBCR: 18.0000 mg | EXTENDED_RELEASE_TABLET | Freq: Every day | ORAL | 0 refills | Status: DC

## 2022-03-30 NOTE — Telephone Encounter (Signed)
ERx 

## 2022-04-04 ENCOUNTER — Other Ambulatory Visit: Payer: Self-pay | Admitting: Family

## 2022-04-04 ENCOUNTER — Telehealth: Payer: Self-pay | Admitting: Family Medicine

## 2022-04-04 DIAGNOSIS — R490 Dysphonia: Secondary | ICD-10-CM

## 2022-04-04 MED ORDER — PREDNISONE 20 MG PO TABS: ORAL_TABLET | ORAL | 0 refills | Status: DC

## 2022-04-04 NOTE — Telephone Encounter (Signed)
Noted  

## 2022-04-04 NOTE — Telephone Encounter (Signed)
Spoke to patient by telephone and was advised that she just needs another script for the Prednisone. Patient stated that she is seeing a specialist and will be getting a real expensive shot at the end of the month. Patient stated that this is an ongoing problem with her hoarseness and Dr. Danise Mina. Is aware of this problem. Patient wants to know if Dr. Danise Mina will send her another script in for the Prednisone to help her with this problem until she can get started on the shots that she will be taking. Pharmacy [facility]. Patient stated that she has an appointment scheduled with Dr. Danise Mina in December. Patent stated she was told by the front office that is the first appointment that he has available. Patient stated that she tried to schedule a follow-up appointment and was told that was the first thing that he had. Patient was advised that  this is probably the first CPE he has available but can get her in sooner for her blood pressure.

## 2022-04-04 NOTE — Telephone Encounter (Signed)
Plz notifiy I've sent in prednisone taper to her pharmacy. Please schedule acute appointment with me if not improving. I have multiple appts for next week.

## 2022-04-04 NOTE — Telephone Encounter (Signed)
Patient called back in and stated that she is experiencing a horrible hoarseness. She stated that she needs a little more prednisone to help clear it up. She will like for someone to give her a call as soon as they can at [phone]. Thank you!

## 2022-04-04 NOTE — Telephone Encounter (Signed)
Patient called in stating that she was seen on 9/14 by tabitha. She was prescribed rxpredniSONE (DELTASONE) 20 MG tablet . She said that she is experiencing hives now and her voice has gotten worse. She would like to know if more of the Prednisone can be called in for her?

## 2022-04-04 NOTE — Telephone Encounter (Signed)
Patient had called earlier. See other  phone note.

## 2022-04-04 NOTE — Telephone Encounter (Signed)
If patient having hives she would need to be seen more acutely.  She did not have hives currently at the visit I know she did express that she gets these chronically however she also had Augmentin so I have to make sure it was not the medication causing this.  Also note, at the visit she was advised to follow-up with Dr. Danise Mina in 2 weeks from the visit for blood pressure follow-up but I do not see a follow-up appointment in place.

## 2022-04-04 NOTE — Telephone Encounter (Signed)
Patient notified by telephone and verbalized understanding. Patient stated that she is on another line trying to take care of getting her shot lines up. Patient stated that her blood pressure is still staying elevated. Patient stated that she will call back and schedule an appointment next week with Dr. Danise Mina to follow-up on her blood pressure when she finishes the phone call that she is on.

## 2022-04-04 NOTE — Addendum Note (Signed)
Addended by: [name] on: 04/04/2022 02:14 PM   Modules accepted: Orders

## 2022-04-14 ENCOUNTER — Ambulatory Visit
Admission: EM | Admit: 2022-04-14 | Discharge: 2022-04-14 | Disposition: A | Discharge status: Home / Self Care | Payer: BC Managed Care – PPO | Attending: Physician Assistant | Admitting: Physician Assistant

## 2022-04-14 DIAGNOSIS — R490 Dysphonia: Secondary | ICD-10-CM

## 2022-04-14 DIAGNOSIS — L508 Other urticaria: Secondary | ICD-10-CM | POA: Diagnosis not present

## 2022-04-14 DIAGNOSIS — R21 Rash and other nonspecific skin eruption: Secondary | ICD-10-CM | POA: Diagnosis not present

## 2022-04-14 MED ORDER — PREDNISONE 20 MG PO TABS
ORAL_TABLET | ORAL | 0 refills | Status: DC
Start: 2022-04-14 — End: 2022-05-25

## 2022-04-14 NOTE — ED Triage Notes (Signed)
Pt c/o chronic hives due to stress.   Pt states that she is in the process for getting the Prozair shot and is waiting for a treatment plan.  Pt states she has been having an allergic reaction and is having hives and throat swelling for a couple of months.   Pt states that her voice became hoarse in August and the swelling was present   Pt states that she had just finished a 10 day dose of amoxicillin '125mg'$  last week   Pt denies trouble breathing and SOB.

## 2022-04-14 NOTE — ED Provider Notes (Signed)
[facility]    CSN: [id number] Arrival date & time: 04/14/22  0855      History   Chief Complaint Chief Complaint  Patient presents with   Allergic Reaction   Oral Swelling    HPI Theresa Pratt is a 51 y.o. female presenting for evaluation of hives that have been intermittent for several months.  Also reports that over the past several days her voice has been very hoarse.  She thinks this could be related to stress. Patient has a history of chronic urticaria of unknown origin.  She states that she has had multiple skin tests and sees an allergy specialist. She is currently awaiting treatment with Xolair.  She says they have her medication and she is just waiting to get an appointment scheduled.  Currently she has been taking 4 tablets of Allegra during the day and hydroxyzine at bedtime. Patient reports she has taken prednisone in the past, back in June for 12 days and it helped her symptoms for 2 months.  Patient is denying facial swelling, difficulty swallowing, difficulty breathing or chest pain.    HPI  Past Medical History:  Diagnosis Date   ADHD, adult residual type    Allergic rhinitis    post gastric bypass   Allergy    Allergy status to other drugs, medicaments and biological substances 12/22/2021   Anxiety    Arthropathy    weight bearing (pre gastric bypass)   Chronic urticaria    Diarrhea, functional    DM (diabetes mellitus) ([place])    pre gastric bypass, pt states she has not been treated for it in years, her doctor doesn't discuss it with her anymore   GERD (gastroesophageal reflux disease)    post gastric bypass   Heart murmur    had it in the past   History of chicken pox    History of open reduction and internal fixation (ORIF) procedure 02/21/2017   HTN (hypertension)    Pre gastric bypass   HTN (hypertension)    post gastric bypass   IDA (iron deficiency anemia)    post gastric bypass (iron infusion by hematology-doesn't build stores  -Dr. 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   Oligomenorrhea    on Implanon (pre gastric bypass)   PONV (postoperative nausea and vomiting)    after 1st surgery   Postpartum depression    post gastric bypass   Pre-eclampsia    post gastric bypass   Restless legs     Patient Active Problem List   Diagnosis Date Noted   Non-recurrent acute suppurative otitis media of left ear without spontaneous rupture of tympanic membrane 03/29/2022   Immunization deficiency 06/08/2020   Epidermal cyst 04/26/2020   Thiamine deficiency 10/22/2019   Severe obesity (BMI 35.0-39.9) with comorbidity ([place]) 06/03/2018   Displaced fracture of lateral malleolus of left fibula, initial encounter for closed fracture 02/19/2017   Vitamin B12 deficiency 12/21/2016   Attention deficit hyperactivity disorder (ADHD), 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   Chronic idiopathic urticaria 03/05/2016   PTSD (post-traumatic stress disorder) 02/28/2016   Anxiety 06/29/2015   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   Menorrhagia 07/04/2012   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   BACK PAIN, LUMBAR 07/14/2010   Vitamin D deficiency 07/03/2010   ALLERGIC RHINITIS 07/03/2010   GERD 07/03/2010   Essential hypertension 06/22/2010    Past Surgical History:  Procedure Laterality Date   CHOLECYSTECTOMY  08/2010   GASTRIC  BYPASS  2003   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 Authorizing Provider  amoxicillin-clavulanate (AUGMENTIN) 875-125 MG tablet Take 1 tablet by mouth 2 (two) times daily. 03/29/22  Yes Dugal, Lawerance Bach, FNP  ascorbic acid (VITAMIN C) 500 MG tablet Take 1 tablet (500 mg total) by mouth daily. 06/08/20  Yes [name], MD  Calcium Magnesium Zinc 333-133-5 MG TABS Take 1 tablet by mouth daily. 06/08/20  Yes [name], MD  cetirizine (ZYRTEC ALLERGY) 10 MG tablet Take 1 tablet (10 mg total) by mouth daily. 12/03/20  Yes Cook, Barnie Del, DO  Cholecalciferol (VITAMIN D) 50 MCG (2000 UT) CAPS Take 2 capsules (4,000 Units total) by mouth daily. 07/11/21  Yes [name], MD  cyanocobalamin (,VITAMIN B-12,) 1000 MCG/ML injection Inject 1 mL (1,000 mcg total) into the muscle every 30 (thirty) days. 10/10/21  Yes [name], MD  famotidine (PEPCID) 20 MG tablet Take 1 tablet (20 mg total) by mouth 2 (two) times daily. 12/03/20  Yes Cook, Jayce G, DO  hydrochlorothiazide (HYDRODIURIL) 12.5 MG tablet Take 1 tablet (12.5 mg total) by mouth daily. 03/29/22  Yes Eugenia Pancoast, FNP  hydrOXYzine (ATARAX) 10 MG tablet Take 1 tablet (10 mg total) by mouth daily as needed for itching (hives). 12/16/21  Yes Margarette Canada, NP  Imiquimod 3.75 % CREA Apply to affected area 2-3 times weekly for 6 weeks 02/14/21  Yes Woodroe Mode, MD  Iron-Vitamins (GERITOL PO) Take 1 tablet by mouth daily.   Yes [provider]  LORazepam (ATIVAN) 0.5 MG tablet TAKE 1 TABLET BY MOUTH TWICE A DAY AS NEEDED FOR ANXIETY Strength: 0.5 mg 03/30/22  Yes [name], MD  methylphenidate (CONCERTA) 18 MG PO CR tablet Take 1 tablet (18 mg total) by mouth daily. 03/30/22  Yes [name], MD  metoprolol succinate (TOPROL-XL) 50 MG 24 hr tablet TAKE 1 TABLET (50 MG TOTAL) BY MOUTH DAILY. TAKE WITH OR IMMEDIATELY FOLLOWING A MEAL. 01/13/21  Yes [name], MD  Multiple Vitamin (MULTIVITAMIN)  tablet Take 1 tablet by mouth daily.   Yes [provider]  predniSONE (DELTASONE) 20 MG tablet Take PO 3 tabs daily x3 days, then 2 tabs daily x3 days, then 1 tab daily x3 days, then one half tab daily x3 days, then stop 04/14/22  Yes Laurene Footman B, PA-C  Syringe/Needle, Disp, (SYRINGE 3CC/25GX1") 25G X 1" 3 ML MISC Use with B12 injection once monthly 01/27/21  Yes [name], MD  thiamine 100 MG tablet Take 1 tablet (100 mg total) by mouth daily. 11/03/19  Yes [name], MD  triamcinolone  cream (KENALOG) 0.1 % Apply 1 application  topically 2 (two) times daily. 12/22/21  Yes Dugal, Lawerance Bach, FNP  Turmeric 500 MG CAPS Take 1 capsule by mouth daily. 06/08/20  Yes [name], MD  XOLAIR 150 MG/ML prefilled syringe Inject into the skin. 03/22/22  Yes [provider]    Family History Family History  Problem Relation Age of Onset   Coronary artery disease Father        2 stents; + smoker   Hypertension Father    Thyroid disease Brother    Diabetes Maternal Grandmother    Cirrhosis Paternal Grandmother    Alcohol abuse Paternal Grandmother    Breast cancer Other        Paternal great aunt(obesity, smoker, EtOHic)   Breast cancer Other        Maternal great aunt (deceased in 12's from same)   Thyroid disease Other        cousins and grandmother   Cancer Other        great aunt - lung   Liver disease Paternal Aunt    Cancer Cousin        liver, bile duct   Cancer Mother        uterine   Uterine cancer Mother    Colon polyps Neg Hx    Colon cancer Neg Hx    Esophageal cancer Neg Hx    Rectal cancer Neg Hx    Stomach cancer Neg Hx     Social History Social History   Tobacco Use   Smoking status: Never   Smokeless tobacco: Never  Vaping Use   Vaping Use: Never used  Substance Use Topics   Alcohol use: Yes    Comment: occasional   Drug use: No     Allergies   Focalin [dexmethylphenidate], Sertraline, and Sulfa antibiotics   Review of  Systems Review of Systems  Constitutional:  Negative for chills, diaphoresis, fatigue and fever.  HENT:  Positive for voice change. Negative for congestion, facial swelling, sore throat and trouble swallowing.   Respiratory:  Negative for cough, chest tightness, shortness of breath and wheezing.   Cardiovascular:  Negative for chest pain.  Gastrointestinal:  Negative for abdominal pain, nausea and vomiting.  Skin:  Positive for rash.  Neurological:  Negative for weakness and headaches.     Physical Exam Triage Vital Signs ED Triage Vitals  Enc Vitals Group     BP --      Pulse --      Resp --      Temp --      Temp src --      SpO2 --      Weight 04/14/22 0938 246 lb (111.6 kg)     Height 04/14/22 0938 '5\' 5"'$  (1.651 m)     Head Circumference --      Peak Flow --      Pain Score 04/14/22 0934 4     Pain Loc --      Pain Edu? --      Excl. in [place]? --    No data found.  Updated Vital Signs BP (!) 136/94 (BP Location: Left Arm)   Pulse 82   Temp 97.9 F (36.6 C) (Oral)   Resp 18   Ht '5\' 5"'$  (1.651 m)   Wt 246 lb (111.6 kg)   SpO2 97%   BMI 40.94 kg/m    Physical Exam Vitals and nursing note reviewed.  Constitutional:  General: She is not in acute distress.    Appearance: Normal appearance. She is not ill-appearing or toxic-appearing.     Comments: +voice hoarseness  HENT:     Head: Normocephalic and atraumatic.     Nose: Nose normal.     Mouth/Throat:     Mouth: Mucous membranes are moist.     Pharynx: Oropharynx is clear. Posterior oropharyngeal erythema (mild) present.  Eyes:     General: No scleral icterus.       Right eye: No discharge.        Left eye: No discharge.     Conjunctiva/sclera: Conjunctivae normal.  Cardiovascular:     Rate and Rhythm: Normal rate and regular rhythm.     Heart sounds: Normal heart sounds.  Pulmonary:     Effort: Pulmonary effort is normal. No respiratory distress.     Breath sounds: Normal breath sounds.   Musculoskeletal:     Cervical back: Neck supple.  Skin:    General: Skin is dry.     Findings: Rash present.     Comments: Erythematous wheals of chest and arms with excoriations  Neurological:     General: No focal deficit present.     Mental Status: She is alert. Mental status is at baseline.     Motor: No weakness.     Gait: Gait normal.  Psychiatric:        Mood and Affect: Mood normal.        Behavior: Behavior normal.        Thought Content: Thought content normal.      UC Treatments / Results  Labs (all labs ordered are listed, but only abnormal results are displayed) Labs Reviewed - No data to display  EKG   Radiology No results found.  Procedures Procedures (including critical care time)  Medications Ordered in UC Medications - No data to display  Initial Impression / Assessment and Plan / UC Course  I have reviewed the triage vital signs and the nursing notes.  Pertinent labs & imaging results that were available during my care of the patient were reviewed by me and considered in my medical decision making (see chart for details).   51 year old female with history of chronic urticaria of unknown origin presents for having hives and voice hoarseness that is worsening over the past couple days.  She has been taking antihistamines.  Follows up with the allergy and asthma center.  Patient is awaiting treatment with Xolair.  Vitals are all stable.  She is overall well-appearing and in no acute distress.  Her voice is hoarse on examination.  Her throat is mildly erythematous with petechia.  She has erythematous wheals on her shoulders and chest with excoriations present.  Chest is clear to auscultation heart regular rate and rhythm.  Patient reports some improvement in relief of symptoms with prednisone taper in the past.  I reviewed patient's previous notes.  Reviewed her notes from the allergy and asthma center and her PCP.  Will prescribe the prednisone taper as  she has been prescribed in the past and have her continue antihistamines and contact the allergy and asthma center tomorrow to try to get her Xolair injection as soon as possible.  ED precautions given.   Final Clinical Impressions(s) / UC Diagnoses   Final diagnoses:  Voice hoarseness  Rash  Chronic urticaria     Discharge Instructions      -Continue with your antihistamines and following up with the allergy and asthma center. -  I sent the prednisone as you were prescribed in June.  I hope this helps and hopefully can get your Xolair injection as soon as possible.     ED Prescriptions     Medication Sig Dispense Auth. Provider   predniSONE (DELTASONE) 20 MG tablet Take PO 3 tabs daily x3 days, then 2 tabs daily x3 days, then 1 tab daily x3 days, then one half tab daily x3 days, then stop 20 tablet Danton Clap, PA-C      PDMP not reviewed this encounter.   Danton Clap, PA-C 04/14/22 1026

## 2022-04-14 NOTE — Discharge Instructions (Addendum)
-  Continue with your antihistamines and following up with the allergy and asthma center. - I sent the prednisone as you were prescribed in June.  I hope this helps and hopefully can get your Xolair injection as soon as possible.

## 2022-04-21 ENCOUNTER — Ambulatory Visit
Admission: EM | Admit: 2022-04-21 | Discharge: 2022-04-21 | Disposition: A | Discharge status: Home / Self Care | Payer: BC Managed Care – PPO | Attending: Emergency Medicine | Admitting: Emergency Medicine

## 2022-04-21 ENCOUNTER — Encounter: Payer: Self-pay | Admitting: Emergency Medicine

## 2022-04-21 DIAGNOSIS — I10 Essential (primary) hypertension: Secondary | ICD-10-CM | POA: Diagnosis not present

## 2022-04-21 DIAGNOSIS — L501 Idiopathic urticaria: Secondary | ICD-10-CM | POA: Diagnosis not present

## 2022-04-21 MED ORDER — METOPROLOL SUCCINATE ER 50 MG PO TB24: 50.0000 mg | ORAL_TABLET | Freq: Every day | ORAL | 0 refills | Status: DC

## 2022-04-21 MED ORDER — TRIAMCINOLONE ACETONIDE 0.025 % EX OINT: 1.0000 | TOPICAL_OINTMENT | Freq: Two times a day (BID) | CUTANEOUS | 0 refills | Status: DC

## 2022-04-21 NOTE — ED Provider Notes (Signed)
[facility]    CSN: [id number] Arrival date & time: 04/21/22  3846      History   Chief Complaint Chief Complaint  Patient presents with   Hypertension    HPI Theresa Pratt is a 51 y.o. female.   HPI  50 year old female here for evaluation of blood pressure.  Patient reports that she misplaced her metoprolol for the last week and that her blood pressure has been elevated.  She is also been dealing with a lot of work and family stressors that are contributing to her elevated blood pressure.  Additionally, she was recently diagnosed with chronic idiopathic urticaria and is now taking Xolair injections.  She received her first Xolair injection earlier in the week.  She is also taking Allegra 180 mg twice daily and Pepcid 20 mg twice daily.  She is concerned because she still has hives.  She denies any shortness of breath or wheezing.  She also denies chest pain.  She has had some intermittent headaches.  Past Medical History:  Diagnosis Date   ADHD, adult residual type    Allergic rhinitis    post gastric bypass   Allergy    Allergy status to other drugs, medicaments and biological substances 12/22/2021   Anxiety    Arthropathy    weight bearing (pre gastric bypass)   Chronic urticaria    Diarrhea, functional    DM (diabetes mellitus) ([place])    pre gastric bypass, pt states she has not been treated for it in years, her doctor doesn't discuss it with her anymore   GERD (gastroesophageal reflux disease)    post gastric bypass   Heart murmur    had it in the past   History of chicken pox    History of open reduction and internal fixation (ORIF) procedure 02/21/2017   HTN (hypertension)    Pre gastric bypass   HTN (hypertension)    post gastric bypass   IDA (iron deficiency anemia)    post gastric bypass (iron infusion by hematology-doesn't build stores -Dr. 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    Oligomenorrhea    on Implanon (pre gastric bypass)   PONV (postoperative nausea and vomiting)    after 1st surgery   Postpartum depression    post gastric bypass   Pre-eclampsia    post gastric bypass   Restless legs     Patient Active Problem List   Diagnosis Date Noted   Non-recurrent acute suppurative otitis media of left ear without spontaneous rupture of tympanic membrane 03/29/2022   Immunization deficiency 06/08/2020   Epidermal cyst 04/26/2020   Thiamine deficiency 10/22/2019   Severe obesity (BMI 35.0-39.9) with comorbidity ([place]) 06/03/2018   Displaced fracture of lateral malleolus of left fibula, initial encounter for closed fracture 02/19/2017   Vitamin B12 deficiency 12/21/2016   Attention deficit hyperactivity disorder (ADHD), 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   Chronic idiopathic urticaria 03/05/2016   PTSD (post-traumatic stress disorder) 02/28/2016   Anxiety 06/29/2015   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   Menorrhagia 07/04/2012   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   BACK PAIN, LUMBAR 07/14/2010   Vitamin D deficiency 07/03/2010   ALLERGIC RHINITIS 07/03/2010   GERD 07/03/2010   Essential hypertension 06/22/2010    Past Surgical History:  Procedure Laterality Date   CHOLECYSTECTOMY  08/2010   GASTRIC BYPASS  2003   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 Authorizing Provider  triamcinolone  (KENALOG) 0.025 % ointment Apply 1 Application topically 2 (two) times daily. 04/21/22  Yes Margarette Canada, NP  amoxicillin-clavulanate (AUGMENTIN) 875-125 MG tablet Take 1 tablet by mouth 2 (two) times daily. 03/29/22   Eugenia Pancoast, FNP  ascorbic acid (VITAMIN C) 500 MG tablet Take 1 tablet (500 mg total) by mouth daily. 06/08/20   [name], MD  Calcium Magnesium Zinc 333-133-5 MG TABS Take 1 tablet by mouth daily. 06/08/20   [name], MD  cetirizine (ZYRTEC ALLERGY) 10 MG tablet Take 1 tablet (10 mg total) by mouth daily. 12/03/20   Coral Spikes, DO  Cholecalciferol (VITAMIN D) 50 MCG (2000 UT) CAPS Take 2 capsules (4,000 Units total) by mouth daily. 07/11/21   [name], MD  cyanocobalamin (,VITAMIN B-12,) 1000 MCG/ML injection Inject 1 mL (1,000 mcg total) into the muscle every 30 (thirty) days. 10/10/21   [name], MD  famotidine (PEPCID) 20 MG tablet Take 1 tablet (20 mg total) by mouth 2 (two) times daily. 12/03/20   Coral Spikes, DO  hydrochlorothiazide (HYDRODIURIL) 12.5 MG tablet Take 1 tablet (12.5 mg total) by mouth daily. 03/29/22   Eugenia Pancoast, FNP  hydrOXYzine (ATARAX) 10 MG tablet Take 1 tablet (10 mg total) by mouth daily as needed for itching (hives). 12/16/21   Margarette Canada, NP  Imiquimod 3.75 % CREA Apply to affected area 2-3 times weekly for 6 weeks 02/14/21   Woodroe Mode, MD  Iron-Vitamins (GERITOL PO) Take 1 tablet by mouth daily.    [provider]  LORazepam (ATIVAN) 0.5 MG tablet TAKE 1 TABLET BY MOUTH TWICE A DAY AS NEEDED FOR ANXIETY Strength: 0.5 mg 03/30/22   [name], MD  methylphenidate (CONCERTA) 18 MG PO CR tablet Take 1 tablet (18 mg total) by mouth daily. 03/30/22   [name], MD  metoprolol succinate (TOPROL-XL) 50 MG 24 hr tablet Take 1 tablet (50 mg total) by mouth daily. Take with or immediately following a meal. 04/21/22   Margarette Canada, NP  Multiple Vitamin (MULTIVITAMIN) tablet Take 1 tablet by mouth  daily.    [provider]  predniSONE (DELTASONE) 20 MG tablet Take PO 3 tabs daily x3 days, then 2 tabs daily x3 days, then 1 tab daily x3 days, then one half tab daily x3 days, then stop 04/14/22   Laurene Footman B, PA-C  Syringe/Needle, Disp, (SYRINGE 3CC/25GX1") 25G X 1" 3 ML MISC Use with B12 injection once monthly 01/27/21   [name], MD  thiamine 100 MG tablet Take 1 tablet (100 mg total) by mouth daily. 11/03/19   [name], MD  Turmeric 500 MG CAPS Take 1 capsule by mouth daily. 06/08/20   [name], MD  Arvid Right 150 MG/ML prefilled syringe Inject into the skin. 03/22/22   [provider]    Family History Family History  Problem Relation Age of Onset   Coronary artery disease Father        2 stents; + smoker   Hypertension Father    Thyroid disease Brother    Diabetes Maternal Grandmother    Cirrhosis Paternal Grandmother    Alcohol abuse Paternal Grandmother    Breast cancer Other        Paternal great aunt(obesity, smoker, EtOHic)   Breast cancer Other        Maternal great aunt (deceased in 25's from same)   Thyroid disease Other        cousins and grandmother   Cancer Other        great aunt - lung   Liver disease Paternal Aunt    Cancer Cousin        liver, bile duct   Cancer Mother        uterine   Uterine cancer Mother    Colon polyps Neg Hx    Colon cancer Neg Hx    Esophageal cancer Neg Hx    Rectal cancer Neg Hx    Stomach cancer Neg Hx     Social History Social History   Tobacco Use   Smoking status: Never   Smokeless tobacco: Never  Vaping Use   Vaping Use: Never used  Substance Use Topics   Alcohol use: Yes    Comment: occasional   Drug use: No     Allergies   Focalin [dexmethylphenidate], Sertraline, and Sulfa antibiotics   Review of Systems Review of Systems  HENT:  Negative for facial swelling.   Respiratory:  Negative for shortness of breath.   Cardiovascular:  Negative for chest pain and  palpitations.  Skin:  Positive for rash.  Neurological:  Positive for headaches. Negative for dizziness.     Physical Exam Triage Vital Signs ED Triage Vitals [04/21/22 0819]  Enc Vitals Group     BP (!) 138/94     Pulse Rate 80     Resp 18     Temp 97.7 F (36.5 C)     Temp Source Oral     SpO2 97 %     Weight      Height      Head Circumference      Peak Flow      Pain Score 0     Pain Loc      Pain Edu?      Excl. in [place]?    No data found.  Updated Vital Signs BP (!) 138/94 (BP Location: Right Arm)   Pulse 80   Temp 97.7 F (36.5 C) (Oral)   Resp 18   SpO2 97%   Visual Acuity Right Eye Distance:   Left Eye Distance:   Bilateral Distance:    Right Eye Near:   Left Eye Near:    Bilateral Near:     Physical Exam Vitals and nursing note reviewed.  Constitutional:      Appearance: Normal appearance. She is not ill-appearing.  HENT:     Head: Normocephalic and atraumatic.  Cardiovascular:     Rate and Rhythm: Normal rate and regular rhythm.     Pulses: Normal pulses.     Heart sounds: Normal heart sounds. No murmur heard.  No friction rub. No gallop.  Pulmonary:     Effort: Pulmonary effort is normal.     Breath sounds: Normal breath sounds. No wheezing, rhonchi or rales.  Skin:    General: Skin is warm and dry.     Capillary Refill: Capillary refill takes less than 2 seconds.     Findings: Rash present.     Comments: Patient has scattered urticarial lesions on her left neck and shoulder.  She also negates that she has some on her abdomen.  Neurological:     General: No focal deficit present.     Mental Status: She is alert and oriented to person, place, and time.  Psychiatric:        Mood and Affect: Mood normal.        Behavior: Behavior normal.        Thought Content: Thought content normal.        Judgment: Judgment normal.      UC Treatments / Results  Labs (all labs ordered are listed, but only abnormal results are displayed) Labs  Reviewed - No data to display  EKG Normal sinus rhythm with a ventricular rate of 82 bpm Peer interval 126 ms QRS duration 78 ms QT/QTc 394/448 ms No ST or T wave abnormalities appreciated. No change when compared to EKG from 02/21/2017.   Radiology No results found.  Procedures Procedures (including critical care time)  Medications Ordered in UC Medications - No data to display  Initial Impression / Assessment and Plan / UC Course  I have reviewed the triage vital signs and the nursing notes.  Pertinent labs & imaging results that were available during my care of the patient were reviewed by me and considered in my medical decision making (see chart for details).   Patient is a pleasant, nontoxic-appearing 51 year old female here requesting a refill of metoprolol as she misplaced her bottle and has not had it for the past week.  Her blood pressure has been elevated and she has multiple work and home stressors that are contributing to this elevation.  She is also prescribed hydrochlorothiazide which she is also taking.  She denies any chest pain or shortness of breath.  She has had some intermittent headaches but no dizziness.  Additionally, she was recently diagnosed with idiopathic urticaria and started Xolair 300 mg every 4 weeks.  She is supposed to be taking Allegra 180 mg 2 pills twice daily but she states she is only taking 1 pill twice daily.  She is also taking Pepcid 20 mg twice daily for itching.  She has stopped hydroxyzine and Benadryl at the request of the allergist.  Patient is in no acute distress and her blood pressure is nominal here at 138/94.  I will check an EKG just to look for any signs of left-sided heart strain and refill the patient's metoprolol.  She is still itching from the idiopathic urticaria and I am reluctant to give her steroids given that she has a history of hypertension.  I have encouraged her to continue taking the Allegra and Pepcid as prescribed.  I will  prescribe a topical steroid that she can apply twice daily as needed for itching.  Patient's EKG shows normal sinus rhythm without any T wave or ST abnormalities.  No change when compared to EKG in epic from 02/21/2017.  I will refill the patient's metoprolol and discharge her home to follow-up with her primary care provider for more refills and continued medication management.  I will  also prescribe 0.025% triamcinolone that she can apply to her urticaria as needed for itching.  Patient should keep her follow-up with her allergy specialist at [facility] as scheduled.   Final Clinical Impressions(s) / UC Diagnoses   Final diagnoses:  Primary hypertension  Chronic idiopathic urticaria     Discharge Instructions      Your EKG was very reassuring and showed no signs of heart strain.  I have represcribed your metoprolol and I want you to start taking that daily for control of your blood pressure.  You to make an appoint with your primary care provider to have your blood pressure checked in 2 to 3 weeks.  They can also provide refills of this medication.  For your hives (urticaria) I am going to prescribe a topical steroid called triamcinolone.  You have been on this before.  It is a very low strength but you can apply it twice daily as needed for itching until the Xolair and your antihistamines take effect.  I encourage you to take your Allegra, 2 tablets twice daily, as recommended by your allergist.  Continue taking your Pepcid.  Return for reevaluation for any new or worsening symptoms.     ED Prescriptions     Medication Sig Dispense Auth. Provider   metoprolol succinate (TOPROL-XL) 50 MG 24 hr tablet Take 1 tablet (50 mg total) by mouth daily. Take with or immediately following a meal. 90 tablet Margarette Canada, NP   triamcinolone (KENALOG) 0.025 % ointment Apply 1 Application topically 2 (two) times daily. 30 g Margarette Canada, NP      PDMP not reviewed this encounter.   Margarette Canada, NP 04/21/22 [phone]

## 2022-04-21 NOTE — Discharge Instructions (Signed)
Your EKG was very reassuring and showed no signs of heart strain.  I have represcribed your metoprolol and I want you to start taking that daily for control of your blood pressure.  You to make an appoint with your primary care provider to have your blood pressure checked in 2 to 3 weeks.  They can also provide refills of this medication.  For your hives (urticaria) I am going to prescribe a topical steroid called triamcinolone.  You have been on this before.  It is a very low strength but you can apply it twice daily as needed for itching until the Xolair and your antihistamines take effect.  I encourage you to take your Allegra, 2 tablets twice daily, as recommended by your allergist.  Continue taking your Pepcid.  Return for reevaluation for any new or worsening symptoms.

## 2022-04-21 NOTE — ED Triage Notes (Signed)
Pt c/o high blood pressure due to stress. She states she recently got her Xolair injection. She states she lost her Metoprolol bottle for about a week now and only has HCTZ right now. Pt states she is having intermittent headaches.

## 2022-05-04 ENCOUNTER — Encounter: Payer: Self-pay | Admitting: Family Medicine

## 2022-05-04 ENCOUNTER — Ambulatory Visit: Payer: BC Managed Care – PPO | Admitting: Family Medicine

## 2022-05-04 VITALS — BP 134/88 | HR 86 | Temp 97.8°F | Ht 65.0 in | Wt 235.2 lb

## 2022-05-04 DIAGNOSIS — I10 Essential (primary) hypertension: Secondary | ICD-10-CM | POA: Diagnosis not present

## 2022-05-04 DIAGNOSIS — F419 Anxiety disorder, unspecified: Secondary | ICD-10-CM

## 2022-05-04 DIAGNOSIS — Z23 Encounter for immunization: Secondary | ICD-10-CM | POA: Diagnosis not present

## 2022-05-04 DIAGNOSIS — J309 Allergic rhinitis, unspecified: Secondary | ICD-10-CM | POA: Diagnosis not present

## 2022-05-04 DIAGNOSIS — L501 Idiopathic urticaria: Secondary | ICD-10-CM

## 2022-05-04 DIAGNOSIS — F902 Attention-deficit hyperactivity disorder, combined type: Secondary | ICD-10-CM

## 2022-05-04 MED ORDER — MONTELUKAST SODIUM 10 MG PO TABS: 10.0000 mg | ORAL_TABLET | Freq: Every day | ORAL | 11 refills | Status: DC

## 2022-05-04 MED ORDER — HYDROCHLOROTHIAZIDE 12.5 MG PO TABS: 12.5000 mg | ORAL_TABLET | Freq: Every day | ORAL | 2 refills | Status: DC

## 2022-05-04 MED ORDER — METOPROLOL SUCCINATE ER 50 MG PO TB24: 50.0000 mg | ORAL_TABLET | Freq: Every day | ORAL | 2 refills | Status: DC

## 2022-05-04 MED ORDER — FEXOFENADINE HCL 180 MG PO TABS: 180.0000 mg | ORAL_TABLET | Freq: Two times a day (BID) | ORAL | Status: DC

## 2022-05-04 MED ORDER — LORAZEPAM 0.5 MG PO TABS: ORAL_TABLET | ORAL | 0 refills | Status: DC

## 2022-05-04 MED ORDER — METHYLPHENIDATE HCL ER (OSM) 18 MG PO TBCR: 18.0000 mg | EXTENDED_RELEASE_TABLET | Freq: Every day | ORAL | 0 refills | Status: DC

## 2022-05-04 NOTE — Assessment & Plan Note (Signed)
Continue allegra BID. singulair refilled per pt request.

## 2022-05-04 NOTE — Assessment & Plan Note (Signed)
Established with allergist in [facility], receiving monthly Xolair injections with significant benefit. Also continues H1 and H2 antihistamines. singulair refilled.

## 2022-05-04 NOTE — Assessment & Plan Note (Signed)
Chronic, stable on current regimen - continue. 

## 2022-05-04 NOTE — Assessment & Plan Note (Signed)
Concerta refilled.

## 2022-05-04 NOTE — Progress Notes (Signed)
Patient ID: Theresa Pratt, female    DOB: 15-Nov-1970, 51 y.o.   MRN: [id number]  This visit was conducted in person.  BP 134/88   Pulse 86   Temp 97.8 F (36.6 C) (Temporal)   Ht '5\' 5"'$  (1.651 m)   Wt 235 lb 4 oz (106.7 kg)   SpO2 99%   BMI 39.15 kg/m   BP Readings from Last 3 Encounters:  05/04/22 134/88  04/21/22 (!) 138/94  04/14/22 (!) 136/94    CC: ER f/u visit  Subjective:   HPI: Theresa Pratt is a 51 y.o. female presenting on 05/04/2022 for Hospitalization Follow-up (Seen on 04/21/22 at [facility], dx HTN; chronic idiopathic urticaria. )   Recent ER visit 04/21/2022 for hypertension in setting of misplacing metoprolol bottle. Also has had increased family and work stressors. BP up to 180/100s (especially after xolair injection). No HA, vision changes, CP/tightness, SOB, leg swelling.   Has been taking online classes but hasn't been doing well due to medical issues Theresa Pratt was having in August. May need letter to drop class for medical reasons.   Current antihypertensive regimen is hctz 12.'5mg'$  daily, toprol XL '50mg'$  daily.  Also takes concerta '18mg'$  daily for ADHD.   Chronic idiopathic urticaria - first  Xolair injection 04/18/2022 monthly, also takes allegra '180mg'$  twice daily, and pepcid '20mg'$  once daily. Occasional zyrtec at night PRN. Not taking singulair '10mg'$  nightly - requests refill. Sees allergist in [facility].      Relevant past medical, surgical, family and social history reviewed and updated as indicated. Interim medical history since our last visit reviewed. Allergies and medications reviewed and updated. Outpatient Medications Prior to Visit  Medication Sig Dispense Refill   ascorbic acid (VITAMIN C) 500 MG tablet Take 1 tablet (500 mg total) by mouth daily.     Calcium Magnesium Zinc 333-133-5 MG TABS Take 1 tablet by mouth daily.     Cholecalciferol (VITAMIN D) 50 MCG (2000 UT) CAPS Take 2 capsules (4,000 Units total) by mouth daily.      cyanocobalamin (,VITAMIN B-12,) 1000 MCG/ML injection Inject 1 mL (1,000 mcg total) into the muscle every 30 (thirty) days. 3 mL 2   famotidine (PEPCID) 20 MG tablet Take 1 tablet (20 mg total) by mouth 2 (two) times daily. 180 tablet 1   hydrOXYzine (ATARAX) 10 MG tablet Take 1 tablet (10 mg total) by mouth daily as needed for itching (hives). 90 tablet 2   Imiquimod 3.75 % CREA Apply to affected area 2-3 times weekly for 6 weeks 7.5 g 0   Iron-Vitamins (GERITOL PO) Take 1 tablet by mouth daily.     Multiple Vitamin (MULTIVITAMIN) tablet Take 1 tablet by mouth daily.     predniSONE (DELTASONE) 20 MG tablet Take PO 3 tabs daily x3 days, then 2 tabs daily x3 days, then 1 tab daily x3 days, then one half tab daily x3 days, then stop 20 tablet 0   Syringe/Needle, Disp, (SYRINGE 3CC/25GX1") 25G X 1" 3 ML MISC Use with B12 injection once monthly 1 each 6   thiamine 100 MG tablet Take 1 tablet (100 mg total) by mouth daily.     triamcinolone (KENALOG) 0.025 % ointment Apply 1 Application topically 2 (two) times daily. 30 g 0   Turmeric 500 MG CAPS Take 1 capsule by mouth daily.     XOLAIR 150 MG/ML prefilled syringe Inject into the skin.     cetirizine (ZYRTEC ALLERGY) 10 MG tablet  Take 1 tablet (10 mg total) by mouth daily. 90 tablet 1   hydrochlorothiazide (HYDRODIURIL) 12.5 MG tablet Take 1 tablet (12.5 mg total) by mouth daily. 90 tablet 1   LORazepam (ATIVAN) 0.5 MG tablet TAKE 1 TABLET BY MOUTH TWICE A DAY AS NEEDED FOR ANXIETY Strength: 0.5 mg 30 tablet 0   methylphenidate (CONCERTA) 18 MG PO CR tablet Take 1 tablet (18 mg total) by mouth daily. 30 tablet 0   metoprolol succinate (TOPROL-XL) 50 MG 24 hr tablet Take 1 tablet (50 mg total) by mouth daily. Take with or immediately following a meal. 90 tablet 0   amoxicillin-clavulanate (AUGMENTIN) 875-125 MG tablet Take 1 tablet by mouth 2 (two) times daily. 20 tablet 0   No facility-administered medications prior to visit.     Per HPI unless  specifically indicated in ROS section below Review of Systems  Objective:  BP 134/88   Pulse 86   Temp 97.8 F (36.6 C) (Temporal)   Ht '5\' 5"'$  (1.651 m)   Wt 235 lb 4 oz (106.7 kg)   SpO2 99%   BMI 39.15 kg/m   Wt Readings from Last 3 Encounters:  05/04/22 235 lb 4 oz (106.7 kg)  04/14/22 246 lb (111.6 kg)  03/29/22 236 lb (107 kg)      Physical Exam Vitals and nursing note reviewed.  Constitutional:      Appearance: Normal appearance. Theresa Pratt is not ill-appearing.  Cardiovascular:     Rate and Rhythm: Normal rate and regular rhythm.     Pulses: Normal pulses.     Heart sounds: Normal heart sounds. No murmur heard. Pulmonary:     Effort: Pulmonary effort is normal. No respiratory distress.     Breath sounds: Normal breath sounds. No wheezing, rhonchi or rales.  Musculoskeletal:     Right lower leg: No edema.     Left lower leg: No edema.  Skin:    General: Skin is warm and dry.  Neurological:     Mental Status: Theresa Pratt is alert.  Psychiatric:        Mood and Affect: Mood normal.        Behavior: Behavior normal.       Results for orders placed or performed in visit on 12/27/21  TSH  Result Value Ref Range   TSH 0.72 0.35 - 5.50 uIU/mL    Assessment & Plan:   Problem List Items Addressed This Visit     Essential hypertension - Primary    Chronic, stable on current regimen - continue.       Relevant Medications   hydrochlorothiazide (HYDRODIURIL) 12.5 MG tablet   metoprolol succinate (TOPROL-XL) 50 MG 24 hr tablet   Allergic rhinitis    Continue allegra BID. singulair refilled per pt request.       Anxiety    Continue lorazepam PRN.       Relevant Medications   LORazepam (ATIVAN) 0.5 MG tablet   Chronic idiopathic urticaria    Established with allergist in [facility], receiving monthly Xolair injections with significant benefit. Also continues H1 and H2 antihistamines. singulair refilled.       Attention deficit hyperactivity disorder (ADHD), combined  type    Concerta refilled.       Relevant Medications   methylphenidate (CONCERTA) 18 MG PO CR tablet   Severe obesity (BMI 35.0-39.9) with comorbidity (HCC)   Relevant Medications   methylphenidate (CONCERTA) 18 MG PO CR tablet   Other Visit Diagnoses     Need  for influenza vaccination       Relevant Orders   Flu Vaccine QUAD 45moIM (Fluarix, Fluzone & Alfiuria Quad PF) (Completed)        Meds ordered this encounter  Medications   montelukast (SINGULAIR) 10 MG tablet    Sig: Take 1 tablet (10 mg total) by mouth at bedtime.    Dispense:  30 tablet    Refill:  11   methylphenidate (CONCERTA) 18 MG PO CR tablet    Sig: Take 1 tablet (18 mg total) by mouth daily.    Dispense:  30 tablet    Refill:  0    Allergic reaction to medication of focalin   LORazepam (ATIVAN) 0.5 MG tablet    Sig: TAKE 1 TABLET BY MOUTH TWICE A DAY AS NEEDED FOR ANXIETY Strength: 0.5 mg    Dispense:  30 tablet    Refill:  0   fexofenadine (ALLEGRA ALLERGY) 180 MG tablet    Sig: Take 1 tablet (180 mg total) by mouth in the morning and at bedtime.   hydrochlorothiazide (HYDRODIURIL) 12.5 MG tablet    Sig: Take 1 tablet (12.5 mg total) by mouth daily.    Dispense:  90 tablet    Refill:  2   metoprolol succinate (TOPROL-XL) 50 MG 24 hr tablet    Sig: Take 1 tablet (50 mg total) by mouth daily. Take with or immediately following a meal.    Dispense:  90 tablet    Refill:  2   Orders Placed This Encounter  Procedures   Flu Vaccine QUAD 665moM (Fluarix, Fluzone & Alfiuria Quad PF)     Patient Instructions  Flu shot today  Concerta and lorazepam refilled.  I've also refilled singulair.  Good to see you today. Blood pressures are looking better.  Continue hydrochlorothiazide and metoprolol succinate (Toprol XL).  You are doing well today Keep physical appointment in December.   Follow up plan: Return if symptoms worsen or fail to improve.  JaRia BushMD

## 2022-05-04 NOTE — Patient Instructions (Addendum)
Flu shot today  Concerta and lorazepam refilled.  I've also refilled singulair.  Good to see you today. Blood pressures are looking better.  Continue hydrochlorothiazide and metoprolol succinate (Toprol XL).  You are doing well today Keep physical appointment in December.

## 2022-05-04 NOTE — Assessment & Plan Note (Signed)
Continue lorazepam PRN  

## 2022-05-25 ENCOUNTER — Telehealth: Payer: Self-pay | Admitting: Family Medicine

## 2022-05-25 NOTE — Telephone Encounter (Signed)
Spoke to patient by telephone and was advised that she is seeing an allergy doctor for this and he has started her on medication. Patient stated that the allergy doctor told her it may take a while for the medication to start working. Patient was advised that she should call the allergy doctor that is treating her for this problem to see what other medications he may want to add. Patient stated that she will call her allergy doctor. Patient still requested that the message go back to Dr. Danise Mina since he is aware of this ongoing problem with itching and hives.  Patient stated since Dr. Danise Mina is aware of her problem he may go ahead and prescribe something for her. Pharmacy [facility]

## 2022-05-25 NOTE — Telephone Encounter (Signed)
Please ensure she continues allegra '180mg'$  twice daily and pepcid '20mg'$  once daily as well as singulair '10mg'$  nightly.  We last prescribed prednisone over the phone 03/2022.  Don't recommend continued over phone prescribing - agree with having her reach out to her allergist for this.  In interim she could add zyrtec '10mg'$  at night time as needed.

## 2022-05-25 NOTE — Telephone Encounter (Signed)
Patient called and stated she is itching and breaking out in hives and wanted to know if she can get some medication prescribed. Call back number [phone].

## 2022-05-25 NOTE — Telephone Encounter (Signed)
Note was sent to me and I called pt and she said she has ongoing hives and itching for years. Last night pt said hives on legs and back was itching more than usual. Pt has been on Xolair and singulair has been helping. Pt said she has been under a lot of stress this past wk which makes hives worse. Today hives still constant itch. Pt is trying to get intouch with allergist and wants Dr Darnell Level to be aware since he knows the pt well. Pt is not having any problem breathing and no swelling at neck, throat or mouth. Pt last saw Dr Darnell Level on 05/04/22. [facility] [place].sending note to Dr Darnell Level.

## 2022-05-25 NOTE — Telephone Encounter (Signed)
Patient notified as instructed by telephone and verbalized understanding. Patient stated that she is taking the medications listed. Patient stated that she did reach out to her allergist after calling here today. Patient stated her allergist suggested that she add  OTC Claritin at night and sent in a small amount of Prednisone. Patient stated that her allergist may consider in the future her taking Xolair shots twice a month. Patient will continue to be followed by the allergist.

## 2022-06-11 ENCOUNTER — Other Ambulatory Visit: Payer: Self-pay | Admitting: Family Medicine

## 2022-06-11 DIAGNOSIS — F902 Attention-deficit hyperactivity disorder, combined type: Secondary | ICD-10-CM

## 2022-06-11 NOTE — Telephone Encounter (Signed)
Noted.   Name of Medication: Concerta, Lorazepam Name of Pharmacy: [facility] Last Fill or Written Date and Quantity: 93/73/42      Concerta- #87      Lorazepm- #30 Last Office Visit and Type: 05/04/22 f/u Next Office Visit and Type: 07/06/22, CPE Last Controlled Substance Agreement Date: 10/30/17 Last UDS: 10/30/17

## 2022-06-11 NOTE — Telephone Encounter (Signed)
  Encourage patient to contact the pharmacy for refills or they can request refills through [employer]  Did the patient contact the pharmacy:  no   LAST APPOINTMENT DATE: 05/04/22  NEXT APPOINTMENT DATE:  MEDICATION:LORazepam (ATIVAN) 0.5 MG tablet  methylphenidate (CONCERTA) 18 MG PO CR tablet  Is the patient out of medication? yes  If not, how much is left?none  Is this a 90 day supply: 30  [id number] [place] [place] [address]: [phone] Fax: [phone]     Let patient know to contact pharmacy at the end of the day to make sure medication is ready.  Please notify patient to allow 48-72 hours to process

## 2022-06-12 MED ORDER — METHYLPHENIDATE HCL ER (OSM) 18 MG PO TBCR: 18.0000 mg | EXTENDED_RELEASE_TABLET | Freq: Every day | ORAL | 0 refills | Status: DC

## 2022-06-12 MED ORDER — LORAZEPAM 0.5 MG PO TABS: ORAL_TABLET | ORAL | 0 refills | Status: DC

## 2022-06-12 NOTE — Telephone Encounter (Signed)
Pt called asking for status of med refill for :LORazepam (ATIVAN) 0.5 MG tablet   methylphenidate (CONCERTA) 18 MG PO CR tablet   Call back # [phone]

## 2022-06-12 NOTE — Telephone Encounter (Signed)
Informed patient medication refill sent.

## 2022-06-12 NOTE — Telephone Encounter (Signed)
ERx. Plz notify pt.

## 2022-06-26 ENCOUNTER — Other Ambulatory Visit: Payer: Self-pay | Admitting: Family Medicine

## 2022-06-26 DIAGNOSIS — E559 Vitamin D deficiency, unspecified: Secondary | ICD-10-CM

## 2022-06-26 DIAGNOSIS — I10 Essential (primary) hypertension: Secondary | ICD-10-CM

## 2022-06-26 DIAGNOSIS — K909 Intestinal malabsorption, unspecified: Secondary | ICD-10-CM

## 2022-06-26 DIAGNOSIS — E519 Thiamine deficiency, unspecified: Secondary | ICD-10-CM

## 2022-06-26 DIAGNOSIS — E042 Nontoxic multinodular goiter: Secondary | ICD-10-CM

## 2022-06-26 DIAGNOSIS — E538 Deficiency of other specified B group vitamins: Secondary | ICD-10-CM

## 2022-06-27 ENCOUNTER — Other Ambulatory Visit (INDEPENDENT_AMBULATORY_CARE_PROVIDER_SITE_OTHER): Payer: BC Managed Care – PPO

## 2022-06-27 DIAGNOSIS — I10 Essential (primary) hypertension: Secondary | ICD-10-CM

## 2022-06-27 DIAGNOSIS — E559 Vitamin D deficiency, unspecified: Secondary | ICD-10-CM

## 2022-06-27 DIAGNOSIS — E538 Deficiency of other specified B group vitamins: Secondary | ICD-10-CM | POA: Diagnosis not present

## 2022-06-27 DIAGNOSIS — E042 Nontoxic multinodular goiter: Secondary | ICD-10-CM | POA: Diagnosis not present

## 2022-06-27 DIAGNOSIS — K909 Intestinal malabsorption, unspecified: Secondary | ICD-10-CM

## 2022-06-27 DIAGNOSIS — E519 Thiamine deficiency, unspecified: Secondary | ICD-10-CM

## 2022-06-27 LAB — VITAMIN D 25 HYDROXY (VIT D DEFICIENCY, FRACTURES): VITD: 25.3 ng/mL — ABNORMAL LOW (ref 30.00–100.00)

## 2022-06-27 LAB — CBC WITH DIFFERENTIAL/PLATELET
Basophils Absolute: 0 10*3/uL (ref 0.0–0.1)
Basophils Relative: 0.1 % (ref 0.0–3.0)
Eosinophils Absolute: 0.2 10*3/uL (ref 0.0–0.7)
Eosinophils Relative: 2.8 % (ref 0.0–5.0)
HCT: 39.9 % (ref 36.0–46.0)
Hemoglobin: 13.1 g/dL (ref 12.0–15.0)
Lymphocytes Relative: 17.3 % (ref 12.0–46.0)
Lymphs Abs: 1.2 10*3/uL (ref 0.7–4.0)
MCHC: 32.9 g/dL (ref 30.0–36.0)
MCV: 84.8 fl (ref 78.0–100.0)
Monocytes Absolute: 0.5 10*3/uL (ref 0.1–1.0)
Monocytes Relative: 7.4 % (ref 3.0–12.0)
Neutro Abs: 5.2 10*3/uL (ref 1.4–7.7)
Neutrophils Relative %: 72.4 % (ref 43.0–77.0)
Platelets: 364 10*3/uL (ref 150.0–400.0)
RBC: 4.7 Mil/uL (ref 3.87–5.11)
RDW: 14.3 % (ref 11.5–15.5)
WBC: 7.2 10*3/uL (ref 4.0–10.5)

## 2022-06-27 LAB — IBC PANEL
Iron: 69 ug/dL (ref 42–145)
Saturation Ratios: 17.4 % — ABNORMAL LOW (ref 20.0–50.0)
TIBC: 397.6 ug/dL (ref 250.0–450.0)
Transferrin: 284 mg/dL (ref 212.0–360.0)

## 2022-06-27 LAB — COMPREHENSIVE METABOLIC PANEL
ALT: 19 U/L (ref 0–35)
AST: 15 U/L (ref 0–37)
Albumin: 4 g/dL (ref 3.5–5.2)
Alkaline Phosphatase: 114 U/L (ref 39–117)
BUN: 11 mg/dL (ref 6–23)
CO2: 29 mEq/L (ref 19–32)
Calcium: 9.4 mg/dL (ref 8.4–10.5)
Chloride: 104 mEq/L (ref 96–112)
Creatinine, Ser: 0.8 mg/dL (ref 0.40–1.20)
GFR: 84.06 mL/min (ref >60.00)
Glucose, Bld: 103 mg/dL — ABNORMAL HIGH (ref 70–99)
Potassium: 4.1 mEq/L (ref 3.5–5.1)
Sodium: 141 mEq/L (ref 135–145)
Total Bilirubin: 0.4 mg/dL (ref 0.2–1.2)
Total Protein: 6.7 g/dL (ref 6.0–8.3)

## 2022-06-27 LAB — LIPID PANEL
Cholesterol: 184 mg/dL (ref 0–200)
HDL: 59 mg/dL (ref >39.00)
LDL Cholesterol: 100 mg/dL — ABNORMAL HIGH (ref 0–99)
NonHDL: 125.27
Total CHOL/HDL Ratio: 3
Triglycerides: 126 mg/dL (ref 0.0–149.0)
VLDL: 25.2 mg/dL (ref 0.0–40.0)

## 2022-06-27 LAB — TSH: TSH: 2.22 u[IU]/mL (ref 0.35–5.50)

## 2022-06-27 LAB — FERRITIN: Ferritin: 64.1 ng/mL (ref 10.0–291.0)

## 2022-06-27 LAB — VITAMIN B12: Vitamin B-12: 533 pg/mL (ref 211–911)

## 2022-07-01 LAB — VITAMIN B1: Vitamin B1 (Thiamine): 8 nmol/L (ref 8–30)

## 2022-07-06 ENCOUNTER — Encounter: Payer: BC Managed Care – PPO | Admitting: Family Medicine

## 2022-07-10 ENCOUNTER — Ambulatory Visit (INDEPENDENT_AMBULATORY_CARE_PROVIDER_SITE_OTHER): Payer: BC Managed Care – PPO | Admitting: Family Medicine

## 2022-07-10 ENCOUNTER — Encounter: Payer: Self-pay | Admitting: Family Medicine

## 2022-07-10 VITALS — BP 148/98 | HR 89 | Temp 97.2°F | Ht 65.5 in | Wt 235.4 lb

## 2022-07-10 DIAGNOSIS — E519 Thiamine deficiency, unspecified: Secondary | ICD-10-CM

## 2022-07-10 DIAGNOSIS — I10 Essential (primary) hypertension: Secondary | ICD-10-CM

## 2022-07-10 DIAGNOSIS — E559 Vitamin D deficiency, unspecified: Secondary | ICD-10-CM

## 2022-07-10 DIAGNOSIS — F419 Anxiety disorder, unspecified: Secondary | ICD-10-CM

## 2022-07-10 DIAGNOSIS — E538 Deficiency of other specified B group vitamins: Secondary | ICD-10-CM

## 2022-07-10 DIAGNOSIS — Z0001 Encounter for general adult medical examination with abnormal findings: Secondary | ICD-10-CM | POA: Diagnosis not present

## 2022-07-10 DIAGNOSIS — L501 Idiopathic urticaria: Secondary | ICD-10-CM

## 2022-07-10 DIAGNOSIS — Z0000 Encounter for general adult medical examination without abnormal findings: Secondary | ICD-10-CM | POA: Insufficient documentation

## 2022-07-10 DIAGNOSIS — F418 Other specified anxiety disorders: Secondary | ICD-10-CM | POA: Diagnosis not present

## 2022-07-10 DIAGNOSIS — F902 Attention-deficit hyperactivity disorder, combined type: Secondary | ICD-10-CM

## 2022-07-10 DIAGNOSIS — E042 Nontoxic multinodular goiter: Secondary | ICD-10-CM

## 2022-07-10 DIAGNOSIS — Z1211 Encounter for screening for malignant neoplasm of colon: Secondary | ICD-10-CM | POA: Diagnosis not present

## 2022-07-10 DIAGNOSIS — K909 Intestinal malabsorption, unspecified: Secondary | ICD-10-CM

## 2022-07-10 DIAGNOSIS — J309 Allergic rhinitis, unspecified: Secondary | ICD-10-CM

## 2022-07-10 MED ORDER — AMLODIPINE BESYLATE 2.5 MG PO TABS: 2.5000 mg | ORAL_TABLET | Freq: Every day | ORAL | 9 refills | Status: DC

## 2022-07-10 MED ORDER — METHYLPHENIDATE HCL ER (OSM) 18 MG PO TBCR: 18.0000 mg | EXTENDED_RELEASE_TABLET | Freq: Every day | ORAL | 0 refills | Status: DC

## 2022-07-10 MED ORDER — VITAMIN D3 1.25 MG (50000 UT) PO TABS: 1.0000 | ORAL_TABLET | ORAL | 3 refills | Status: DC

## 2022-07-10 MED ORDER — SLOW RELEASE IRON 160 (50 FE) MG PO TBCR: 160.0000 mg | EXTENDED_RELEASE_TABLET | Freq: Every day | ORAL | Status: AC

## 2022-07-10 MED ORDER — LORAZEPAM 0.5 MG PO TABS: ORAL_TABLET | ORAL | 0 refills | Status: DC

## 2022-07-10 NOTE — Patient Instructions (Addendum)
Stop hydrochlorothiazide and in its place start amlodipine 2.'5mg'$  daily. Continue metoprolol.  You may call [facility] at [phone] to schedule an appointment for colonoscopy.  Continue slow release iron  Start vitamin D 50,000 units weekly - prescription dose sent to pharmacy.  We will refer you to counselor. Return in 3 months for follow up visit.   Health Maintenance, Female Adopting a healthy lifestyle and getting preventive care are important in promoting health and wellness. Ask your health care provider about: The right schedule for you to have regular tests and exams. Things you can do on your own to prevent diseases and keep yourself healthy. What should I know about diet, weight, and exercise? Eat a healthy diet  Eat a diet that includes plenty of vegetables, fruits, low-fat dairy products, and lean protein. Do not eat a lot of foods that are high in solid fats, added sugars, or sodium. Maintain a healthy weight Body mass index (BMI) is used to identify weight problems. It estimates body fat based on height and weight. Your health care provider can help determine your BMI and help you achieve or maintain a healthy weight. Get regular exercise Get regular exercise. This is one of the most important things you can do for your health. Most adults should: Exercise for at least 150 minutes each week. The exercise should increase your heart rate and make you sweat (moderate-intensity exercise). Do strengthening exercises at least twice a week. This is in addition to the moderate-intensity exercise. Spend less time sitting. Even light physical activity can be beneficial. Watch cholesterol and blood lipids Have your blood tested for lipids and cholesterol at 53 years of age, then have this test every 5 years. Have your cholesterol levels checked more often if: Your lipid or cholesterol levels are high. You are older than 53 years of age. You are at high risk for heart  disease. What should I know about cancer screening? Depending on your health history and family history, you may need to have cancer screening at various ages. This may include screening for: Breast cancer. Cervical cancer. Colorectal cancer. Skin cancer. Lung cancer. What should I know about heart disease, diabetes, and high blood pressure? Blood pressure and heart disease High blood pressure causes heart disease and increases the risk of stroke. This is more likely to develop in people who have high blood pressure readings or are overweight. Have your blood pressure checked: Every 3-5 years if you are 43-61 years of age. Every year if you are 62 years old or older. Diabetes Have regular diabetes screenings. This checks your fasting blood sugar level. Have the screening done: Once every three years after age 75 if you are at a normal weight and have a low risk for diabetes. More often and at a younger age if you are overweight or have a high risk for diabetes. What should I know about preventing infection? Hepatitis B If you have a higher risk for hepatitis B, you should be screened for this virus. Talk with your health care provider to find out if you are at risk for hepatitis B infection. Hepatitis C Testing is recommended for: Everyone born from [birth year] through 1967. Anyone with known risk factors for hepatitis C. Sexually transmitted infections (STIs) Get screened for STIs, including gonorrhea and chlamydia, if: You are sexually active and are younger than 53 years of age. You are older than 53 years of age and your health care provider tells you that you are at risk  for this type of infection. Your sexual activity has changed since you were last screened, and you are at increased risk for chlamydia or gonorrhea. Ask your health care provider if you are at risk. Ask your health care provider about whether you are at high risk for HIV. Your health care provider may recommend a  prescription medicine to help prevent HIV infection. If you choose to take medicine to prevent HIV, you should first get tested for HIV. You should then be tested every 3 months for as long as you are taking the medicine. Pregnancy If you are about to stop having your period (premenopausal) and you may become pregnant, seek counseling before you get pregnant. Take 400 to 800 micrograms (mcg) of folic acid every day if you become pregnant. Ask for birth control (contraception) if you want to prevent pregnancy. Osteoporosis and menopause Osteoporosis is a disease in which the bones lose minerals and strength with aging. This can result in bone fractures. If you are 25 years old or older, or if you are at risk for osteoporosis and fractures, ask your health care provider if you should: Be screened for bone loss. Take a calcium or vitamin D supplement to lower your risk of fractures. Be given hormone replacement therapy (HRT) to treat symptoms of menopause. Follow these instructions at home: Alcohol use Do not drink alcohol if: Your health care provider tells you not to drink. You are pregnant, may be pregnant, or are planning to become pregnant. If you drink alcohol: Limit how much you have to: 0-1 drink a day. Know how much alcohol is in your drink. In the [place] one drink equals one 12 oz bottle of beer (355 mL), one 5 oz glass of wine (148 mL), or one 1 oz glass of hard liquor (44 mL). Lifestyle Do not use any products that contain nicotine or tobacco. These products include cigarettes, chewing tobacco, and vaping devices, such as e-cigarettes. If you need help quitting, ask your health care provider. Do not use street drugs. Do not share needles. Ask your health care provider for help if you need support or information about quitting drugs. General instructions Schedule regular health, dental, and eye exams. Stay current with your vaccines. Tell your health care provider if: You often  feel depressed. You have ever been abused or do not feel safe at home. Summary Adopting a healthy lifestyle and getting preventive care are important in promoting health and wellness. Follow your health care provider's instructions about healthy diet, exercising, and getting tested or screened for diseases. Follow your health care provider's instructions on monitoring your cholesterol and blood pressure. This information is not intended to replace advice given to you by your health care provider. Make sure you discuss any questions you have with your health care provider. Document Revised: 11/21/2020 Document Reviewed: 11/21/2020 Elsevier Patient Education  [place].

## 2022-07-10 NOTE — Assessment & Plan Note (Addendum)
Chronic, on lorazepam PRN. Desires to establish with new therapist - referral placed. Consider daily anxiety medication.

## 2022-07-10 NOTE — Progress Notes (Unsigned)
Patient ID: Theresa Pratt, female    DOB: October 09, 1970, 51 y.o.   MRN: [id number]  This visit was conducted in person.  BP (!) 148/98   Pulse 89   Temp (!) 97.2 F (36.2 C) (Temporal)   Ht 5' 5.5" (1.664 m)   Wt 235 lb 6.4 oz (106.8 kg)   SpO2 97%   BMI 38.58 kg/m    CC: CPE Subjective:   HPI: Theresa Pratt is a 51 y.o. female presenting on 07/10/2022 for Annual Exam (Pt brought in home BP monitor to compare. Reading in office today- 150/110.)   Stressed with extra classes she has to take through work. Interested in counseling - requests referral.   S/p gastric bypass (Roux-en-y 2011).    Vitamin B12 deficiency - continues monthly injections at home.   BP elevated today - forgot BP meds last night. Normally takes hctz 12.'5mg'$  daily as well as toprol XL '50mg'$  daily.    Chronic idiopathic urticaria - first Xolair injection 04/18/2022, also takes allegra '180mg'$  twice daily, and pepcid '20mg'$  once daily. Now on xolair Q2 wks, planning to add another medication as well (?cyclosporine). Occasional zyrtec at night PRN. Last visit we refilled singulair '10mg'$  nightly. Sees allergist Dr Grandville Silos in [place].   Upcoming appt 08/13/2022 for 2nd opinion with dermatologist at [facility].   Preventative: Colon cancer screening - did not complete colonoscopy last year - will refer to [facility] per her request  Well woman - [place] saw Dr Roselie Awkward 02/2021. Last pap 2020 LMP - 02/2022 Mammogram 02/2022 Birads1 @ [place] [facility]  Lung cancer screening - not eligible  Flu shot yearly  Td - 02/2009. Tdap 05/2020  COVID vaccine Moderna 09/2019, 10/2019, booster 05/2020 Shingrix - 05/2020, 08/2020 Seat belt use discussed  Sunscreen use discussed. No changing moles on skin.  Non smoker  Alcohol - seldom  Dentist - q6 mo  Eye exam yearly   Lives with parents, husband, 2 children and small dog   Occupation: new job at [employer], [place] off Activity: did join gym but no  regular exercise at this time  Diet: good water, fruits/vegetables daily      Relevant past medical, surgical, family and social history reviewed and updated as indicated. Interim medical history since our last visit reviewed. Allergies and medications reviewed and updated. Outpatient Medications Prior to Visit  Medication Sig Dispense Refill   ascorbic acid (VITAMIN C) 500 MG tablet Take 1 tablet (500 mg total) by mouth daily.     Calcium Magnesium Zinc 333-133-5 MG TABS Take 1 tablet by mouth daily.     cyanocobalamin (,VITAMIN B-12,) 1000 MCG/ML injection Inject 1 mL (1,000 mcg total) into the muscle every 30 (thirty) days. 3 mL 2   famotidine (PEPCID) 20 MG tablet Take 1 tablet (20 mg total) by mouth 2 (two) times daily. 180 tablet 1   fexofenadine (ALLEGRA ALLERGY) 180 MG tablet Take 1 tablet (180 mg total) by mouth in the morning and at bedtime.     hydrOXYzine (ATARAX) 10 MG tablet Take 1 tablet (10 mg total) by mouth daily as needed for itching (hives). 90 tablet 2   Imiquimod 3.75 % CREA Apply to affected area 2-3 times weekly for 6 weeks 7.5 g 0   Iron-Vitamins (GERITOL PO) Take 1 tablet by mouth daily.     metoprolol succinate (TOPROL-XL) 50 MG 24 hr tablet Take 1 tablet (50 mg total) by mouth daily. Take with or  immediately following a meal. 90 tablet 2   montelukast (SINGULAIR) 10 MG tablet Take 1 tablet (10 mg total) by mouth at bedtime. 30 tablet 11   Multiple Vitamin (MULTIVITAMIN) tablet Take 1 tablet by mouth daily.     Syringe/Needle, Disp, (SYRINGE 3CC/25GX1") 25G X 1" 3 ML MISC Use with B12 injection once monthly 1 each 6   thiamine 100 MG tablet Take 1 tablet (100 mg total) by mouth daily.     triamcinolone (KENALOG) 0.025 % ointment Apply 1 Application topically 2 (two) times daily. 30 g 0   XOLAIR 150 MG/ML prefilled syringe Inject into the skin.     Cholecalciferol (VITAMIN D) 50 MCG (2000 UT) CAPS Take 2 capsules (4,000 Units total) by mouth daily.      hydrochlorothiazide (HYDRODIURIL) 12.5 MG tablet Take 1 tablet (12.5 mg total) by mouth daily. 90 tablet 2   LORazepam (ATIVAN) 0.5 MG tablet TAKE 1 TABLET BY MOUTH TWICE A DAY AS NEEDED FOR ANXIETY Strength: 0.5 mg 30 tablet 0   methylphenidate (CONCERTA) 18 MG PO CR tablet Take 1 tablet (18 mg total) by mouth daily. 30 tablet 0   Turmeric 500 MG CAPS Take 1 capsule by mouth daily.     No facility-administered medications prior to visit.     Per HPI unless specifically indicated in ROS section below Review of Systems  Constitutional:  Negative for activity change, appetite change, chills, fatigue, fever and unexpected weight change.  HENT:  Negative for hearing loss.   Eyes:  Negative for visual disturbance.  Respiratory:  Negative for cough, chest tightness, shortness of breath and wheezing.   Cardiovascular:  Positive for palpitations (occ). Negative for chest pain and leg swelling.  Gastrointestinal:  Positive for abdominal pain (occ) and diarrhea (occ - managed with probiotic). Negative for abdominal distention, blood in stool, constipation, nausea and vomiting.  Genitourinary:  Negative for difficulty urinating and hematuria.  Musculoskeletal:  Negative for arthralgias, myalgias and neck pain.       Cramping of feet  Skin:  Negative for rash.  Neurological:  Positive for headaches (occ). Negative for dizziness, seizures and syncope.  Hematological:  Negative for adenopathy. Does not bruise/bleed easily.  Psychiatric/Behavioral:  Negative for dysphoric mood. The patient is nervous/anxious.     Objective:  BP (!) 148/98   Pulse 89   Temp (!) 97.2 F (36.2 C) (Temporal)   Ht 5' 5.5" (1.664 m)   Wt 235 lb 6.4 oz (106.8 kg)   SpO2 97%   BMI 38.58 kg/m   Wt Readings from Last 3 Encounters:  07/10/22 235 lb 6.4 oz (106.8 kg)  05/04/22 235 lb 4 oz (106.7 kg)  04/14/22 246 lb (111.6 kg)      Physical Exam Vitals and nursing note reviewed.  Constitutional:      Appearance:  Normal appearance. She is not ill-appearing.  HENT:     Head: Normocephalic and atraumatic.     Right Ear: Tympanic membrane, ear canal and external ear normal. There is no impacted cerumen.     Left Ear: Tympanic membrane, ear canal and external ear normal. There is no impacted cerumen.     Nose: Nose normal.     Mouth/Throat:     Mouth: Mucous membranes are moist.     Pharynx: Oropharynx is clear. No oropharyngeal exudate or posterior oropharyngeal erythema.  Eyes:     General:        Right eye: No discharge.  Left eye: No discharge.     Extraocular Movements: Extraocular movements intact.     Conjunctiva/sclera: Conjunctivae normal.     Pupils: Pupils are equal, round, and reactive to light.  Neck:     Thyroid: No thyroid mass or thyromegaly.  Cardiovascular:     Rate and Rhythm: Normal rate and regular rhythm.     Pulses: Normal pulses.     Heart sounds: Normal heart sounds. No murmur heard. Pulmonary:     Effort: Pulmonary effort is normal. No respiratory distress.     Breath sounds: Normal breath sounds. No wheezing, rhonchi or rales.  Abdominal:     General: Bowel sounds are normal. There is no distension.     Palpations: Abdomen is soft. There is no mass.     Tenderness: There is no abdominal tenderness. There is no guarding or rebound.     Hernia: No hernia is present.  Musculoskeletal:     Cervical back: Normal range of motion and neck supple. No rigidity.     Right lower leg: No edema.     Left lower leg: No edema.  Lymphadenopathy:     Cervical: No cervical adenopathy.  Skin:    General: Skin is warm and dry.     Findings: No rash.  Neurological:     General: No focal deficit present.     Mental Status: She is alert. Mental status is at baseline.  Psychiatric:        Mood and Affect: Mood normal.        Behavior: Behavior normal.       Results for orders placed or performed in visit on 06/27/22  Lipid panel  Result Value Ref Range   Cholesterol 184  0 - 200 mg/dL   Triglycerides 126.0 0.0 - 149.0 mg/dL   HDL 59.00 >39.00 mg/dL   VLDL 25.2 0.0 - 40.0 mg/dL   LDL Cholesterol 100 (H) 0 - 99 mg/dL   Total CHOL/HDL Ratio 3    NonHDL 125.27   CBC with Differential/Platelet  Result Value Ref Range   WBC 7.2 4.0 - 10.5 K/uL   RBC 4.70 3.87 - 5.11 Mil/uL   Hemoglobin 13.1 12.0 - 15.0 g/dL   HCT 39.9 36.0 - 46.0 %   MCV 84.8 78.0 - 100.0 fl   MCHC 32.9 30.0 - 36.0 g/dL   RDW 14.3 11.5 - 15.5 %   Platelets 364.0 150.0 - 400.0 K/uL   Neutrophils Relative % 72.4 43.0 - 77.0 %   Lymphocytes Relative 17.3 12.0 - 46.0 %   Monocytes Relative 7.4 3.0 - 12.0 %   Eosinophils Relative 2.8 0.0 - 5.0 %   Basophils Relative 0.1 0.0 - 3.0 %   Neutro Abs 5.2 1.4 - 7.7 K/uL   Lymphs Abs 1.2 0.7 - 4.0 K/uL   Monocytes Absolute 0.5 0.1 - 1.0 K/uL   Eosinophils Absolute 0.2 0.0 - 0.7 K/uL   Basophils Absolute 0.0 0.0 - 0.1 K/uL  TSH  Result Value Ref Range   TSH 2.22 0.35 - 5.50 uIU/mL  Comprehensive metabolic panel  Result Value Ref Range   Sodium 141 135 - 145 mEq/L   Potassium 4.1 3.5 - 5.1 mEq/L   Chloride 104 96 - 112 mEq/L   CO2 29 19 - 32 mEq/L   Glucose, Bld 103 (H) 70 - 99 mg/dL   BUN 11 6 - 23 mg/dL   Creatinine, Ser 0.80 0.40 - 1.20 mg/dL   Total Bilirubin 0.4 0.2 -  1.2 mg/dL   Alkaline Phosphatase 114 39 - 117 U/L   AST 15 0 - 37 U/L   ALT 19 0 - 35 U/L   Total Protein 6.7 6.0 - 8.3 g/dL   Albumin 4.0 3.5 - 5.2 g/dL   GFR 84.06 >60.00 mL/min   Calcium 9.4 8.4 - 10.5 mg/dL  VITAMIN D 25 Hydroxy (Vit-D Deficiency, Fractures)  Result Value Ref Range   VITD 25.30 (L) 30.00 - 100.00 ng/mL  Ferritin  Result Value Ref Range   Ferritin 64.1 10.0 - 291.0 ng/mL  IBC panel  Result Value Ref Range   Iron 69 42 - 145 ug/dL   Transferrin 284.0 212.0 - 360.0 mg/dL   Saturation Ratios 17.4 (L) 20.0 - 50.0 %   TIBC 397.6 250.0 - 450.0 mcg/dL  Vitamin B1  Result Value Ref Range   Vitamin B1 (Thiamine) 8 8 - 30 nmol/L  Vitamin B12   Result Value Ref Range   Vitamin B-12 533 211 - 911 pg/mL      07/10/2022   12:10 PM 07/07/2021   11:46 AM 01/20/2019    3:19 PM  Depression screen PHQ 2/9  Decreased Interest '2 2 3  '$ Down, Depressed, Hopeless 2 1 0  PHQ - 2 Score '4 3 3  '$ Altered sleeping 2 0 0  Tired, decreased energy '2 1 2  '$ Change in appetite 2 0 1  Feeling bad or failure about yourself  '2 1 1  '$ Trouble concentrating 1 1 0  Moving slowly or fidgety/restless '1 1 1  '$ Suicidal thoughts 0 0 0  PHQ-9 Score '14 7 8  '$ Difficult doing work/chores Very difficult         07/10/2022   12:10 PM 07/07/2021   11:46 AM 01/20/2019    3:19 PM  GAD 7 : Generalized Anxiety Score  Nervous, Anxious, on Edge '3 1 3  '$ Control/stop worrying '2 1 1  '$ Worry too much - different things '2 1 1  '$ Trouble relaxing 2 0 1  Restless 0 0 0  Easily annoyed or irritable '3 1 1  '$ Afraid - awful might happen 2 0 1  Total GAD 7 Score '14 4 8  '$ Anxiety Difficulty Very difficult     Assessment & Plan:   Problem List Items Addressed This Visit     Encounter for general adult medical examination with abnormal findings - Primary (Chronic)    Preventative protocols reviewed and updated unless pt declined. Discussed healthy diet and lifestyle.  # provided to schedule colonoscopy with [facility]      Essential hypertension    Chronic, deteriorated in setting of missed doses last night and increased stressors.  See above - stop hctz 12.'5mg'$ , start amlodipine 2.'5mg'$  daily in its place. Continue to monitor BP at home, update [place] with effect.      Relevant Medications   amLODipine (NORVASC) 2.5 MG tablet   Vitamin D deficiency    Levels low - start weekly Rx replacement for the next year.       Allergic rhinitis    Stable period on allegra and singulair       Iron malabsorption    Stable iron levels on MVI with iron + slow release iron- continue this.       Nontoxic multinodular goiter    Thyroid function remains stable.      Anxiety with  depression    Chronic, on lorazepam PRN. Desires to establish with new therapist - referral placed. Consider daily anxiety medication.  Relevant Medications   LORazepam (ATIVAN) 0.5 MG tablet   Other Relevant Orders   Ambulatory referral to Psychology   Chronic idiopathic urticaria    Continues seeing [facility] allergist, now on xolair injections, discussing possible cyclosporine addition due to noted ongoing breakthrough hives.  She has sulfa drugs listed as allergy. She is on hctz - will stop this medication and monitor effect on hives.  She has 2nd opinion appt scheduled with [facility] dermatology 08/13/2022.       Attention deficit hyperactivity disorder (ADHD), combined type    Chronic, stable period on concerta '18mg'$  daily.  [place] CSRS reviewe.d       Relevant Medications   methylphenidate (CONCERTA) 18 MG PO CR tablet   Vitamin B12 deficiency    Continue monthly b12 replacement.       Severe obesity (BMI 35.0-39.9) with comorbidity ([place])    Continue to encourage healthy diet and lifestyle choices to affect sustainable weight loss.       Relevant Medications   methylphenidate (CONCERTA) 18 MG PO CR tablet   Thiamine deficiency    Continue daily thiamine replacement.       Other Visit Diagnoses     Special screening for malignant neoplasms, colon       Relevant Orders   Ambulatory referral to Gastroenterology        Meds ordered this encounter  Medications   LORazepam (ATIVAN) 0.5 MG tablet    Sig: TAKE 1 TABLET BY MOUTH TWICE A DAY AS NEEDED FOR ANXIETY Strength: 0.5 mg    Dispense:  30 tablet    Refill:  0   methylphenidate (CONCERTA) 18 MG PO CR tablet    Sig: Take 1 tablet (18 mg total) by mouth daily.    Dispense:  30 tablet    Refill:  0    Allergic reaction to medication of focalin   amLODipine (NORVASC) 2.5 MG tablet    Sig: Take 1 tablet (2.5 mg total) by mouth daily.    Dispense:  30 tablet    Refill:  9    To replace hydrochlorothiazide    Cholecalciferol (VITAMIN D3) 1.25 MG (50000 UT) TABS    Sig: Take 1 tablet by mouth once a week.    Dispense:  12 tablet    Refill:  3   ferrous sulfate (SLOW RELEASE IRON) 160 (50 Fe) MG TBCR SR tablet    Sig: Take 1 tablet (160 mg total) by mouth daily.   Orders Placed This Encounter  Procedures   Ambulatory referral to Gastroenterology    Referral Priority:   Routine    Referral Type:   Consultation    Referral Reason:   Specialty Services Required    Number of Visits Requested:   1   Ambulatory referral to Psychology    Referral Priority:   Routine    Referral Type:   Psychiatric    Referral Reason:   Specialty Services Required    Requested Specialty:   Psychology    Number of Visits Requested:   1    Patient instructions: Stop hydrochlorothiazide and in its place start amlodipine 2.'5mg'$  daily. Continue metoprolol.  You may call [facility] at [phone] to schedule an appointment for colonoscopy.  Continue slow release iron  Start vitamin D 50,000 units weekly - prescription dose sent to pharmacy.  We will refer you to counselor. Return in 3 months for follow up visit.   Follow up plan: Return in about 3 months (around 10/09/2022)  for follow up visit.  [name], MD

## 2022-07-10 NOTE — Assessment & Plan Note (Addendum)
Preventative protocols reviewed and updated unless pt declined. Discussed healthy diet and lifestyle.  # provided to schedule colonoscopy with [facility]

## 2022-07-10 NOTE — Assessment & Plan Note (Addendum)
Stable period on allegra and singulair

## 2022-07-11 ENCOUNTER — Telehealth: Payer: Self-pay | Admitting: Gastroenterology

## 2022-07-11 DIAGNOSIS — Z1211 Encounter for screening for malignant neoplasm of colon: Secondary | ICD-10-CM

## 2022-07-11 NOTE — Assessment & Plan Note (Addendum)
Continues seeing [facility] allergist, now on xolair injections, discussing possible cyclosporine addition due to noted ongoing breakthrough hives.  She has sulfa drugs listed as allergy. She is on hctz - will stop this medication and monitor effect on hives.  She has 2nd opinion appt scheduled with [facility] dermatology 08/13/2022.

## 2022-07-11 NOTE — Assessment & Plan Note (Signed)
Continue to encourage healthy diet and lifestyle choices to affect sustainable weight loss.  °

## 2022-07-11 NOTE — Assessment & Plan Note (Addendum)
Stable iron levels on MVI with iron + slow release iron- continue this.

## 2022-07-11 NOTE — Assessment & Plan Note (Signed)
Levels low - start weekly Rx replacement for the next year.

## 2022-07-11 NOTE — Assessment & Plan Note (Addendum)
Chronic, deteriorated in setting of missed doses last night and increased stressors.  See above - stop hctz 12.'5mg'$ , start amlodipine 2.'5mg'$  daily in its place. Continue to monitor BP at home, update [place] with effect.

## 2022-07-11 NOTE — Assessment & Plan Note (Signed)
Continue daily thiamine replacement.

## 2022-07-11 NOTE — Assessment & Plan Note (Signed)
Thyroid function remains stable.

## 2022-07-11 NOTE — Assessment & Plan Note (Signed)
Continue monthly b12 replacement.

## 2022-07-11 NOTE — Telephone Encounter (Signed)
Patient called to schedule colonoscopy. Requests a call back.

## 2022-07-11 NOTE — Assessment & Plan Note (Signed)
Chronic, stable period on concerta '18mg'$  daily.  [place] CSRS reviewe.d

## 2022-07-12 ENCOUNTER — Other Ambulatory Visit (HOSPITAL_COMMUNITY)
Admission: RE | Admit: 2022-07-12 | Discharge: 2022-07-12 | Disposition: A | Discharge status: Home / Self Care | Payer: BC Managed Care – PPO | Source: Ambulatory Visit | Attending: Advanced Practice Midwife | Admitting: Advanced Practice Midwife

## 2022-07-12 ENCOUNTER — Other Ambulatory Visit: Payer: Self-pay

## 2022-07-12 ENCOUNTER — Ambulatory Visit (INDEPENDENT_AMBULATORY_CARE_PROVIDER_SITE_OTHER): Payer: BC Managed Care – PPO | Admitting: Advanced Practice Midwife

## 2022-07-12 ENCOUNTER — Encounter: Payer: Self-pay | Admitting: Advanced Practice Midwife

## 2022-07-12 VITALS — BP 149/98 | HR 84 | Wt 237.0 lb

## 2022-07-12 DIAGNOSIS — Z8619 Personal history of other infectious and parasitic diseases: Secondary | ICD-10-CM

## 2022-07-12 DIAGNOSIS — Z01419 Encounter for gynecological examination (general) (routine) without abnormal findings: Secondary | ICD-10-CM

## 2022-07-12 DIAGNOSIS — N951 Menopausal and female climacteric states: Secondary | ICD-10-CM | POA: Diagnosis not present

## 2022-07-12 DIAGNOSIS — D219 Benign neoplasm of connective and other soft tissue, unspecified: Secondary | ICD-10-CM

## 2022-07-12 DIAGNOSIS — N898 Other specified noninflammatory disorders of vagina: Secondary | ICD-10-CM | POA: Diagnosis not present

## 2022-07-12 DIAGNOSIS — Z1211 Encounter for screening for malignant neoplasm of colon: Secondary | ICD-10-CM

## 2022-07-12 MED ORDER — PODOFILOX 0.5 % EX GEL: Freq: Two times a day (BID) | CUTANEOUS | 0 refills | Status: AC

## 2022-07-12 MED ORDER — NA SULFATE-K SULFATE-MG SULF 17.5-3.13-1.6 GM/177ML PO SOLN: 1.0000 | Freq: Once | ORAL | 0 refills | Status: AC

## 2022-07-12 NOTE — Progress Notes (Signed)
GYNECOLOGY ANNUAL PREVENTATIVE CARE ENCOUNTER NOTE  History:     Theresa Pratt is a 51 y.o. [phone] female here for a routine annual gynecologic exam.  Current complaints: patient has not had a menstrual cycle since August 2023. She reports vaginal dryness and abdominal "burning" since that time. She has an existing diagnosis of genital warts and requests outpatient prescription for topical cream to manage a scant number of very small lesions.   Denies abnormal vaginal bleeding, discharge, pelvic pain, problems with intercourse or other gynecologic concerns.   1 partner x 30 years. Works as an Tourist information centre manager. Looks forward to likely retiring June 2024.    Gynecologic History No LMP recorded. (Menstrual status: Perimenopausal). Contraception:  perimenopausal Last Pap: 01/2019. Result was normal with negative HPV Last Mammogram: 0815/2023.  Result was normal Last Colonoscopy: due, scheduled by PCP during today's visit.    Obstetric History OB History  Gravida Para Term Preterm AB Living  _0 SAB IAB Ectopic Multiple Live Births               # Outcome Date GA Lbr Len/2nd Weight Sex Delivery Anes PTL Lv  2 Term           1 Term             Past Medical History:  Diagnosis Date   ADHD, adult residual type    Allergic rhinitis    post gastric bypass   Allergy    Allergy status to other drugs, medicaments and biological substances 12/22/2021   Anxiety    Arthropathy    weight bearing (pre gastric bypass)   Chronic urticaria    Diarrhea, functional    DM (diabetes mellitus) ([place])    pre gastric bypass, pt states she has not been treated for it in years, her doctor doesn't discuss it with her anymore   GERD (gastroesophageal reflux disease)    post gastric bypass   Heart murmur    had it in the past   History of chicken pox    History of open reduction and internal fixation (ORIF) procedure 02/21/2017   HTN (hypertension)    Pre gastric bypass   HTN (hypertension)     post gastric bypass   IDA (iron deficiency anemia)    post gastric bypass (iron infusion by hematology-doesn't build stores -Dr. 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 30 tablet 9   ascorbic acid (VITAMIN C)  500 MG tablet Take 1 tablet (500 mg total) by mouth daily.     Calcium Magnesium Zinc 333-133-5 MG TABS Take 1 tablet by mouth daily.     Cholecalciferol (VITAMIN D3) 1.25 MG (50000 UT) TABS Take 1 tablet by mouth once a week. 12 tablet 3   cyanocobalamin (,VITAMIN B-12,) 1000 MCG/ML injection Inject 1 mL (1,000 mcg total) into the muscle every 30 (thirty) days. 3 mL 2   famotidine (PEPCID) 20 MG tablet Take 1 tablet (20 mg total) by mouth 2 (two) times daily. 180 tablet 1   ferrous sulfate (SLOW RELEASE IRON) 160 (50 Fe) MG TBCR SR tablet Take 1 tablet (160 mg total) by mouth daily.     fexofenadine (ALLEGRA ALLERGY) 180 MG tablet Take 1 tablet (180 mg total) by mouth in the morning and at bedtime.     hydrOXYzine (ATARAX) 10 MG tablet Take 1 tablet (10 mg total) by mouth daily as needed for itching  (hives). 90 tablet 2   Imiquimod 3.75 % CREA Apply to affected area 2-3 times weekly for 6 weeks 7.5 g 0   Iron-Vitamins (GERITOL PO) Take 1 tablet by mouth daily.     LORazepam (ATIVAN) 0.5 MG tablet TAKE 1 TABLET BY MOUTH TWICE A DAY AS NEEDED FOR ANXIETY Strength: 0.5 mg 30 tablet 0   methylphenidate (CONCERTA) 18 MG PO CR tablet Take 1 tablet (18 mg total) by mouth daily. 30 tablet 0   metoprolol succinate (TOPROL-XL) 50 MG 24 hr tablet Take 1 tablet (50 mg total) by mouth daily. Take with or immediately following a meal. 90 tablet 2   montelukast (SINGULAIR) 10 MG tablet Take 1 tablet (10 mg total) by mouth at bedtime. 30 tablet 11   Multiple Vitamin (MULTIVITAMIN) tablet Take 1 tablet by mouth daily.     Na Sulfate-K Sulfate-Mg Sulf 17.5-3.13-1.6 GM/177ML SOLN Take 1 kit by mouth once for 1 dose. 354 mL 0   Syringe/Needle, Disp, (SYRINGE 3CC/25GX1") 25G X 1" 3 ML MISC Use with B12 injection once monthly 1 each 6   thiamine 100 MG tablet Take 1 tablet (100 mg total) by mouth daily.     triamcinolone (KENALOG) 0.025 % ointment Apply 1 Application topically 2 (two) times daily. 30 g 0   XOLAIR 150 MG/ML prefilled syringe Inject into the skin.     No current facility-administered medications on file prior to visit.    Allergies  Allergen Reactions   Focalin [Dexmethylphenidate] Hives and Swelling    Does ok on Concerta   Sertraline Hives   Sulfa Antibiotics     Rash on occasion    Social History:  reports that she has never smoked. She has never used smokeless tobacco. She reports current alcohol use. She reports that she does not use drugs.  Family History  Problem Relation Age of Onset   Coronary artery disease Father        2 stents; + smoker   Hypertension Father    Thyroid disease Brother    Diabetes Maternal Grandmother    Cirrhosis Paternal Grandmother    Alcohol abuse Paternal Grandmother    Breast cancer Other        Paternal great aunt(obesity, smoker, EtOHic)    Breast cancer Other        Maternal great aunt (deceased in 105's from same)   Thyroid disease Other        cousins and grandmother   Cancer Other  great aunt - lung   Liver disease Paternal Aunt    Cancer Cousin        liver, bile duct   Cancer Mother        uterine   Uterine cancer Mother    Colon polyps Neg Hx    Colon cancer Neg Hx    Esophageal cancer Neg Hx    Rectal cancer Neg Hx    Stomach cancer Neg Hx     The following portions of the patient's history were reviewed and updated as appropriate: allergies, current medications, past family history, past medical history, past social history, past surgical history and problem list.  Review of Systems Pertinent items noted in HPI and remainder of comprehensive ROS otherwise negative.  Physical Exam:  BP (!) 149/98   Pulse 84   Wt 237 lb (107.5 kg)   BMI 38.84 kg/m  CONSTITUTIONAL: Well-developed, well-nourished female in no acute distress.  HENT:  Normocephalic, atraumatic, External right and left ear normal.  EYES: Conjunctivae and EOM are normal. Pupils are equal, round, and reactive to light. No scleral icterus.  NECK: Normal range of motion, supple, no masses.  Normal thyroid.  SKIN: Skin is warm and dry. No rash noted. Not diaphoretic. No erythema. No pallor. MUSCULOSKELETAL: Normal range of motion. No tenderness.  No cyanosis, clubbing, or edema. NEUROLOGIC: Alert and oriented to person, place, and time. Normal reflexes, muscle tone coordination.  PSYCHIATRIC: Normal mood and affect. Normal behavior. Normal judgment and thought content. CARDIOVASCULAR: Normal heart rate noted, regular rhythm RESPIRATORY: Clear to auscultation bilaterally. Effort and breath sounds normal, no problems with respiration noted. BREASTS: Symmetric in size. No masses, tenderness, skin changes, nipple drainage, or lymphadenopathy bilaterally. Existing hives across sternum. Performed in the presence of a chaperone. ABDOMEN: Soft, no  distention noted.  No tenderness, rebound or guarding.  PELVIC: Normal appearing external genitalia and urethral meatus; normal appearing vaginal mucosa and cervix.  Scant abnormal vaginal discharge noted.  Pap smear obtained.  Performed in the presence of a chaperone.   Assessment and Plan:    1. Well woman exam with routine gynecological exam - No atypical findings on physical exam - Hives managed by Derm - Cytology - PAP  2. Fibroids - Known diagnosis, possible contributing factor to abdominal and pelvic "burning" - US PELVIC COMPLETE WITH TRANSVAGINAL; Future  3. Vaginal discharge  - Cervicovaginal ancillary only( [place])  4. Menopausal vaginal dryness - Can initiate OTC water-based lubricant - 2/2 low libido per patient report  5. History of genital warts - Rx prescribed s/p consult with Dr. Harolyn Rutherford  Will follow up results of pap smear and manage accordingly. Mammogram managed/scheduled by PCP Routine preventative health maintenance measures emphasized. Please refer to After Visit Summary for other counseling recommendations.      Mallie Snooks, [place], MSN, CNM Certified Nurse Midwife, Product/process development scientist for [employer], [place]

## 2022-07-12 NOTE — Telephone Encounter (Signed)
Gastroenterology Pre-Procedure Review  Request Date: 07/27/22 Requesting Physician: Dr. Marius Ditch  PATIENT REVIEW QUESTIONS: The patient responded to the following health history questions as indicated:    1. Are you having any GI issues? no 2. Do you have a personal history of Polyps? no 3. Do you have a family history of Colon Cancer or Polyps? yes (father polyps) 4. Diabetes Mellitus? no 5. Joint replacements in the past 12 months?no 6. Major health problems in the past 3 months?no 7. Any artificial heart valves, MVP, or defibrillator?no    MEDICATIONS & ALLERGIES:    Patient reports the following regarding taking any anticoagulation/antiplatelet therapy:   Plavix, Coumadin, Eliquis, Xarelto, Lovenox, Pradaxa, Brilinta, or Effient? no Aspirin? no  Patient confirms/reports the following medications:  Current Outpatient Medications  Medication Sig Dispense Refill   amLODipine (NORVASC) 2.5 MG tablet Take 1 tablet (2.5 mg total) by mouth daily. 30 tablet 9   ascorbic acid (VITAMIN C) 500 MG tablet Take 1 tablet (500 mg total) by mouth daily.     Calcium Magnesium Zinc 333-133-5 MG TABS Take 1 tablet by mouth daily.     Cholecalciferol (VITAMIN D3) 1.25 MG (50000 UT) TABS Take 1 tablet by mouth once a week. 12 tablet 3   cyanocobalamin (,VITAMIN B-12,) 1000 MCG/ML injection Inject 1 mL (1,000 mcg total) into the muscle every 30 (thirty) days. 3 mL 2   famotidine (PEPCID) 20 MG tablet Take 1 tablet (20 mg total) by mouth 2 (two) times daily. 180 tablet 1   ferrous sulfate (SLOW RELEASE IRON) 160 (50 Fe) MG TBCR SR tablet Take 1 tablet (160 mg total) by mouth daily.     fexofenadine (ALLEGRA ALLERGY) 180 MG tablet Take 1 tablet (180 mg total) by mouth in the morning and at bedtime.     hydrOXYzine (ATARAX) 10 MG tablet Take 1 tablet (10 mg total) by mouth daily as needed for itching (hives). 90 tablet 2   Imiquimod 3.75 % CREA Apply to affected area 2-3 times weekly for 6 weeks 7.5 g 0    Iron-Vitamins (GERITOL PO) Take 1 tablet by mouth daily.     LORazepam (ATIVAN) 0.5 MG tablet TAKE 1 TABLET BY MOUTH TWICE A DAY AS NEEDED FOR ANXIETY Strength: 0.5 mg 30 tablet 0   methylphenidate (CONCERTA) 18 MG PO CR tablet Take 1 tablet (18 mg total) by mouth daily. 30 tablet 0   metoprolol succinate (TOPROL-XL) 50 MG 24 hr tablet Take 1 tablet (50 mg total) by mouth daily. Take with or immediately following a meal. 90 tablet 2   montelukast (SINGULAIR) 10 MG tablet Take 1 tablet (10 mg total) by mouth at bedtime. 30 tablet 11   Multiple Vitamin (MULTIVITAMIN) tablet Take 1 tablet by mouth daily.     Syringe/Needle, Disp, (SYRINGE 3CC/25GX1") 25G X 1" 3 ML MISC Use with B12 injection once monthly 1 each 6   thiamine 100 MG tablet Take 1 tablet (100 mg total) by mouth daily.     triamcinolone (KENALOG) 0.025 % ointment Apply 1 Application topically 2 (two) times daily. 30 g 0   XOLAIR 150 MG/ML prefilled syringe Inject into the skin.     No current facility-administered medications for this visit.    Patient confirms/reports the following allergies:  Allergies  Allergen Reactions   Focalin [Dexmethylphenidate] Hives and Swelling    Does ok on Concerta   Sertraline Hives   Sulfa Antibiotics     Rash on occasion    No orders  of the defined types were placed in this encounter.   AUTHORIZATION INFORMATION Primary Insurance: 1D#: Group #:  Secondary Insurance: 1D#: Group #:  SCHEDULE INFORMATION: Date: 07/27/22 Time: Location: [facility]

## 2022-07-12 NOTE — Addendum Note (Signed)
Addended by: Vanetta Mulders on: 07/12/2022 10:38 AM   Modules accepted: Orders

## 2022-07-13 LAB — CERVICOVAGINAL ANCILLARY ONLY
Bacterial Vaginitis (gardnerella): NEGATIVE
Candida Glabrata: NEGATIVE
Candida Vaginitis: POSITIVE — AB
Comment: NEGATIVE
Comment: NEGATIVE
Comment: NEGATIVE

## 2022-07-18 ENCOUNTER — Ambulatory Visit
Admission: RE | Admit: 2022-07-18 | Discharge: 2022-07-18 | Disposition: A | Discharge status: Home / Self Care | Payer: BC Managed Care – PPO | Source: Ambulatory Visit | Attending: Advanced Practice Midwife | Admitting: Advanced Practice Midwife

## 2022-07-18 ENCOUNTER — Telehealth: Payer: Self-pay | Admitting: *Deleted

## 2022-07-18 ENCOUNTER — Other Ambulatory Visit: Payer: Self-pay

## 2022-07-18 DIAGNOSIS — D219 Benign neoplasm of connective and other soft tissue, unspecified: Secondary | ICD-10-CM | POA: Insufficient documentation

## 2022-07-18 DIAGNOSIS — B379 Candidiasis, unspecified: Secondary | ICD-10-CM

## 2022-07-18 MED ORDER — FLUCONAZOLE 150 MG PO TABS: 150.0000 mg | ORAL_TABLET | Freq: Once | ORAL | 3 refills | Status: AC

## 2022-07-18 NOTE — Progress Notes (Signed)
Rx for Diflucan sent as advised in result notes. Pt showed up in office regarding results. Made aware pap results are not back yet.

## 2022-07-18 NOTE — Telephone Encounter (Signed)
-----   Message from Darlina Rumpf, [place] sent at 07/15/2022 10:44 AM EST ----- Regarding: Yeast follow-up Yeast on swab. She can either treat with OTC Monistat or I'm happy to prescribe Diflucan. Thanks! SW ----- Message ----- From: Interface, [facility] [place] [facility] Sent: 07/13/2022  11:22 AM EST To: Darlina Rumpf, CNM

## 2022-07-18 NOTE — Telephone Encounter (Signed)
Left message for pt to call back regarding her results

## 2022-07-19 LAB — CYTOLOGY - PAP
Chlamydia: NEGATIVE
Comment: NEGATIVE
Comment: NEGATIVE
Comment: NEGATIVE
Comment: NORMAL
Diagnosis: NEGATIVE
High risk HPV: NEGATIVE
Neisseria Gonorrhea: NEGATIVE
Trichomonas: NEGATIVE

## 2022-07-25 ENCOUNTER — Telehealth: Payer: Self-pay

## 2022-07-25 NOTE — Telephone Encounter (Signed)
Pt call has been returned to r/s colonoscopy with Dr. Marius Ditch.  Colonoscopy has been r/s to 02/09.  Vikki in Endo notified.  Thanks, [place], [place]

## 2022-08-24 ENCOUNTER — Ambulatory Visit: Payer: BC Managed Care – PPO | Admitting: Anesthesiology

## 2022-08-24 ENCOUNTER — Ambulatory Visit
Admission: RE | Admit: 2022-08-24 | Discharge: 2022-08-24 | Disposition: A | Discharge status: Home / Self Care | Payer: BC Managed Care – PPO | Attending: Gastroenterology | Admitting: Gastroenterology

## 2022-08-24 ENCOUNTER — Encounter: Payer: Self-pay | Admitting: Gastroenterology

## 2022-08-24 ENCOUNTER — Encounter
Admission: RE | Disposition: A | Discharge status: Home / Self Care | Payer: Self-pay | Source: Home / Self Care | Attending: Gastroenterology

## 2022-08-24 DIAGNOSIS — I10 Essential (primary) hypertension: Secondary | ICD-10-CM | POA: Insufficient documentation

## 2022-08-24 DIAGNOSIS — Z6839 Body mass index (BMI) 39.0-39.9, adult: Secondary | ICD-10-CM | POA: Insufficient documentation

## 2022-08-24 DIAGNOSIS — F418 Other specified anxiety disorders: Secondary | ICD-10-CM | POA: Insufficient documentation

## 2022-08-24 DIAGNOSIS — Z1211 Encounter for screening for malignant neoplasm of colon: Secondary | ICD-10-CM

## 2022-08-24 DIAGNOSIS — D120 Benign neoplasm of cecum: Secondary | ICD-10-CM | POA: Diagnosis not present

## 2022-08-24 DIAGNOSIS — D649 Anemia, unspecified: Secondary | ICD-10-CM | POA: Diagnosis not present

## 2022-08-24 DIAGNOSIS — D759 Disease of blood and blood-forming organs, unspecified: Secondary | ICD-10-CM | POA: Insufficient documentation

## 2022-08-24 DIAGNOSIS — K219 Gastro-esophageal reflux disease without esophagitis: Secondary | ICD-10-CM | POA: Diagnosis not present

## 2022-08-24 DIAGNOSIS — K635 Polyp of colon: Secondary | ICD-10-CM

## 2022-08-24 DIAGNOSIS — E119 Type 2 diabetes mellitus without complications: Secondary | ICD-10-CM | POA: Diagnosis not present

## 2022-08-24 HISTORY — PX: COLONOSCOPY WITH PROPOFOL: SHX5780

## 2022-08-24 LAB — POCT PREGNANCY, URINE: Preg Test, Ur: NEGATIVE

## 2022-08-24 LAB — GLUCOSE, CAPILLARY: Glucose-Capillary: 106 mg/dL — ABNORMAL HIGH (ref 70–99)

## 2022-08-24 SURGERY — COLONOSCOPY WITH PROPOFOL
Anesthesia: General

## 2022-08-24 MED ORDER — LIDOCAINE HCL (CARDIAC) PF 100 MG/5ML IV SOSY
PREFILLED_SYRINGE | INTRAVENOUS | Status: DC | PRN
  Administered 2022-08-24: 50 mg via INTRAVENOUS

## 2022-08-24 MED ORDER — SODIUM CHLORIDE 0.9 % IV SOLN
INTRAVENOUS | Status: DC
  Administered 2022-08-24: 1000 mL via INTRAVENOUS

## 2022-08-24 MED ORDER — PROPOFOL 500 MG/50ML IV EMUL
INTRAVENOUS | Status: DC | PRN
  Administered 2022-08-24: 150 ug/kg/min via INTRAVENOUS

## 2022-08-24 MED ORDER — PROPOFOL 10 MG/ML IV BOLUS
INTRAVENOUS | Status: DC | PRN
  Administered 2022-08-24: 100 mg via INTRAVENOUS

## 2022-08-24 NOTE — Anesthesia Preprocedure Evaluation (Signed)
Anesthesia Evaluation  Patient identified by MRN, date of birth, ID band Patient awake    Reviewed: Allergy & Precautions, NPO status , Patient's Chart, lab work & pertinent test results  History of Anesthesia Complications (+) PONV and history of anesthetic complications  Airway Mallampati: III  TM Distance: >3 FB Neck ROM: full    Dental  (+) Teeth Intact   Pulmonary neg pulmonary ROS   Pulmonary exam normal breath sounds clear to auscultation       Cardiovascular Exercise Tolerance: Good hypertension, Pt. on medications negative cardio ROS Normal cardiovascular exam Rhythm:Regular     Neuro/Psych   Anxiety Depression    negative neurological ROS  negative psych ROS   GI/Hepatic negative GI ROS, Neg liver ROS,GERD  Medicated,,  Endo/Other  negative endocrine ROSdiabetes, Type 2  Morbid obesity  Renal/GU negative Renal ROS  negative genitourinary   Musculoskeletal   Abdominal  (+) + obese  Peds negative pediatric ROS (+)  Hematology negative hematology ROS (+) Blood dyscrasia, anemia   Anesthesia Other Findings Past Medical History: No date: ADHD, adult residual type No date: Allergic rhinitis     Comment:  post gastric bypass No date: Allergy 12/22/2021: Allergy status to other drugs, medicaments and biological  substances No date: Anxiety No date: Arthropathy     Comment:  weight bearing (pre gastric bypass) No date: Chronic urticaria No date: Diarrhea, functional No date: DM (diabetes mellitus) ([place])     Comment:  pre gastric bypass, pt states she has not been treated               for it in years, her doctor doesn't discuss it with her               anymore No date: GERD (gastroesophageal reflux disease)     Comment:  post gastric bypass No date: Heart murmur     Comment:  had it in the past No date: History of chicken pox 02/21/2017: History of open reduction and internal fixation (ORIF)   procedure No date: HTN (hypertension)     Comment:  Pre gastric bypass No date: HTN (hypertension)     Comment:  post gastric bypass No date: IDA (iron deficiency anemia)     Comment:  post gastric bypass (iron infusion by hematology-doesn't              build stores -Dr. 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[facility]) 02/2017: ORIF ANKLE FRACTURE; Left     Comment:  L lat malleolar fracture Erlinda Hong) 02/21/2017: ORIF ANKLE FRACTURE; Left     Comment:  Procedure: OPEN REDUCTION INTERNAL FIXATION (ORIF) LEFT               LATERAL MALLEOLUS ANKLE FRACTURE;  Surgeon: Leandrew Koyanagi, MD;  Location: [facility];  Service: Orthopedics;  Laterality: Left; 2006: rectal condyloma removal  BMI    Body Mass Index: 39.35 kg/m      Reproductive/Obstetrics negative OB ROS                             Anesthesia Physical Anesthesia Plan  ASA: 3  Anesthesia Plan: General   Post-op Pain Management:    Induction: Intravenous  PONV Risk Score and Plan: Propofol infusion and TIVA  Airway Management Planned: Natural Airway  Additional Equipment:   Intra-op Plan:   Post-operative Plan:   Informed Consent: I have reviewed the patients History and Physical, chart, labs and discussed the procedure including the risks, benefits and alternatives for the proposed anesthesia with the patient or authorized representative who has indicated his/her understanding and  acceptance.     Dental Advisory Given  Plan Discussed with: CRNA and Surgeon  Anesthesia Plan Comments:        Anesthesia Quick Evaluation

## 2022-08-24 NOTE — Transfer of Care (Signed)
Immediate Anesthesia Transfer of Care Note  Patient: Theresa Pratt  Procedure(s) Performed: COLONOSCOPY WITH PROPOFOL  Patient Location: PACU  Anesthesia Type:General  Level of Consciousness: awake, alert , and oriented  Airway & Oxygen Therapy: Patient Spontanous Breathing  Post-op Assessment: Report given to RN and Post -op Vital signs reviewed and stable  Post vital signs: Reviewed and stable  Last Vitals:  Vitals Value Taken Time  BP    Temp    Pulse    Resp    SpO2      Last Pain:  Vitals:   08/24/22 0842  TempSrc: Temporal  PainSc: 0-No pain         Complications: No notable events documented.

## 2022-08-24 NOTE — Op Note (Signed)
[facility] Gastroenterology Patient Name: Theresa Pratt Procedure Date: 08/24/2022 8:57 AM MRN: [id number] Account #: [id number] Date of Birth: 10-23-1970 Admit Type: Outpatient Age: 52 Room: [facility] ENDO ROOM 2 Gender: Female Note Status: Finalized Instrument Name: Jasper Riling [id number] Procedure:             Colonoscopy Indications:           Screening for colorectal malignant neoplasm, This is                         the patient's first colonoscopy Providers:             Lin Landsman MD, MD Referring MD:          [name] (Referring MD) Medicines:             General Anesthesia Complications:         No immediate complications. Estimated blood loss: None. Procedure:             Pre-Anesthesia Assessment:                        - Prior to the procedure, a History and Physical was                         performed, and patient medications and allergies were                         reviewed. The patient is competent. The risks and                         benefits of the procedure and the sedation options and                         risks were discussed with the patient. All questions                         were answered and informed consent was obtained.                         Patient identification and proposed procedure were                         verified by the physician, the nurse, the                         anesthesiologist, the anesthetist and the technician                         in the pre-procedure area in the procedure room in the                         endoscopy suite. Mental Status Examination: alert and                         oriented. Airway Examination: normal oropharyngeal                         airway and neck mobility. Respiratory Examination:  clear to auscultation. CV Examination: normal.                         Prophylactic Antibiotics: The patient does not require                          prophylactic antibiotics. Prior Anticoagulants: The                         patient has taken no anticoagulant or antiplatelet                         agents. ASA Grade Assessment: III - A patient with                         severe systemic disease. After reviewing the risks and                         benefits, the patient was deemed in satisfactory                         condition to undergo the procedure. The anesthesia                         plan was to use general anesthesia. Immediately prior                         to administration of medications, the patient was                         re-assessed for adequacy to receive sedatives. The                         heart rate, respiratory rate, oxygen saturations,                         blood pressure, adequacy of pulmonary ventilation, and                         response to care were monitored throughout the                         procedure. The physical status of the patient was                         re-assessed after the procedure.                        After obtaining informed consent, the colonoscope was                         passed under direct vision. Throughout the procedure,                         the patient's blood pressure, pulse, and oxygen                         saturations were monitored continuously. The  Colonoscope was introduced through the anus and                         advanced to the the cecum, identified by appendiceal                         orifice and ileocecal valve. The colonoscopy was                         performed without difficulty. The patient tolerated                         the procedure well. The quality of the bowel                         preparation was evaluated using the BBPS [facility] Bowel                         Preparation Scale) with scores of: Right Colon = 3,                         Transverse Colon = 3 and Left Colon = 3 (entire mucosa                          seen well with no residual staining, small fragments                         of stool or opaque liquid). The total BBPS score                         equals 9. The ileocecal valve, appendiceal orifice,                         and rectum were photographed. Findings:      The perianal and digital rectal examinations were normal. Pertinent       negatives include normal sphincter tone and no palpable rectal lesions.      Two sessile polyps were found in the cecum. The polyps were diminutive       in size. These polyps were removed with a jumbo cold forceps. Resection       and retrieval were complete. Estimated blood loss: none.      The retroflexed view of the distal rectum and anal verge was normal and       showed no anal or rectal abnormalities.      The exam was otherwise without abnormality. Impression:            - Two diminutive polyps in the cecum, removed with a                         jumbo cold forceps. Resected and retrieved.                        - The distal rectum and anal verge are normal on                         retroflexion view.                        -  The examination was otherwise normal. Recommendation:        - Discharge patient to home (with escort).                        - Resume previous diet today.                        - Continue present medications.                        - Await pathology results.                        - Repeat colonoscopy in 7-10 years for surveillance                         based on pathology results. Procedure Code(s):     --- Professional ---                        [phone], Colonoscopy, flexible; with biopsy, single or                         multiple Diagnosis Code(s):     --- Professional ---                        Z12.11, Encounter for screening for malignant neoplasm                         of colon                        D12.0, Benign neoplasm of cecum CPT copyright 2022 American Medical Association. All rights  reserved. The codes documented in this report are preliminary and upon coder review may  be revised to meet current compliance requirements. Dr. Ulyess Mort Lin Landsman MD, MD 08/24/2022 9:25:11 AM This report has been signed electronically. Number of Addenda: 0 Note Initiated On: 08/24/2022 8:57 AM Scope Withdrawal Time: 0 hours 13 minutes 53 seconds  Total Procedure Duration: 0 hours 17 minutes 2 seconds  Estimated Blood Loss:  Estimated blood loss: none.      [facility]

## 2022-08-24 NOTE — Anesthesia Postprocedure Evaluation (Signed)
Anesthesia Post Note  Patient: Theresa Pratt  Procedure(s) Performed: COLONOSCOPY WITH PROPOFOL  Patient location during evaluation: PACU Anesthesia Type: General Level of consciousness: awake and alert Pain management: satisfactory to patient Vital Signs Assessment: post-procedure vital signs reviewed and stable Respiratory status: spontaneous breathing and nonlabored ventilation Cardiovascular status: stable Anesthetic complications: no  No notable events documented.   Last Vitals:  Vitals:   08/24/22 0842  BP: (!) 155/104  Pulse: 72  Resp: 18  Temp: (!) 35.8 C  SpO2: 100%    Last Pain:  Vitals:   08/24/22 0842  TempSrc: Temporal  PainSc: 0-No pain                 VAN STAVEREN,Lanae Federer
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
## 2022-08-27 ENCOUNTER — Encounter: Payer: Self-pay | Admitting: Gastroenterology

## 2022-08-27 LAB — SURGICAL PATHOLOGY

## 2022-08-28 ENCOUNTER — Encounter: Payer: Self-pay | Admitting: Gastroenterology

## 2022-08-30 ENCOUNTER — Telehealth: Payer: Self-pay | Admitting: Family Medicine

## 2022-08-30 DIAGNOSIS — F902 Attention-deficit hyperactivity disorder, combined type: Secondary | ICD-10-CM

## 2022-08-30 MED ORDER — LORAZEPAM 0.5 MG PO TABS: ORAL_TABLET | ORAL | 0 refills | Status: DC

## 2022-08-30 MED ORDER — METHYLPHENIDATE HCL ER (OSM) 18 MG PO TBCR: 18.0000 mg | EXTENDED_RELEASE_TABLET | Freq: Every day | ORAL | 0 refills | Status: DC

## 2022-08-30 NOTE — Addendum Note (Signed)
Addended by: [name] on: 08/30/2022 11:00 AM   Modules accepted: Orders

## 2022-08-30 NOTE — Telephone Encounter (Signed)
ERx 

## 2022-08-30 NOTE — Telephone Encounter (Signed)
Name of Medication: Lorazepam, Concerta Name of Pharmacy: [facility] Last Fill or Written Date and Quantity:       Lorazepam- [id number], [id number]      Concerta- [id number], [id number] Last Office Visit and Type: 07/10/22, CPE Next Office Visit and Type: 10/10/22, 3 mo wt mgmt f/u Last Controlled Substance Agreement Date: 10/30/17 Last UDS: 10/30/17

## 2022-08-30 NOTE — Telephone Encounter (Signed)
Prescription Request  08/30/2022  Is this a "Controlled Substance" medicine? No  LOV: 07/10/2022  What is the name of the medication or equipment? LORazepam (ATIVAN) 0.5 MG tablet   methylphenidate (CONCERTA) 18 MG PO CR tablet  Have you contacted your pharmacy to request a refill? No   Which pharmacy would you like this sent to?  [id number] [place] [facility] [address] [place] [address]Phone: [phone]: [phone]   Patient notified that their request is being sent to the clinical staff for review and that they should receive a response within 2 business days.   Please advise at Mobile [phone](mobile)

## 2022-09-02 ENCOUNTER — Encounter: Payer: Self-pay | Admitting: Family Medicine

## 2022-09-05 ENCOUNTER — Encounter: Payer: Self-pay | Admitting: Family Medicine

## 2022-09-05 ENCOUNTER — Ambulatory Visit (INDEPENDENT_AMBULATORY_CARE_PROVIDER_SITE_OTHER): Payer: BC Managed Care – PPO | Admitting: Family Medicine

## 2022-09-05 VITALS — BP 130/86 | HR 92 | Temp 97.8°F | Ht 65.5 in | Wt 241.5 lb

## 2022-09-05 DIAGNOSIS — L508 Other urticaria: Secondary | ICD-10-CM

## 2022-09-05 MED ORDER — PREDNISONE 20 MG PO TABS: 20.0000 mg | ORAL_TABLET | Freq: Every day | ORAL | 0 refills | Status: AC

## 2022-09-05 NOTE — Progress Notes (Signed)
Sanita Estrada T. Rathana Viveros, MD, [place] at [facility] [place], [address]  Phone: [phone]  FAX: Ernest - 52 y.o. female  MRN [id number]  Date of Birth: [birth year]  Date: 09/05/2022  PCP: [name], MD  Referral: [name], MD  Chief Complaint  Patient presents with   Urticaria    Due to chronic stress   Subjective:   Theresa Pratt is a 52 y.o. very pleasant female patient with Body mass index is 39.58 kg/m. who presents with the following:  Saw Allergy 2 weeks ago for evaluation of chronic urticaria.   Has tried Zyrtec and Atarax.  After her initial allergy visit, she was started on Zyrtec 20 mg and Zantac 150 mg BID.   - At follow-up allergy appointment, she was started on Singulair 10 mg and hydroxyzine 10 mg at night.   Ultimately in 2018 she was given a few rounds of prednisone, and was started on Cyclosporin and then this was d/c in favor of Xolair.   Late 2023 she was on Xolair 300 mg q 14 days and Cyclosporin. 50 mg bid.   - now on 4 allegra each day and cyclosporin bid, as well.   Hoarse, itching everywhere.   When you touch it it feels like a burn.  Will take some oatmeal baths.  Started on 2022/10/05.   Had a close death, very stressed.  Daughter spoke at church o Sunday, and she was trying to call Allergy all day.     Review of Systems is noted in the HPI, as appropriate  Objective:   BP 130/86   Pulse 92   Temp 97.8 F (36.6 C) (Temporal)   Ht 5' 5.5" (1.664 m)   Wt 241 lb 8 oz (109.5 kg)   SpO2 99%   BMI 39.58 kg/m   GEN: No acute distress; alert,appropriate. PULM: Breathing comfortably in no respiratory distress PSYCH: Normally interactive.  Will for areas of urticaria face, arms, torso  Laboratory and Imaging Data:  Assessment and Plan:     ICD-10-CM   1. Recurrent urticaria  L50.8      Acute on chronic urticaria with  exacerbation.  The patient is already on an extremely complicated urticaria protocol from allergy from [facility], and she is still having in very intense urticarial flare right now.  I am going to place the patient on some oral steroids.  Medication Management during today's office visit: Meds ordered this encounter  Medications   predniSONE (DELTASONE) 20 MG tablet    Sig: Take 1 tablet (20 mg total) by mouth daily with breakfast for 7 days.    Dispense:  7 tablet    Refill:  0   There are no discontinued medications.  Orders placed today for conditions managed today: No orders of the defined types were placed in this encounter.   Disposition: No follow-ups on file.  Dragon [employer] One speech-to-text software was used for transcription in this dictation.  Possible transcriptional errors can occur using Editor, commissioning.   Signed,  Maud Deed. Muhannad Bignell, MD   Outpatient Encounter Medications as of 09/05/2022  Medication Sig   amLODipine (NORVASC) 2.5 MG tablet Take 1 tablet (2.5 mg total) by mouth daily.   ascorbic acid (VITAMIN C) 500 MG tablet Take 1 tablet (500 mg total) by mouth daily.   Calcium Magnesium Zinc 333-133-5 MG TABS Take 1 tablet by mouth daily.   Cholecalciferol (  VITAMIN D3) 1.25 MG (50000 UT) TABS Take 1 tablet by mouth once a week.   cyanocobalamin (,VITAMIN B-12,) 1000 MCG/ML injection Inject 1 mL (1,000 mcg total) into the muscle every 30 (thirty) days.   cycloSPORINE modified (NEORAL) 50 MG capsule Take 50 mg by mouth 2 (two) times daily.   famotidine (PEPCID) 20 MG tablet Take 1 tablet (20 mg total) by mouth 2 (two) times daily.   ferrous sulfate (SLOW RELEASE IRON) 160 (50 Fe) MG TBCR SR tablet Take 1 tablet (160 mg total) by mouth daily.   fexofenadine (ALLEGRA ALLERGY) 180 MG tablet Take 1 tablet (180 mg total) by mouth in the morning and at bedtime.   hydrOXYzine (ATARAX) 10 MG tablet Take 1 tablet (10 mg total) by mouth daily as needed for  itching (hives).   Imiquimod 3.75 % CREA Apply to affected area 2-3 times weekly for 6 weeks   Iron-Vitamins (GERITOL PO) Take 1 tablet by mouth daily.   LORazepam (ATIVAN) 0.5 MG tablet TAKE 1 TABLET BY MOUTH TWICE A DAY AS NEEDED FOR ANXIETY Strength: 0.5 mg   methylphenidate (CONCERTA) 18 MG PO CR tablet Take 1 tablet (18 mg total) by mouth daily.   metoprolol succinate (TOPROL-XL) 50 MG 24 hr tablet Take 1 tablet (50 mg total) by mouth daily. Take with or immediately following a meal.   montelukast (SINGULAIR) 10 MG tablet Take 1 tablet (10 mg total) by mouth at bedtime.   Multiple Vitamin (MULTIVITAMIN) tablet Take 1 tablet by mouth daily.   predniSONE (DELTASONE) 20 MG tablet Take 1 tablet (20 mg total) by mouth daily with breakfast for 7 days.   Syringe/Needle, Disp, (SYRINGE 3CC/25GX1") 25G X 1" 3 ML MISC Use with B12 injection once monthly   thiamine 100 MG tablet Take 1 tablet (100 mg total) by mouth daily.   triamcinolone (KENALOG) 0.025 % ointment Apply 1 Application topically 2 (two) times daily.   XOLAIR 150 MG/ML prefilled syringe Inject into the skin.   No facility-administered encounter medications on file as of 09/05/2022.

## 2022-09-06 ENCOUNTER — Ambulatory Visit: Payer: BC Managed Care – PPO | Admitting: Family Medicine

## 2022-10-06 ENCOUNTER — Encounter: Payer: Self-pay | Admitting: Emergency Medicine

## 2022-10-06 ENCOUNTER — Ambulatory Visit
Admission: EM | Admit: 2022-10-06 | Discharge: 2022-10-06 | Disposition: A | Discharge status: Home / Self Care | Payer: BC Managed Care – PPO

## 2022-10-06 ENCOUNTER — Other Ambulatory Visit: Payer: Self-pay

## 2022-10-06 DIAGNOSIS — L500 Allergic urticaria: Secondary | ICD-10-CM

## 2022-10-06 MED ORDER — PREDNISONE 20 MG PO TABS: 40.0000 mg | ORAL_TABLET | Freq: Every day | ORAL | 0 refills | Status: AC

## 2022-10-06 NOTE — ED Provider Notes (Signed)
[facility]    CSN: [id number] Arrival date & time: 10/06/22  1535      History   Chief Complaint Chief Complaint  Patient presents with   Urticaria    HPI Theresa Pratt is a 52 y.o. female.   HPI  52 year old female with a past medical history significant for hypertension, IDA, GERD, diabetes, ADHD, heart murmur, and allergies presenting for evaluation of hives that started yesterday.  She states that this is a recurrent condition and typically needs low-dose prednisone to bridge her until her next Xolair injection.  Past Medical History:  Diagnosis Date   ADHD, adult residual type    Allergic rhinitis    post gastric bypass   Allergy    Allergy status to other drugs, medicaments and biological substances 12/22/2021   Anxiety    Arthropathy    weight bearing (pre gastric bypass)   Chronic urticaria    Diarrhea, functional    DM (diabetes mellitus) ([place])    pre gastric bypass, pt states she has not been treated for it in years, her doctor doesn't discuss it with her anymore   GERD (gastroesophageal reflux disease)    post gastric bypass   Heart murmur    had it in the past   History of chicken pox    History of open reduction and internal fixation (ORIF) procedure 02/21/2017   HTN (hypertension)    Pre gastric bypass   HTN (hypertension)    post gastric bypass   IDA (iron deficiency anemia)    post gastric bypass (iron infusion by hematology-doesn't build stores -Dr. 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   Oligomenorrhea    on Implanon (pre gastric bypass)   PONV (postoperative nausea and vomiting)    after 1st surgery   Postpartum depression    post gastric bypass   Pre-eclampsia    post gastric bypass   Restless legs     Patient Active Problem List   Diagnosis Date Noted   Polyp of cecum 08/24/2022   Encounter for screening colonoscopy 08/24/2022   Encounter for general adult  medical examination with abnormal findings 07/10/2022   Immunization deficiency 06/08/2020   Epidermal cyst 04/26/2020   Thiamine deficiency 10/22/2019   Severe obesity (BMI 35.0-39.9) with comorbidity ([place]) 06/03/2018   Displaced fracture of lateral malleolus of left fibula, initial encounter for closed fracture 02/19/2017   Vitamin B12 deficiency 12/21/2016   Attention deficit hyperactivity disorder (ADHD), 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   Chronic idiopathic urticaria 03/05/2016   PTSD (post-traumatic stress disorder) 02/28/2016   Anxiety with depression 06/29/2015   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   Menorrhagia 07/04/2012   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   BACK PAIN, LUMBAR 07/14/2010   Vitamin D deficiency 07/03/2010   Allergic rhinitis 07/03/2010   GERD 07/03/2010   Essential hypertension 06/22/2010    Past Surgical History:  Procedure Laterality Date   CHOLECYSTECTOMY  08/2010   COLONOSCOPY WITH PROPOFOL N/A 08/24/2022   TAx1, rpt 7 yrs (Vanga, Tally Due, MD)   FRACTURE SURGERY     GASTRIC BYPASS  2003   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 Authorizing Provider  atomoxetine (STRATTERA) 40 MG capsule Take by mouth. 05/29/16  Yes [provider]  EPINEPHrine 0.3 mg/0.3 mL IJ SOAJ injection  04/16/22  Yes [provider]  predniSONE (DELTASONE) 20 MG tablet Take 2 tablets (40  mg total) by mouth daily with breakfast for 7 days. 3 tablets by mouth daily for 3 days, 2 tablets by mouth daily for 4 days, and 1 tablet by mouth daily for 5 days. 10/06/22 10/13/22 Yes Margarette Canada, NP  amLODipine (NORVASC) 2.5 MG tablet Take 1 tablet (2.5 mg total) by mouth daily. 07/10/22   [name], MD  ascorbic acid (VITAMIN C) 500 MG tablet Take 1 tablet (500 mg total) by mouth daily. 06/08/20   [name], MD  Calcium Magnesium Zinc 333-133-5 MG TABS Take 1 tablet by mouth daily. 06/08/20   [name], MD  Cholecalciferol (VITAMIN D3) 1.25 MG (50000 UT) TABS Take 1 tablet by mouth once a week. 07/10/22   [name], MD  cyanocobalamin (,VITAMIN B-12,) 1000 MCG/ML injection Inject 1 mL (1,000 mcg total) into the muscle every 30 (thirty) days. 10/10/21   [name], MD  cycloSPORINE (SANDIMMUNE) 25 MG capsule Take 50 mg by mouth 2 (two) times daily.    [provider]  cycloSPORINE modified (NEORAL) 50 MG capsule Take 50 mg by mouth 2 (two) times daily.    [provider]  famotidine (PEPCID) 20 MG tablet Take 1 tablet (20 mg total) by mouth 2 (two) times daily. 12/03/20   Coral Spikes, DO  ferrous sulfate (SLOW RELEASE IRON) 160 (50 Fe) MG TBCR SR tablet Take 1 tablet (160 mg total) by mouth daily. 07/10/22   [name], MD  fexofenadine [facility] ALLERGY) 180 MG tablet Take 1 tablet (180 mg total) by mouth in the morning and at bedtime. 05/04/22   [name], MD  hydrOXYzine (ATARAX) 10 MG tablet Take 1 tablet (10 mg total) by mouth daily as needed for itching (hives). 12/16/21   Margarette Canada, NP  Imiquimod 3.75 % CREA Apply to affected area 2-3 times weekly for 6 weeks 02/14/21   Woodroe Mode, MD  Iron-Vitamins (GERITOL PO) Take 1 tablet by mouth daily.    [provider]  LORazepam (ATIVAN) 0.5 MG tablet TAKE 1 TABLET BY MOUTH TWICE A DAY AS NEEDED FOR ANXIETY Strength: 0.5 mg 08/30/22   [name], MD   methylphenidate (CONCERTA) 18 MG PO CR tablet Take 1 tablet (18 mg total) by mouth daily. 08/30/22   [name], MD  metoprolol succinate (TOPROL-XL) 50 MG 24 hr tablet Take 1 tablet (50 mg total) by mouth daily. Take with or immediately following a meal. 05/04/22   [name], MD  montelukast (SINGULAIR) 10 MG tablet Take 1 tablet (10 mg total) by mouth at bedtime. 05/04/22   [name], MD  Multiple Vitamin (MULTIVITAMIN) tablet Take 1 tablet by mouth  daily.    [provider]  Syringe/Needle, Disp, (SYRINGE 3CC/25GX1") 25G X 1" 3 ML MISC Use with B12 injection once monthly 01/27/21   [name], MD  thiamine 100 MG tablet Take 1 tablet (100 mg total) by mouth daily. 11/03/19   [name], MD  triamcinolone (KENALOG) 0.025 % ointment Apply 1 Application topically 2 (two) times daily. 04/21/22   Margarette Canada, NP  Arvid Right 150 MG/ML prefilled syringe Inject into the skin. 03/22/22   [provider]    Family History Family History  Problem Relation Age of Onset   Coronary artery disease Father        2 stents; + smoker   Hypertension Father    Thyroid disease Brother    Diabetes Maternal Grandmother    Cirrhosis Paternal Grandmother    Alcohol abuse Paternal Grandmother    Breast cancer Other        Paternal great aunt(obesity, smoker, EtOHic)   Breast cancer Other        Maternal great aunt (deceased in 57's from same)   Thyroid disease Other        cousins and grandmother   Cancer Other        great aunt - lung   Liver disease Paternal Aunt    Cancer Cousin        liver, bile duct   Cancer Mother        uterine   Uterine cancer Mother    Colon polyps Neg Hx    Colon cancer Neg Hx    Esophageal cancer Neg Hx    Rectal cancer Neg Hx    Stomach cancer Neg Hx     Social History Social History   Tobacco Use   Smoking status: Never   Smokeless tobacco: Never  Vaping Use   Vaping Use: Never used  Substance Use Topics    Alcohol use: Yes    Comment: occasional   Drug use: No     Allergies   Focalin [dexmethylphenidate], Sertraline, and Sulfa antibiotics   Review of Systems Review of Systems  HENT:  Negative for sore throat and trouble swallowing.   Respiratory:  Positive for shortness of breath.   Skin:  Positive for rash.     Physical Exam Triage Vital Signs ED Triage Vitals  Enc Vitals Group     BP      Pulse      Resp      Temp      Temp src      SpO2      Weight      Height      Head Circumference      Peak Flow      Pain Score      Pain Loc      Pain Edu?      Excl. in [place]?    No data found.  Updated Vital Signs BP (!) 144/99 (BP Location: Right Arm)   Pulse 100   Temp 97.7 F (36.5 C) (Oral)   Resp 18   Ht 5\' 5"  (1.651 m)   Wt 240 lb (108.9 kg)   LMP 09/14/2022   SpO2 97%   BMI 39.94 kg/m   Visual Acuity Right Eye Distance:   Left Eye Distance:   Bilateral Distance:    Right Eye Near:   Left Eye Near:    Bilateral Near:     Physical Exam Vitals and nursing note reviewed.  Constitutional:  Appearance: Normal appearance.  Cardiovascular:     Rate and Rhythm: Normal rate and regular rhythm.     Pulses: Normal pulses.     Heart sounds: Normal heart sounds. No murmur heard.    No friction rub. No gallop.  Pulmonary:     Effort: Pulmonary effort is normal.     Breath sounds: Normal breath sounds. No wheezing, rhonchi or rales.  Skin:    General: Skin is warm and dry.     Capillary Refill: Capillary refill takes less than 2 seconds.     Findings: Rash present.  Neurological:     General: No focal deficit present.     Mental Status: She is alert and oriented to person, place, and time.      UC Treatments / Results  Labs (all labs ordered are listed, but only abnormal results are displayed) Labs Reviewed - No data to display  EKG   Radiology No results found.  Procedures Procedures (including critical care time)  Medications Ordered in  UC Medications - No data to display  Initial Impression / Assessment and Plan / UC Course  I have reviewed the triage vital signs and the nursing notes.  Pertinent labs & imaging results that were available during my care of the patient were reviewed by me and considered in my medical decision making (see chart for details).   Patient is a nontoxic-appearing 52 year old female who has a history of stress-induced hives and is on Xolair for same presenting for evaluation of 10 days worth of hives.  She reports that she began having hives after her husband was diagnosed with prostate cancer.  She has been taking cyclosporine, Pepcid, Zyrtec, and Singulair as prescribed.  She also has hydroxyzine but she has not had to take it.  She states she does have some shortness of breath with exertion but denies wheezing.  She states that typically in these cases she needs a short course of prednisone to bridge her until her next Xolair injection which will be in 5 days.  On exam she is able to speak in full sentence without dyspnea or tachypnea.  She does have scattered hives on her anterior chest and neck.  No stridor when auscultating over the trachea and her lung sounds are clear to auscultation all fields.  I will start the patient on 40 mg of prednisone daily for 7 days as a burst dose and have her continue her antihistamine therapy that she is already been prescribed.  Return precautions reviewed.   Final Clinical Impressions(s) / UC Diagnoses   Final diagnoses:  Allergic urticaria     Discharge Instructions      Continue to take your cyclosporine, Zyrtec, and Pepcid as directed.  Start taking the Prednisone 40 mg daily for 7 days.  Keep your Xolair injection as scheduled.  Return for any new or worsening symptoms.      ED Prescriptions     Medication Sig Dispense Auth. Provider   predniSONE (DELTASONE) 20 MG tablet Take 2 tablets (40 mg total) by mouth daily with breakfast for 7 days. 3  tablets by mouth daily for 3 days, 2 tablets by mouth daily for 4 days, and 1 tablet by mouth daily for 5 days. 14 tablet Margarette Canada, NP      PDMP not reviewed this encounter.   Margarette Canada, NP 10/06/22 [phone]

## 2022-10-06 NOTE — ED Triage Notes (Addendum)
Pt with recurrent hives/urticaria and started yesterday with same. Due to the other meds she is on she reports usually a low dose prednisone will get her through until her next Xolair tomorrow evening.

## 2022-10-06 NOTE — Discharge Instructions (Signed)
Continue to take your cyclosporine, Zyrtec, and Pepcid as directed.  Start taking the Prednisone 40 mg daily for 7 days.  Keep your Xolair injection as scheduled.  Return for any new or worsening symptoms.

## 2022-10-10 ENCOUNTER — Ambulatory Visit (INDEPENDENT_AMBULATORY_CARE_PROVIDER_SITE_OTHER): Payer: BC Managed Care – PPO | Admitting: Family Medicine

## 2022-10-10 ENCOUNTER — Encounter: Payer: Self-pay | Admitting: Family Medicine

## 2022-10-10 VITALS — BP 152/100 | HR 75 | Temp 97.7°F | Ht 65.0 in | Wt 239.0 lb

## 2022-10-10 DIAGNOSIS — I10 Essential (primary) hypertension: Secondary | ICD-10-CM

## 2022-10-10 DIAGNOSIS — R21 Rash and other nonspecific skin eruption: Secondary | ICD-10-CM | POA: Diagnosis not present

## 2022-10-10 DIAGNOSIS — F902 Attention-deficit hyperactivity disorder, combined type: Secondary | ICD-10-CM

## 2022-10-10 DIAGNOSIS — L501 Idiopathic urticaria: Secondary | ICD-10-CM

## 2022-10-10 DIAGNOSIS — E538 Deficiency of other specified B group vitamins: Secondary | ICD-10-CM

## 2022-10-10 DIAGNOSIS — N941 Unspecified dyspareunia: Secondary | ICD-10-CM

## 2022-10-10 NOTE — Progress Notes (Unsigned)
Patient ID: Theresa Pratt, female    DOB: Jun 13, 1971, 52 y.o.   MRN: [id number]  This visit was conducted in person.  BP (!) 152/100   Pulse 75   Temp 97.7 F (36.5 C) (Temporal)   Ht 5\' 5"  (1.651 m)   Wt 239 lb (108.4 kg)   LMP 09/14/2022   SpO2 97%   BMI 39.77 kg/m   BP Readings from Last 3 Encounters:  10/10/22 (!) 152/100  10/06/22 (!) 144/99  09/05/22 130/86   Elevated 160s/100s on repeat testing - she is on prednisone currently  CC: 3 mo f/u visit  Subjective:   HPI: Theresa Pratt is a 52 y.o. female presenting on 10/10/2022 for Medical Management of Chronic Issues (Here for 3 mo f/u.)   Recent [facility] visit for recurrent chronic idiopathic allergic urticaria, followed by [facility] allergist currently on allegra x4/day, cyclosporin bid and xolair. Recently treated with prednisone 40mg  1 wk course. Had swelling of upper back as well as hoarse voice.  She's fully cut out beef with benefit as well although she tested negative for alpha-gal.  Increased stress recently - husband recently diagnosed with favorable risk prostate cancer - rec active surveillance, they're going to [facility] for second opinion.  She had colonoscopy last month.  B12 - continues monthly injections at home.   BP elevated today - she forgot to take amlodipine and toprol XL for the past 3 days.   Overall has been feeling well.  Anxiety and depression - continues PRN lorazepam - sparing use. Last visit we referred to counselor - didn't see. She's been talking more closely to pastor and praying, meditating.  ADHD - chronic, stable period on concerta 18mg  - only takes daily as needed.   LMP 02/2022 - again this month had normal period. H/o fibroid, now with dyspareunia - she will contact GYN for evaluation.   She wonders about acute porphyria.       Relevant past medical, surgical, family and social history reviewed and updated as indicated. Interim medical history since our last visit  reviewed. Allergies and medications reviewed and updated. Outpatient Medications Prior to Visit  Medication Sig Dispense Refill   amLODipine (NORVASC) 2.5 MG tablet Take 1 tablet (2.5 mg total) by mouth daily. 30 tablet 9   ascorbic acid (VITAMIN C) 500 MG tablet Take 1 tablet (500 mg total) by mouth daily.     atomoxetine (STRATTERA) 40 MG capsule Take by mouth.     Calcium Magnesium Zinc 333-133-5 MG TABS Take 1 tablet by mouth daily.     Cholecalciferol (VITAMIN D3) 1.25 MG (50000 UT) TABS Take 1 tablet by mouth once a week. 12 tablet 3   cyanocobalamin (,VITAMIN B-12,) 1000 MCG/ML injection Inject 1 mL (1,000 mcg total) into the muscle every 30 (thirty) days. 3 mL 2   cycloSPORINE (SANDIMMUNE) 25 MG capsule Take 50 mg by mouth 2 (two) times daily.     cycloSPORINE modified (NEORAL) 50 MG capsule Take 50 mg by mouth 2 (two) times daily.     EPINEPHrine 0.3 mg/0.3 mL IJ SOAJ injection      famotidine (PEPCID) 20 MG tablet Take 1 tablet (20 mg total) by mouth 2 (two) times daily. 180 tablet 1   ferrous sulfate (SLOW RELEASE IRON) 160 (50 Fe) MG TBCR SR tablet Take 1 tablet (160 mg total) by mouth daily.     fexofenadine (ALLEGRA ALLERGY) 180 MG tablet Take 1 tablet (180 mg total) by mouth in the  morning and at bedtime.     hydrOXYzine (ATARAX) 10 MG tablet Take 1 tablet (10 mg total) by mouth daily as needed for itching (hives). 90 tablet 2   Imiquimod 3.75 % CREA Apply to affected area 2-3 times weekly for 6 weeks 7.5 g 0   Iron-Vitamins (GERITOL PO) Take 1 tablet by mouth daily.     LORazepam (ATIVAN) 0.5 MG tablet TAKE 1 TABLET BY MOUTH TWICE A DAY AS NEEDED FOR ANXIETY Strength: 0.5 mg 30 tablet 0   methylphenidate (CONCERTA) 18 MG PO CR tablet Take 1 tablet (18 mg total) by mouth daily. 30 tablet 0   metoprolol succinate (TOPROL-XL) 50 MG 24 hr tablet Take 1 tablet (50 mg total) by mouth daily. Take with or immediately following a meal. 90 tablet 2   montelukast (SINGULAIR) 10 MG tablet  Take 1 tablet (10 mg total) by mouth at bedtime. 30 tablet 11   Multiple Vitamin (MULTIVITAMIN) tablet Take 1 tablet by mouth daily.     predniSONE (DELTASONE) 20 MG tablet Take 2 tablets (40 mg total) by mouth daily with breakfast for 7 days. 3 tablets by mouth daily for 3 days, 2 tablets by mouth daily for 4 days, and 1 tablet by mouth daily for 5 days. 14 tablet 0   Syringe/Needle, Disp, (SYRINGE 3CC/25GX1") 25G X 1" 3 ML MISC Use with B12 injection once monthly 1 each 6   thiamine 100 MG tablet Take 1 tablet (100 mg total) by mouth daily.     triamcinolone (KENALOG) 0.025 % ointment Apply 1 Application topically 2 (two) times daily. 30 g 0   XOLAIR 150 MG/ML prefilled syringe Inject into the skin.     No facility-administered medications prior to visit.     Per HPI unless specifically indicated in ROS section below Review of Systems  Objective:  BP (!) 152/100   Pulse 75   Temp 97.7 F (36.5 C) (Temporal)   Ht 5\' 5"  (1.651 m)   Wt 239 lb (108.4 kg)   LMP 09/14/2022   SpO2 97%   BMI 39.77 kg/m   Wt Readings from Last 3 Encounters:  10/10/22 239 lb (108.4 kg)  10/06/22 240 lb (108.9 kg)  09/05/22 241 lb 8 oz (109.5 kg)      Physical Exam Vitals and nursing note reviewed.  Constitutional:      Appearance: Normal appearance. She is not ill-appearing.  HENT:     Mouth/Throat:     Mouth: Mucous membranes are moist.     Pharynx: Oropharynx is clear. No oropharyngeal exudate or posterior oropharyngeal erythema.  Eyes:     Extraocular Movements: Extraocular movements intact.     Conjunctiva/sclera: Conjunctivae normal.     Pupils: Pupils are equal, round, and reactive to light.  Cardiovascular:     Rate and Rhythm: Normal rate and regular rhythm.     Pulses: Normal pulses.     Heart sounds: Normal heart sounds. No murmur heard. Pulmonary:     Effort: Pulmonary effort is normal. No respiratory distress.     Breath sounds: Normal breath sounds. No wheezing, rhonchi or rales.   Musculoskeletal:     Right lower leg: No edema.     Left lower leg: No edema.  Skin:    General: Skin is warm and dry.     Findings: No rash.  Neurological:     Mental Status: She is alert.  Psychiatric:        Mood and Affect: Mood normal.  Behavior: Behavior normal.       Results for orders placed or performed during the hospital encounter of 08/24/22  Glucose, capillary  Result Value Ref Range   Glucose-Capillary 106 (H) 70 - 99 mg/dL  Pregnancy, urine POC  Result Value Ref Range   Preg Test, Ur NEGATIVE NEGATIVE  Surgical pathology  Result Value Ref Range   SURGICAL PATHOLOGY      SURGICAL PATHOLOGY CASE: [id number] PATIENT: Era Bumpers Surgical Pathology Report     Specimen Submitted: A. Colon polyp x2, cecum; cbx  Clinical History: Screening colonoscopy.  Colon polyps, internal hemorrhoids      DIAGNOSIS: A.  COLON POLYP X 2, CECUM; COLD BIOPSY: - TUBULAR ADENOMA (1). - COLONIC MUCOSA WITH PROMINENT LYMPHOID AGGREGATE AND FOCAL HYPERPLASTIC EPITHELIAL CHANGE (1). - NEGATIVE FOR HIGH-GRADE DYSPLASIA AND MALIGNANCY.  GROSS DESCRIPTION: A. Labeled: cbx cecal polyp x 2 Received: Formalin Collection time: 9:11 AM on 08/24/2022 Placed into formalin time: 9:11 AM on 08/24/2022 Tissue fragment(s): 2 Size: Range from 0.6-0.7 cm Description: Pink-tan soft tissue fragments Entirely submitted in 1 cassette.  RB 08/24/2022  Final Diagnosis performed by Quay Burow, MD.   Electronically signed 08/27/2022 1:17:06PM The electronic signature indicates that the named Attending Pathologist has evaluated the specimen Technical compone nt performed at [facility], [address], [place], [place] [address] Lab: [id number] Dir: Rush Farmer, MD, MMM  Professional component performed at [facility], [facility], [place], [place], [place] [address] Lab: [phone] Dir: Kathi Simpers, MD     Assessment & Plan:   Problem List  Items Addressed This Visit   None    No orders of the defined types were placed in this encounter.   No orders of the defined types were placed in this encounter.   There are no Patient Instructions on file for this visit.  Follow up plan: No follow-ups on file.  [name], MD

## 2022-10-10 NOTE — Patient Instructions (Addendum)
Blood pressure is staying too high. Take blood pressure medicines daily. Monitor blood pressures at home, let me know if running consistently >140/90.  Call GYN to discuss pain with sex as well as recent period.  Return in 3 months for blood pressure follow up visit  Urine test today.

## 2022-10-11 ENCOUNTER — Encounter: Payer: Self-pay | Admitting: Family Medicine

## 2022-10-11 DIAGNOSIS — N941 Unspecified dyspareunia: Secondary | ICD-10-CM | POA: Insufficient documentation

## 2022-10-11 MED ORDER — FEXOFENADINE HCL 180 MG PO TABS: 360.0000 mg | ORAL_TABLET | Freq: Two times a day (BID) | ORAL | Status: AC

## 2022-10-11 NOTE — Assessment & Plan Note (Signed)
Continues monthly B12 shots.

## 2022-10-11 NOTE — Assessment & Plan Note (Signed)
Notes new pain with sex as well as normal period after going without a cycle for 8 months. Known h/o uterine fibroid. Discussed recurrent period may be normal if in perimenopause. Rec GYN f/u dyspareunia.

## 2022-10-11 NOTE — Assessment & Plan Note (Addendum)
Chronic. BP markedly elevated today - she is on prednisone and has also missed several days of antihypertensive. Reviewed importance of adherence to antihypertensives. Reassess control at 3 mo f/u visit.  Cyclosporine could also contribute.

## 2022-10-11 NOTE — Assessment & Plan Note (Addendum)
Chronic allergic urticaria followed by [facility] allergist currently on allegra, cyclosporin, and xolair injections.  She notes ongoing difficulty with control despite current regimen, however overall improvement attributed to improved stressors at work.  She notes even low dose prednisone 10mg  provides better effect.  She wonders about prophyrias - no neurovisceral symptoms of acute porphyria - check for PCT with urinary porphyrins.

## 2022-10-11 NOTE — Assessment & Plan Note (Signed)
Notes infrequent Concerta use. Feels attention and irritability are better with improvement in work stressors.

## 2022-10-19 LAB — PORPHYRINS, FRACTIONATED, RANDOM URINE
COPROPORPHYRIN I: 24.1 mcg/g creat (ref 6.5–33.2)
Coproporphyrin III (PORFRU): 48.6 mcg/g creat (ref 4.8–88.6)
HEPTACARBOXYPORPHYRIN: 0.5 mcg/g creat (ref <=3.4)
HEXACARBOXYPORPHYRIN: 0.2 mcg/g creat (ref <=6.3)
PENTACARBOXYPORPHYRIN: 1.5 mcg/g creat (ref <=4.1)
TOTAL PORPHYRINS: 97.8 mcg/g creat (ref 27.0–153.6)
Uroporphyrin I: 20.9 mcg/g creat (ref 3.6–21.1)
Uroporphyrin III (PORFRU): 2 mcg/g creat (ref <=5.6)

## 2022-10-23 ENCOUNTER — Other Ambulatory Visit: Payer: Self-pay | Admitting: Family Medicine

## 2022-10-23 DIAGNOSIS — F902 Attention-deficit hyperactivity disorder, combined type: Secondary | ICD-10-CM

## 2022-10-23 MED ORDER — LORAZEPAM 0.5 MG PO TABS: ORAL_TABLET | ORAL | 0 refills | Status: DC

## 2022-10-23 MED ORDER — METHYLPHENIDATE HCL ER (OSM) 18 MG PO TBCR: 18.0000 mg | EXTENDED_RELEASE_TABLET | Freq: Every day | ORAL | 0 refills | Status: DC

## 2022-10-23 NOTE — Telephone Encounter (Signed)
Last office visit 10/10/22 Concerta last refill 08/30/22 #30 Lorazepam last refill 08/30/22 #30

## 2022-10-23 NOTE — Addendum Note (Signed)
Addended by: Sydell Axon C on: 10/23/2022 10:51 AM   Modules accepted: Orders

## 2022-10-23 NOTE — Telephone Encounter (Signed)
ERx 

## 2022-10-23 NOTE — Telephone Encounter (Signed)
Prescription Request  10/23/2022  LOV: 10/10/2022  What is the name of the medication or equipment? methylphenidate (CONCERTA) 18 MG PO CR tablet   LORazepam (ATIVAN) 0.5 MG tablet   Have you contacted your pharmacy to request a refill? No   Which pharmacy would you like this sent to?  [id number]en, [place] - [address] [place] [address] Phone: [phone] Fax: [phone]    Patient notified that their request is being sent to the clinical staff for review and that they should receive a response within 2 business days.   Please advise at Mobile [phone] (mobile)

## 2022-11-14 ENCOUNTER — Ambulatory Visit (INDEPENDENT_AMBULATORY_CARE_PROVIDER_SITE_OTHER): Payer: BC Managed Care – PPO | Admitting: Primary Care

## 2022-11-14 ENCOUNTER — Encounter: Payer: Self-pay | Admitting: Primary Care

## 2022-11-14 VITALS — BP 126/80 | HR 105 | Temp 97.2°F | Ht 65.0 in | Wt 241.0 lb

## 2022-11-14 DIAGNOSIS — L501 Idiopathic urticaria: Secondary | ICD-10-CM

## 2022-11-14 MED ORDER — BENZONATATE 200 MG PO CAPS: 200.0000 mg | ORAL_CAPSULE | Freq: Three times a day (TID) | ORAL | 0 refills | Status: DC | PRN

## 2022-11-14 MED ORDER — PREDNISONE 20 MG PO TABS: ORAL_TABLET | ORAL | 0 refills | Status: DC

## 2022-11-14 NOTE — Assessment & Plan Note (Signed)
Current flare. Will treat to help with further improvement.  Also agree that other symptoms are side effects from Xolair vs early viral illness. Will treat conservatively.   Start prednisone 20 mg tablets. Take 2 tablets by mouth once daily in the morning for 5 days. Continue Xolair 150 mg every 2 weeks.  Start Benzonatate capsules for cough. Take 1 capsule by mouth three times daily as needed for cough.  Return precautions provided.

## 2022-11-14 NOTE — Patient Instructions (Signed)
Start prednisone 20 mg tablets. Take 2 tablets by mouth once daily in the morning for 5 days.  You may take Benzonatate capsules for cough. Take 1 capsule by mouth three times daily as needed for cough.  It was a pleasure meeting you!
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
## 2022-12-21 ENCOUNTER — Telehealth: Payer: Self-pay | Admitting: Family Medicine

## 2022-12-21 DIAGNOSIS — F902 Attention-deficit hyperactivity disorder, combined type: Secondary | ICD-10-CM

## 2022-12-21 MED ORDER — LORAZEPAM 0.5 MG PO TABS: ORAL_TABLET | ORAL | 0 refills | Status: DC

## 2022-12-21 MED ORDER — METHYLPHENIDATE HCL ER (OSM) 18 MG PO TBCR: 18.0000 mg | EXTENDED_RELEASE_TABLET | Freq: Every day | ORAL | 0 refills | Status: DC

## 2022-12-21 NOTE — Telephone Encounter (Signed)
Prescription Request  12/21/2022  LOV: 10/10/2022  What is the name of the medication or equipment? LORazepam (ATIVAN) 0.5 MG tablet methylphenidate (CONCERTA) 18 MG PO CR tablet   Have you contacted your pharmacy to request a refill? Yes   Which pharmacy would you like this sent to?  [id number], [place] - [address] [place] [address] Phone: [phone] Fax: [phone]    Patient notified that their request is being sent to the clinical staff for review and that they should receive a response within 2 business days.   Please advise at Mobile [phone] (mobile)

## 2022-12-21 NOTE — Telephone Encounter (Signed)
ERx 

## 2022-12-21 NOTE — Telephone Encounter (Signed)
Name of Medication: Lorazepam, Concerta Name of Pharmacy: [facility] Last Fill or Written Date and Quantity:       Lorazepam- 10/23/22, #30      Concerta- 10/23/22, #30 Last Office Visit and Type: 07/10/22, CPE Next Office Visit and Type: 10/10/22, 3 mo wt mgmt f/u Last Controlled Substance Agreement Date: 10/30/17 Last UDS: 10/30/17

## 2023-01-11 ENCOUNTER — Ambulatory Visit (INDEPENDENT_AMBULATORY_CARE_PROVIDER_SITE_OTHER): Payer: BC Managed Care – PPO | Admitting: Family Medicine

## 2023-01-11 ENCOUNTER — Encounter: Payer: Self-pay | Admitting: Family Medicine

## 2023-01-11 VITALS — BP 136/100 | HR 81 | Temp 97.2°F | Ht 65.0 in | Wt 242.1 lb

## 2023-01-11 DIAGNOSIS — I10 Essential (primary) hypertension: Secondary | ICD-10-CM

## 2023-01-11 DIAGNOSIS — L501 Idiopathic urticaria: Secondary | ICD-10-CM

## 2023-01-11 DIAGNOSIS — M25541 Pain in joints of right hand: Secondary | ICD-10-CM | POA: Diagnosis not present

## 2023-01-11 LAB — SEDIMENTATION RATE: Sed Rate: 14 mm/hr (ref 0–30)

## 2023-01-11 MED ORDER — AMLODIPINE BESYLATE 5 MG PO TABS: 5.0000 mg | ORAL_TABLET | Freq: Every day | ORAL | 6 refills | Status: DC

## 2023-01-11 MED ORDER — METOPROLOL SUCCINATE ER 25 MG PO TB24: 25.0000 mg | ORAL_TABLET | Freq: Every day | ORAL | 6 refills | Status: DC

## 2023-01-11 NOTE — Progress Notes (Signed)
Ph: [phone] Fax: [phone]   Patient ID: Theresa Pratt, female    DOB: [birth year], 52 y.o.   MRN: [id number]  This visit was conducted in person.  BP (!) 136/100   Pulse 81   Temp (!) 97.2 F (36.2 C) (Temporal)   Ht 5\' 5"  (1.651 m)   Wt 242 lb 2 oz (109.8 kg)   SpO2 94%   BMI 40.29 kg/m   BP Readings from Last 3 Encounters:  01/11/23 (!) 136/100  11/14/22 126/80  10/10/22 (!) 152/100  150/100 on recheck   CC: 3 mo HTN f/u visit  Subjective:   HPI: Theresa Pratt is a 52 y.o. female presenting on 01/11/2023 for Medical Management of Chronic Issues (Here for 3 mo HTN follow up. )   HTN - Compliant with current antihypertensive regimen of amlodipine 2.5mg  daily toprol XL 50mg  daily. Does check blood pressures at home: 128-140/80s-100. No low blood pressure readings or symptoms of dizziness/syncope. Denies vision changes, CP/tightness, SOB, leg swelling.   Recent Xolair injection Monday this week. Gets 2 shots every 2 weeks.  She also continues allegra 2 tab BID and cyclosporine 25mg  2 tab BID.  Upcoming allergist appt 01/2023  Asks about possible rheumatoid arthritis given fmhx (maternal grandmother). She endorses chronic intermittent R 1st MCP joint inflammation along with other MCPs and IPs of R hand, as well as swelling to toes.   Lab Results  Component Value Date   VITAMINB12 533 06/27/2022       Relevant past medical, surgical, family and social history reviewed and updated as indicated. Interim medical history since our last visit reviewed. Allergies and medications reviewed and updated. Outpatient Medications Prior to Visit  Medication Sig Dispense Refill   ascorbic acid (VITAMIN C) 500 MG tablet Take 1 tablet (500 mg total) by mouth daily.     benzonatate (TESSALON) 200 MG capsule Take 1 capsule (200 mg total) by mouth 3 (three) times daily as needed for cough. 15 capsule 0   Calcium Magnesium Zinc 333-133-5 MG TABS Take 1 tablet by mouth  daily.     Cholecalciferol (VITAMIN D3) 1.25 MG (50000 UT) TABS Take 1 tablet by mouth once a week. 12 tablet 3   cyanocobalamin (,VITAMIN B-12,) 1000 MCG/ML injection Inject 1 mL (1,000 mcg total) into the muscle every 30 (thirty) days. 3 mL 2   cycloSPORINE modified (NEORAL) 50 MG capsule Take 50 mg by mouth 2 (two) times daily.     EPINEPHrine 0.3 mg/0.3 mL IJ SOAJ injection      famotidine (PEPCID) 20 MG tablet Take 1 tablet (20 mg total) by mouth 2 (two) times daily. 180 tablet 1   ferrous sulfate (SLOW RELEASE IRON) 160 (50 Fe) MG TBCR SR tablet Take 1 tablet (160 mg total) by mouth daily.     fexofenadine (ALLEGRA ALLERGY) 180 MG tablet Take 2 tablets (360 mg total) by mouth in the morning and at bedtime.     hydrOXYzine (ATARAX) 10 MG tablet Take 1 tablet (10 mg total) by mouth daily as needed for itching (hives). 90 tablet 2   Imiquimod 3.75 % CREA Apply to affected area 2-3 times weekly for 6 weeks 7.5 g 0   Iron-Vitamins (GERITOL PO) Take 1 tablet by mouth daily.     LORazepam (ATIVAN) 0.5 MG tablet TAKE 1 TABLET BY MOUTH TWICE A DAY AS NEEDED FOR ANXIETY Strength: 0.5 mg 30 tablet 0   methylphenidate (CONCERTA) 18 MG PO CR tablet Take 1  tablet (18 mg total) by mouth daily. 30 tablet 0   montelukast (SINGULAIR) 10 MG tablet Take 1 tablet (10 mg total) by mouth at bedtime. 30 tablet 11   Multiple Vitamin (MULTIVITAMIN) tablet Take 1 tablet by mouth daily.     predniSONE (DELTASONE) 20 MG tablet Take 2 tablets by mouth once daily in the morning for 5 days. 10 tablet 0   Syringe/Needle, Disp, (SYRINGE 3CC/25GX1") 25G X 1" 3 ML MISC Use with B12 injection once monthly 1 each 6   thiamine 100 MG tablet Take 1 tablet (100 mg total) by mouth daily.     triamcinolone (KENALOG) 0.025 % ointment Apply 1 Application topically 2 (two) times daily. 30 g 0   XOLAIR 150 MG/ML prefilled syringe Inject 300 mg into the skin every 14 (fourteen) days.     amLODipine (NORVASC) 2.5 MG tablet Take 1 tablet  (2.5 mg total) by mouth daily. 30 tablet 9   atomoxetine (STRATTERA) 40 MG capsule Take by mouth.     metoprolol succinate (TOPROL-XL) 50 MG 24 hr tablet Take 1 tablet (50 mg total) by mouth daily. Take with or immediately following a meal. 90 tablet 2   No facility-administered medications prior to visit.     Per HPI unless specifically indicated in ROS section below Review of Systems  Objective:  BP (!) 136/100   Pulse 81   Temp (!) 97.2 F (36.2 C) (Temporal)   Ht 5\' 5"  (1.651 m)   Wt 242 lb 2 oz (109.8 kg)   SpO2 94%   BMI 40.29 kg/m   Wt Readings from Last 3 Encounters:  01/11/23 242 lb 2 oz (109.8 kg)  11/14/22 241 lb (109.3 kg)  10/10/22 239 lb (108.4 kg)      Physical Exam Vitals and nursing note reviewed.  Constitutional:      Appearance: Normal appearance. She is not ill-appearing.  HENT:     Mouth/Throat:     Mouth: Mucous membranes are moist.     Pharynx: Oropharynx is clear. No oropharyngeal exudate or posterior oropharyngeal erythema.  Eyes:     Extraocular Movements: Extraocular movements intact.     Pupils: Pupils are equal, round, and reactive to light.  Cardiovascular:     Rate and Rhythm: Normal rate and regular rhythm.     Pulses: Normal pulses.     Heart sounds: Normal heart sounds. No murmur heard. Pulmonary:     Effort: Pulmonary effort is normal. No respiratory distress.     Breath sounds: Normal breath sounds. No wheezing, rhonchi or rales.  Musculoskeletal:        General: Tenderness present. No swelling.     Right lower leg: No edema.     Left lower leg: No edema.     Comments:  No active synovitis Discomfort to palpation at 1st MCP on right  Skin:    General: Skin is warm and dry.     Findings: No erythema or rash.  Neurological:     Mental Status: She is alert.  Psychiatric:        Mood and Affect: Mood normal.        Behavior: Behavior normal.       Results for orders placed or performed in visit on 10/10/22  Porphyrins,  fractionated, random urine  Result Value Ref Range   Uroporphyrin I 20.9 3.6 - 21.1 mcg/g creat   Uroporphyrin III (PORFRU) 2.0 < OR = 5.6 mcg/g creat   HEPTACARBOXYPORPHYRIN 0.5 < OR =  3.4 mcg/g creat   HEXACARBOXYPORPHYRIN 0.2 < OR = 6.3 mcg/g creat   PENTACARBOXYPORPHYRIN 1.5 < OR = 4.1 mcg/g creat   COPROPORPHYRIN I 24.1 6.5 - 33.2 mcg/g creat   Coproporphyrin III (PORFRU) 48.6 4.8 - 88.6 mcg/g creat   TOTAL PORPHYRINS 97.8 27.0 - 153.6 mcg/g creat   Interpretation SEE NOTE     Assessment & Plan:   Problem List Items Addressed This Visit   None Visit Diagnoses     Arthralgia of right hand    -  Primary   Relevant Orders   Sedimentation rate   Rheumatoid factor        Meds ordered this encounter  Medications   metoprolol succinate (TOPROL-XL) 25 MG 24 hr tablet    Sig: Take 1 tablet (25 mg total) by mouth daily. Take with or immediately following a meal.    Dispense:  30 tablet    Refill:  6    Note new dose   amLODipine (NORVASC) 5 MG tablet    Sig: Take 1 tablet (5 mg total) by mouth daily.    Dispense:  30 tablet    Refill:  6    Note new dose    Orders Placed This Encounter  Procedures   Sedimentation rate   Rheumatoid factor    Patient Instructions  Labs today  Change blood pressure medicines - increase amlodipine to 5mg  daily, drop metoprolol XL to 25mg  daily Continue to monitor blood pressure at home - goal <140/90.  Return in 3 months for blood pressure check   Follow up plan: Return in about 3 months (around 04/13/2023) for annual exam, prior fasting for blood work.  Eustaquio Boyden, MD

## 2023-01-11 NOTE — Patient Instructions (Addendum)
Labs today  Change blood pressure medicines - increase amlodipine to 5mg  daily, drop metoprolol XL to 25mg  daily Continue to monitor blood pressure at home - goal <140/90.  Return in 3 months for blood pressure check

## 2023-01-12 DIAGNOSIS — M25541 Pain in joints of right hand: Secondary | ICD-10-CM | POA: Insufficient documentation

## 2023-01-12 NOTE — Assessment & Plan Note (Addendum)
Chronic, BP above goal despite current regimen - will increase amlodipine to 5mg  daily, and given endorsed fatigue will drop Toprol XL to 25mg  daily. Reassess at 3 mo HTN f/u. I also asked her to continue monitoring BP at home.

## 2023-01-12 NOTE — Assessment & Plan Note (Signed)
Notes chronic intermittent pain and swelling predominantly to R 1st MCP joint. In fmhx RA, reasonable to further evaluate for this. Check ESR, RF.

## 2023-01-12 NOTE — Assessment & Plan Note (Signed)
Chronic, with pretty severe symptoms, followed by allergist on allegra 360mg  BID, cyclosporine 50mg  BID, and Xolair 300mg  injections every 2 weeks. Despite this continues to have breakthrough symptoms, has previously taken PRN prednisone.

## 2023-01-13 ENCOUNTER — Ambulatory Visit
Admission: EM | Admit: 2023-01-13 | Discharge: 2023-01-13 | Disposition: A | Discharge status: Home / Self Care | Payer: BC Managed Care – PPO | Attending: Emergency Medicine | Admitting: Emergency Medicine

## 2023-01-13 ENCOUNTER — Encounter: Payer: Self-pay | Admitting: Emergency Medicine

## 2023-01-13 ENCOUNTER — Other Ambulatory Visit: Payer: Self-pay

## 2023-01-13 DIAGNOSIS — R21 Rash and other nonspecific skin eruption: Secondary | ICD-10-CM | POA: Diagnosis not present

## 2023-01-13 MED ORDER — PREDNISONE 10 MG (21) PO TBPK: ORAL_TABLET | ORAL | 0 refills | Status: DC

## 2023-01-13 NOTE — ED Provider Notes (Signed)
[facility]    CSN: [id number] Arrival date & time: 01/13/23  1446      History   Chief Complaint Chief Complaint  Patient presents with   Rash    HPI Theresa Pratt is a 52 y.o. female.   HPI  52 year old female with a past medical history significant for ADHD, allergies, arthropathy, hypertension, IDA, and diabetes presents for evaluation of skin rash.  She is currently prescribed Xolair, Atarax, Singulair, Pepcid, Zyrtec, and Allegra.  She has been taking all of those medications without any improvement of her symptoms.  She saw her PCP on Friday and had a rheumatoid factor drawn but it is not back yet.  At that time she had redness, swelling, and heat from both of her shoulders, which have resolved.  Now the rashes on her upper chest, left neck, and back.  She describes it as being painful and feeling like a burn.  No injury.  She does report that when she goes out in the sun the hives tend to go away.  She denies any shortness of breath or wheezing.  Past Medical History:  Diagnosis Date   ADHD, adult residual type    Allergic rhinitis    post gastric bypass   Allergy    Allergy status to other drugs, medicaments and biological substances 12/22/2021   Anxiety    Arthropathy    weight bearing (pre gastric bypass)   Chronic urticaria    Diarrhea, functional    DM (diabetes mellitus) (HCC)    pre gastric bypass, pt states she has not been treated for it in years, her doctor doesn't discuss it with her anymore   GERD (gastroesophageal reflux disease)    post gastric bypass   Heart murmur    had it in the past   History of chicken pox    History of open reduction and internal fixation (ORIF) procedure 02/21/2017   HTN (hypertension)    Pre gastric bypass   HTN (hypertension)    post gastric bypass   IDA (iron deficiency anemia)    post gastric bypass (iron infusion by hematology-doesn't build stores -Dr. 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   Oligomenorrhea    on Implanon (pre gastric bypass)   PONV (postoperative nausea and vomiting)    after 1st surgery   Postpartum depression    post gastric bypass   Pre-eclampsia    post gastric bypass   Restless legs     Patient Active Problem List   Diagnosis Date Noted   Arthralgia of right hand 01/12/2023   Dyspareunia in female 10/11/2022   Polyp of cecum 08/24/2022   Encounter for general adult medical examination with abnormal findings 07/10/2022   Immunization deficiency 06/08/2020   Epidermal cyst 04/26/2020   Thiamine deficiency 10/22/2019   Severe obesity (BMI 35.0-39.9) with comorbidity (HCC) 06/03/2018   Displaced fracture of lateral malleolus of left fibula, initial encounter for closed fracture 02/19/2017   Vitamin B12 deficiency 12/21/2016   Attention deficit hyperactivity disorder (ADHD), 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   Chronic idiopathic urticaria 03/05/2016   PTSD (post-traumatic stress disorder) 02/28/2016   Anxiety with depression 06/29/2015   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   BACK PAIN, LUMBAR 07/14/2010   Vitamin D deficiency 07/03/2010   Allergic rhinitis 07/03/2010   GERD 07/03/2010   Essential hypertension 06/22/2010    Past Surgical History:  Procedure Laterality  Date   CHOLECYSTECTOMY  08/2010   COLONOSCOPY WITH PROPOFOL N/A 08/24/2022   TAx1, rpt 7 yrs (Vanga, Loel Dubonnet, MD)   FRACTURE SURGERY     GASTRIC BYPASS  2003   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 Authorizing Provider  predniSONE (STERAPRED UNI-PAK 21 TAB) 10 MG (21) TBPK tablet Take 6 tablets on day 1, 5 tablets day 2, 4 tablets day 3, 3 tablets day 4, 2 tablets day 5, 1 tablet day 6 01/13/23  Yes Becky Augusta, NP  amLODipine (NORVASC) 5 MG tablet Take 1 tablet (5 mg total) by mouth daily. 01/11/23   Eustaquio Boyden, MD  ascorbic acid (VITAMIN C) 500 MG tablet Take 1 tablet (500 mg total) by mouth daily. 06/08/20   Eustaquio Boyden, MD  benzonatate (TESSALON) 200 MG capsule Take 1 capsule (200 mg total) by mouth 3 (three) times daily as needed for cough. 11/14/22   Doreene Nest, NP  Calcium Magnesium Zinc 333-133-5 MG TABS Take 1 tablet by mouth daily. 06/08/20   Eustaquio Boyden, MD  Cholecalciferol (VITAMIN D3) 1.25 MG (50000 UT) TABS Take 1 tablet by mouth once a week. 07/10/22   Eustaquio Boyden, MD  cyanocobalamin (,VITAMIN B-12,) 1000 MCG/ML injection Inject 1 mL (1,000 mcg total) into the muscle every 30 (thirty) days. 10/10/21   Eustaquio Boyden, MD  cycloSPORINE modified (NEORAL) 50 MG capsule Take 50 mg by mouth 2 (two) times daily.    [provider]  EPINEPHrine 0.3 mg/0.3 mL IJ SOAJ injection  04/16/22   [provider]  famotidine (PEPCID) 20 MG tablet Take 1 tablet (20 mg total) by mouth 2 (two) times daily. 12/03/20   Tommie Sams, DO  ferrous sulfate (SLOW RELEASE IRON) 160 (50 Fe) MG TBCR SR tablet Take 1 tablet (160 mg total) by mouth daily. 07/10/22   Eustaquio Boyden, MD  fexofenadine [facility] ALLERGY) 180 MG tablet Take 2 tablets (360 mg total) by mouth in the morning and at bedtime. 10/11/22   Eustaquio Boyden, MD  hydrOXYzine (ATARAX) 10 MG tablet Take 1 tablet (10 mg total) by mouth daily as needed for itching (hives). 12/16/21   Becky Augusta, NP  Imiquimod 3.75 %  CREA Apply to affected area 2-3 times weekly for 6 weeks 02/14/21   Adam Phenix, MD  Iron-Vitamins (GERITOL PO) Take 1 tablet by mouth daily.    [provider]  LORazepam (ATIVAN) 0.5 MG tablet TAKE 1 TABLET BY MOUTH TWICE A DAY AS NEEDED FOR ANXIETY Strength: 0.5 mg 12/21/22   Eustaquio Boyden, MD  methylphenidate (CONCERTA) 18 MG PO  CR tablet Take 1 tablet (18 mg total) by mouth daily. 12/21/22   Eustaquio Boyden, MD  metoprolol succinate (TOPROL-XL) 25 MG 24 hr tablet Take 1 tablet (25 mg total) by mouth daily. Take with or immediately following a meal. 01/11/23   Eustaquio Boyden, MD  montelukast (SINGULAIR) 10 MG tablet Take 1 tablet (10 mg total) by mouth at bedtime. 05/04/22   Eustaquio Boyden, MD  Multiple Vitamin (MULTIVITAMIN) tablet Take 1 tablet by mouth daily.    [provider]  Syringe/Needle, Disp, (SYRINGE 3CC/25GX1") 25G X 1" 3 ML MISC Use with B12 injection once monthly 01/27/21   Eustaquio Boyden, MD  thiamine 100 MG tablet Take 1 tablet (100 mg total) by mouth daily. 11/03/19   Eustaquio Boyden, MD  triamcinolone (KENALOG) 0.025 % ointment Apply 1 Application topically 2 (two) times daily. 04/21/22   Becky Augusta, NP  Geoffry Paradise 150 MG/ML prefilled syringe Inject 300 mg into the skin every 14 (fourteen) days. 10/11/22   Eustaquio Boyden, MD    Family History Family History  Problem Relation Age of Onset   Coronary artery disease Father        2 stents; + smoker   Hypertension Father    Thyroid disease Brother    Diabetes Maternal Grandmother    Cirrhosis Paternal Grandmother    Alcohol abuse Paternal Grandmother    Breast cancer Other        Paternal great aunt(obesity, smoker, EtOHic)   Breast cancer Other        Maternal great aunt (deceased in 69's from same)   Thyroid disease Other        cousins and grandmother   Cancer Other        great aunt - lung   Liver disease Paternal Aunt    Cancer Cousin        liver, bile duct   Cancer Mother         uterine   Uterine cancer Mother    Colon polyps Neg Hx    Colon cancer Neg Hx    Esophageal cancer Neg Hx    Rectal cancer Neg Hx    Stomach cancer Neg Hx     Social History Social History   Tobacco Use   Smoking status: Never   Smokeless tobacco: Never  Vaping Use   Vaping Use: Never used  Substance Use Topics   Alcohol use: Yes    Comment: occasional   Drug use: No     Allergies   Focalin [dexmethylphenidate], Sertraline, and Sulfa antibiotics   Review of Systems Review of Systems  Respiratory:  Negative for shortness of breath and wheezing.   Skin:  Positive for color change and rash.     Physical Exam Triage Vital Signs ED Triage Vitals  Enc Vitals Group     BP      Pulse      Resp      Temp      Temp src      SpO2      Weight      Height      Head Circumference      Peak Flow      Pain Score      Pain Loc      Pain Edu?      Excl. in GC?    No data found.  Updated Vital Signs BP 117/70 (BP Location: Left Arm) Comment: large cuff Comment (BP Location): large cuff  Pulse (!) 110  Temp 98.8 F (37.1 C) (Oral)   Resp 20   SpO2 98%   Visual Acuity Right Eye Distance:   Left Eye Distance:   Bilateral Distance:    Right Eye Near:   Left Eye Near:    Bilateral Near:     Physical Exam Vitals and nursing note reviewed.  Constitutional:      Appearance: Normal appearance. She is not ill-appearing.  HENT:     Head: Normocephalic and atraumatic.  Skin:    General: Skin is warm and dry.     Capillary Refill: Capillary refill takes less than 2 seconds.     Findings: Erythema and rash present.  Neurological:     General: No focal deficit present.     Mental Status: She is alert and oriented to person, place, and time.      UC Treatments / Results  Labs (all labs ordered are listed, but only abnormal results are displayed) Labs Reviewed - No data to display  EKG   Radiology No results found.  Procedures Procedures (including  critical care time)  Medications Ordered in UC Medications - No data to display  Initial Impression / Assessment and Plan / UC Course  I have reviewed the triage vital signs and the nursing notes.  Pertinent labs & imaging results that were available during my care of the patient were reviewed by me and considered in my medical decision making (see chart for details).   The patient is a pleasant, nontoxic-appearing 52 year old female presenting for evaluation of skin rash as outlined HPI above.  On exam she does have a rash on her dcolletage, left side of her neck, and back of her neck that is erythematous, tender to touch, and hot to touch.  She reports that her shoulders presented the same way 2 days ago and that this rash is transient and migratory.  She is describing the rash as being tender and feeling like a burn.  She also feels that her skin is tender when she touches it.  She has been worked up for autoimmune in the past but not in the last 7 years.  She did see her PCP on Friday who drew a rheumatology factor and has a referred to a specialist for later in July.  He did not prescribe any prednisone yesterday.  Patient is taking multiple antihistamines without improvement of symptoms.  We discussed that long-term steroid use has a list of complications that come along with it.  However, in the short-term they may help her symptoms so I will prescribe a prednisone taper.  If the rash does not improve, the tenderness does not improve, or the redness does not improve she needs to return for reevaluation.  Otherwise she should continue her medicines as prescribed and follow-up with the specialist at the end of July as scheduled.   Final Clinical Impressions(s) / UC Diagnoses   Final diagnoses:  Rash and nonspecific skin eruption     Discharge Instructions      Take the prednisone as directed starting tomorrow morning.  Continue your other medications as directed.   Keep your  appointment with your specialist as scheduled.     ED Prescriptions     Medication Sig Dispense Auth. Provider   predniSONE (STERAPRED UNI-PAK 21 TAB) 10 MG (21) TBPK tablet Take 6 tablets on day 1, 5 tablets day 2, 4 tablets day 3, 3 tablets day 4, 2 tablets day 5, 1 tablet day 6 21 tablet Becky Augusta, NP  PDMP not reviewed this encounter.   Becky Augusta, NP 01/13/23 [phone]

## 2023-01-13 NOTE — ED Triage Notes (Signed)
Generalized rash, particularly to chest.  Reports a burning sensation and itches all night.    Patient has had this history of itching for 2 years.   Initially told residual from Xolair.  Has taken hydroxyzine, cetrizine, cyclosporin, allegra.    Patient is particularly concerned about issues trying to sleep.  Did see pcp on Friday, but did not get any medications.  Sees specialist on July 15.

## 2023-01-13 NOTE — Discharge Instructions (Addendum)
Take the prednisone as directed starting tomorrow morning.  Continue your other medications as directed.   Keep your appointment with your specialist as scheduled.

## 2023-01-14 LAB — RHEUMATOID FACTOR: Rheumatoid fact SerPl-aCnc: <10 IU/mL (ref <14)

## 2023-02-06 ENCOUNTER — Other Ambulatory Visit: Payer: Self-pay | Admitting: Family Medicine

## 2023-02-06 DIAGNOSIS — F902 Attention-deficit hyperactivity disorder, combined type: Secondary | ICD-10-CM

## 2023-02-06 DIAGNOSIS — F418 Other specified anxiety disorders: Secondary | ICD-10-CM

## 2023-02-06 NOTE — Telephone Encounter (Signed)
Prescription Request  02/06/2023  LOV: 01/11/2023  What is the name of the medication or equipment?  LORazepam (ATIVAN) 0.5 MG tablet  methylphenidate (CONCERTA) 18 MG PO CR tablet   Have you contacted your pharmacy to request a refill? Yes   Which pharmacy would you like this sent to?  [id number], [place] - [address] [place] [address] Phone: [phone] Fax: [phone]    Patient notified that their request is being sent to the clinical staff for review and that they should receive a response within 2 business days.   Please advise at [facility] [phone]

## 2023-02-06 NOTE — Telephone Encounter (Signed)
Name of Medication:  Lorazepam, Concerta Name of Pharmacy:  [facility] Last Fill or Written Date and Quantity:  12/21/22      Lorazepam- #30      Concerta- #30 Last Office Visit and Type:  01/11/23, 3 mo HTN f/u Next Office Visit and Type:  none Last Controlled Substance Agreement Date:  none Last UDS:  none

## 2023-02-07 MED ORDER — LORAZEPAM 0.5 MG PO TABS
ORAL_TABLET | ORAL | 0 refills | Status: DC
Start: 2023-02-07 — End: 2023-03-21

## 2023-02-07 MED ORDER — METHYLPHENIDATE HCL ER (OSM) 18 MG PO TBCR: 18.0000 mg | EXTENDED_RELEASE_TABLET | Freq: Every day | ORAL | 0 refills | Status: DC

## 2023-02-07 NOTE — Telephone Encounter (Signed)
ERx 

## 2023-03-21 ENCOUNTER — Other Ambulatory Visit: Payer: Self-pay | Admitting: Family Medicine

## 2023-03-21 DIAGNOSIS — F418 Other specified anxiety disorders: Secondary | ICD-10-CM

## 2023-03-21 DIAGNOSIS — F902 Attention-deficit hyperactivity disorder, combined type: Secondary | ICD-10-CM

## 2023-03-21 NOTE — Telephone Encounter (Signed)
Name of Medication:  Lorazepam, Concerta Name of Pharmacy:  [facility] Last Fill or Written Date and Quantity:  02/07/23      Lorazepam- #30      Concerta- #30 Last Office Visit and Type:  01/11/23, 3 mo HTN f/u Next Office Visit and Type:  none Last Controlled Substance Agreement Date:  none Last UDS:  none

## 2023-03-21 NOTE — Telephone Encounter (Signed)
Prescription Request  03/21/2023  LOV: 01/11/2023  What is the name of the medication or equipment? methylphenidate (CONCERTA) 18 MG PO CR tablet   LORazepam (ATIVAN) 0.5 MG tablet   Have you contacted your pharmacy to request a refill? No   Which pharmacy would you like this sent to?  [id number], [place] - [address] [place] [address] Phone: [phone] Fax: [phone]    Patient notified that their request is being sent to the clinical staff for review and that they should receive a response within 2 business days.   Please advise at Mobile [phone] (mobile)

## 2023-03-22 MED ORDER — METHYLPHENIDATE HCL ER (OSM) 18 MG PO TBCR
18.0000 mg | EXTENDED_RELEASE_TABLET | Freq: Every day | ORAL | 0 refills | Status: DC
Start: 2023-03-22 — End: 2023-04-24

## 2023-03-22 MED ORDER — LORAZEPAM 0.5 MG PO TABS
ORAL_TABLET | ORAL | 0 refills | Status: DC
Start: 2023-03-22 — End: 2023-04-24

## 2023-03-22 NOTE — Telephone Encounter (Signed)
ERx 

## 2023-03-22 NOTE — Telephone Encounter (Signed)
Pt called to check status of med refill. Pt stated she's out of meds. Call back # [phone]

## 2023-04-24 ENCOUNTER — Other Ambulatory Visit: Payer: Self-pay | Admitting: Family Medicine

## 2023-04-24 DIAGNOSIS — F902 Attention-deficit hyperactivity disorder, combined type: Secondary | ICD-10-CM

## 2023-04-24 DIAGNOSIS — F418 Other specified anxiety disorders: Secondary | ICD-10-CM

## 2023-04-24 NOTE — Telephone Encounter (Signed)
Prescription Request  04/24/2023  LOV: 01/11/2023  What is the name of the medication or equipment? LORazepam (ATIVAN) 0.5 MG tablet  methylphenidate (CONCERTA) 18 MG PO CR tablet  Have you contacted your pharmacy to request a refill? No   Which pharmacy would you like this sent to?  [id number], [place] - [address] [place] [address] Phone: [phone] Fax: [phone]    Patient notified that their request is being sent to the clinical staff for review and that they should receive a response within 2 business days.   Please advise at Mobile [phone] (mobile)

## 2023-04-24 NOTE — Telephone Encounter (Signed)
Ativan last filled on 03/22/23 #30 tabs 0 refill Concerta last filled on 03/22/23 #30 tabs/ 0 refill  Last OV was a 3 month f/u on 01/11/23

## 2023-04-25 MED ORDER — LORAZEPAM 0.5 MG PO TABS
ORAL_TABLET | ORAL | 0 refills | Status: DC
Start: 2023-04-25 — End: 2023-05-27

## 2023-04-25 MED ORDER — METHYLPHENIDATE HCL ER (OSM) 18 MG PO TBCR
18.0000 mg | EXTENDED_RELEASE_TABLET | Freq: Every day | ORAL | 0 refills | Status: DC
Start: 2023-04-25 — End: 2023-05-27

## 2023-04-25 NOTE — Telephone Encounter (Signed)
ERx 

## 2023-05-19 ENCOUNTER — Other Ambulatory Visit: Payer: Self-pay | Admitting: Family Medicine

## 2023-05-19 DIAGNOSIS — I10 Essential (primary) hypertension: Secondary | ICD-10-CM

## 2023-05-22 ENCOUNTER — Other Ambulatory Visit: Payer: Self-pay | Admitting: Family Medicine

## 2023-05-23 NOTE — Telephone Encounter (Signed)
ERx failure - denial was not transmitted to pharmacy Pt should be on amlodipine 5mg  and toprol XL 25mg  daily.  Hydrochlorothiazide was stopped 06/2022 due to concern it may be contributing to hives.  Is she having high BPs?  Pt overdue for f/u OV - would call and offer to reschedule.

## 2023-05-23 NOTE — Telephone Encounter (Signed)
Lvm asking pt to call back. Need to relay Dr Timoteo Expose message, get answer to his question and schedule OV for HTN f/u.

## 2023-05-24 NOTE — Telephone Encounter (Signed)
 Lvm asking pt to call back. Need to relay Dr Timoteo Expose message, get answer to his question and schedule OV for HTN f/u.

## 2023-05-27 ENCOUNTER — Other Ambulatory Visit: Payer: Self-pay | Admitting: Family Medicine

## 2023-05-27 DIAGNOSIS — F418 Other specified anxiety disorders: Secondary | ICD-10-CM

## 2023-05-27 DIAGNOSIS — F902 Attention-deficit hyperactivity disorder, combined type: Secondary | ICD-10-CM

## 2023-05-27 MED ORDER — METHYLPHENIDATE HCL ER (OSM) 18 MG PO TBCR
18.0000 mg | EXTENDED_RELEASE_TABLET | Freq: Every day | ORAL | 0 refills | Status: DC
Start: 2023-05-27 — End: 2023-07-03

## 2023-05-27 MED ORDER — LORAZEPAM 0.5 MG PO TABS
ORAL_TABLET | ORAL | 0 refills | Status: DC
Start: 2023-05-27 — End: 2023-07-03

## 2023-05-27 NOTE — Telephone Encounter (Signed)
 Lvm asking pt to call back. Need to relay Dr Timoteo Expose message, get answer to his question and schedule OV for HTN f/u.

## 2023-05-27 NOTE — Telephone Encounter (Signed)
Patient stopped by office and needed a med refill on methylphenidate (CONCERTA) 18 MG PO CR tablet    LORazepam (ATIVAN) 0.5 MG tablet

## 2023-05-27 NOTE — Telephone Encounter (Signed)
Name of Medication:  Lorazepam, Concerta Name of Pharmacy:  [facility] Last Fill or Written Date and Quantity:  04/25/23      Lorazepam- #30      Concerta- #30 Last Office Visit and Type:  01/11/23, 3 mo HTN f/u Next Office Visit and Type:  none Last Controlled Substance Agreement Date:  none Last UDS:  none

## 2023-05-27 NOTE — Telephone Encounter (Signed)
ERx 

## 2023-05-29 NOTE — Telephone Encounter (Signed)
Called patient she did not request refill was error from pharmacy. She has been set up for follow up. She has not checked blood pressure recently but when she did check it was ok.

## 2023-06-09 ENCOUNTER — Other Ambulatory Visit: Payer: Self-pay | Admitting: Family Medicine

## 2023-06-11 NOTE — Telephone Encounter (Signed)
Refill request for Cholecalciferol (VITAMIN D3) 1.25 MG (50000 UT) CAPS   LOV - 01/11/23 Next OV - 06/18/23 Last refill - 07/10/22 #12/3 Last Lab - 06/27/22 and was 25.30 Next Lab has not been scheduled yet

## 2023-06-18 ENCOUNTER — Ambulatory Visit: Payer: BC Managed Care – PPO | Admitting: Family Medicine

## 2023-06-19 ENCOUNTER — Encounter: Payer: Self-pay | Admitting: Family Medicine

## 2023-07-03 ENCOUNTER — Other Ambulatory Visit: Payer: Self-pay | Admitting: *Deleted

## 2023-07-03 DIAGNOSIS — F418 Other specified anxiety disorders: Secondary | ICD-10-CM

## 2023-07-03 DIAGNOSIS — F902 Attention-deficit hyperactivity disorder, combined type: Secondary | ICD-10-CM

## 2023-07-03 NOTE — Telephone Encounter (Signed)
Last office visit 01/11/2023 for HTN, Arthralgia of right hand and Chronic idiopathic urticaria.  Last refilled:  Lorazepam 05/27/23 for #30 with no refills. Methylphenidate 05/27/23 for #30 with no refills.   Next Appt: No future appointments.

## 2023-07-03 NOTE — Telephone Encounter (Signed)
Copied from CRM [id number]. Topic: Clinical - Medication Refill >> Jul 03, 2023  8:38 AM Orinda Kenner C wrote: Most Recent Primary Care Visit:  Provider: Eustaquio Boyden  Department: [facility]  Visit Type: OFFICE VISIT  Date: 01/11/2023  Medication: LORazepam (ATIVAN) 0.5 MG tablet  methylphenidate (CONCERTA) 18 MG PO CR tablet   Has the patient contacted their pharmacy? No (Agent: If no, request that the patient contact the pharmacy for the refill. If patient does not wish to contact the pharmacy document the reason why and proceed with request.) (Agent: If yes, when and what did the pharmacy advise?)  Is this the correct pharmacy for this prescription? Yes If no, delete pharmacy and type the correct one.  This is the patient's preferred pharmacy:  [phone] [place] - [address] [place] [address] Phone: [phone] Fax: [phone]   Has the prescription been filled recently?   Is the patient out of the medication? Yes, need rf today.   Has the patient been seen for an appointment in the last year OR does the patient have an upcoming appointment?   Can we respond through MyChart? No  Agent: Please be advised that Rx refills may take up to 3 business days. We ask that you follow-up with your pharmacy.

## 2023-07-04 ENCOUNTER — Other Ambulatory Visit: Payer: Self-pay | Admitting: Family Medicine

## 2023-07-04 ENCOUNTER — Telehealth: Payer: Self-pay

## 2023-07-04 DIAGNOSIS — F418 Other specified anxiety disorders: Secondary | ICD-10-CM

## 2023-07-04 MED ORDER — LORAZEPAM 0.5 MG PO TABS: ORAL_TABLET | ORAL | 0 refills | Status: DC

## 2023-07-04 MED ORDER — METHYLPHENIDATE HCL ER (OSM) 18 MG PO TBCR
18.0000 mg | EXTENDED_RELEASE_TABLET | Freq: Every day | ORAL | 0 refills | Status: DC
Start: 2023-07-04 — End: 2023-08-13

## 2023-07-04 NOTE — Telephone Encounter (Signed)
ERx 

## 2023-07-04 NOTE — Telephone Encounter (Signed)
Copied from CRM [id number]. Topic: Clinical - Prescription Issue >> Jul 04, 2023 10:52 AM Prudencio Pair wrote: Reason for CRM: Patient called stating that her medication refills were denied and she wants to know why. Looks like refill requests has already been submitted for the medications, Methylphenidate HCL 18 mg & Lorazepam 0.5 mg. Patient would like for nurse to give her a call. CB #: [id number].  Attempted to call pt, no answer and voicemail box was full.    Refills for Methylphenidate HCL and [facility] [place] [facility]  There was a duplicate refill for Lorazepam it was refused because it was a duplicate.

## 2023-07-04 NOTE — Telephone Encounter (Signed)
Attempted to contact pt. No answer, vm box full.   Need to notify pt Dr Reece Agar sent refills for both methylphenidate and lorazepam were sent to [facility] today.  There was a duplicate refill request for lorazepam, which is was refused.

## 2023-07-05 NOTE — Telephone Encounter (Signed)
Spoke with pt notifying her Dr Reece Agar sent refills for both meds to [facility] yesterday.  There was a duplicate refill request for lorazepam, which is was refused. Pt verbalizes understanding and expresses her thanks.

## 2023-07-21 ENCOUNTER — Other Ambulatory Visit: Payer: Self-pay | Admitting: Family Medicine

## 2023-07-22 NOTE — Telephone Encounter (Signed)
 Patient was last seen on June 2024 and was told to f/u in 3 months and has failed to do so; please call patient to schedule appt with Dr. Reece Agar.

## 2023-07-22 NOTE — Telephone Encounter (Signed)
 Refill request for metoprolol succinate (TOPROL-XL) 25 MG 24 hr tablet   LOV - 01/11/23 Next OV - not scheduled; message sent to scheduling Last refill - 01/11/23 #30/6

## 2023-07-22 NOTE — Telephone Encounter (Signed)
 Spoke to pt, scheduled ov for 07/30/23

## 2023-07-23 NOTE — Telephone Encounter (Signed)
 ERx

## 2023-07-30 ENCOUNTER — Ambulatory Visit: Payer: 59 | Admitting: Family Medicine

## 2023-08-07 ENCOUNTER — Encounter: Payer: Self-pay | Admitting: Family Medicine

## 2023-08-07 ENCOUNTER — Ambulatory Visit (INDEPENDENT_AMBULATORY_CARE_PROVIDER_SITE_OTHER): Payer: 59 | Admitting: Family Medicine

## 2023-08-07 VITALS — BP 148/92 | HR 92 | Temp 97.7°F | Ht 65.0 in | Wt 239.0 lb

## 2023-08-07 DIAGNOSIS — I10 Essential (primary) hypertension: Secondary | ICD-10-CM | POA: Diagnosis not present

## 2023-08-07 DIAGNOSIS — L501 Idiopathic urticaria: Secondary | ICD-10-CM | POA: Diagnosis not present

## 2023-08-07 DIAGNOSIS — F902 Attention-deficit hyperactivity disorder, combined type: Secondary | ICD-10-CM | POA: Diagnosis not present

## 2023-08-07 DIAGNOSIS — F418 Other specified anxiety disorders: Secondary | ICD-10-CM

## 2023-08-07 NOTE — Patient Instructions (Addendum)
Recommend whole food plant based diet. Google "forks over knives" diet.  BP is staying too high - let me know if any trouble tolerating metoprolol for change in medication. Take immediately after dinner.  Continue amlodipine 5mg  but take daily in the morning.  Let me know how you're doing. Return for physical and fasting labs.

## 2023-08-07 NOTE — Assessment & Plan Note (Addendum)
Chronic, continue PRN lorazepam.  PHQ9/GAD7 stable.

## 2023-08-07 NOTE — Assessment & Plan Note (Signed)
Continue concerta 18mg  daily PRN.

## 2023-08-07 NOTE — Assessment & Plan Note (Signed)
Chronic, BP above goal but she has not taken Topol XL any day this week. Continue amlodipine 5mg  daily (change to morning dosing), recommended restart Toprol XL 25mg  nightly and let me know if any troubled tolerating beta blocker.  No other changes made.

## 2023-08-07 NOTE — Assessment & Plan Note (Addendum)
Chronic, followed by allergist. Notes doing better in colder weather - last Xolair injection was 04/2023.

## 2023-08-07 NOTE — Assessment & Plan Note (Signed)
Recommended whole food plant based diet - rec look into "forks over knives" diet.

## 2023-08-07 NOTE — Progress Notes (Signed)
Ph: [phone] Fax: [phone]   Patient ID: Theresa Pratt, female    DOB: Jun 14, 1971, 53 y.o.   MRN: [id number]  This visit was conducted in person.  BP (!) 148/92 (BP Location: Right Arm, Cuff Size: Large)   Pulse 92   Temp 97.7 F (36.5 C) (Oral)   Ht 5\' 5"  (1.651 m)   Wt 239 lb (108.4 kg)   SpO2 94%   BMI 39.77 kg/m   BP Readings from Last 3 Encounters:  08/07/23 (!) 148/92  01/13/23 117/70  01/11/23 (!) 136/100   CC: HTN f/u visit  Subjective:   HPI: Theresa Pratt is a 53 y.o. female presenting on 08/07/2023 for Medical Management of Chronic Issues (Here for HTN f/u.)   Last seen 12/2022.  Several recent missed appointments including physical appt.  Looking to retire next year.   She's cut out colored sodas as well as cutting down on red meat.   HTN - Compliant with current antihypertensive regimen of toprol XL 25mg  daily, amlodipine 5mg  daily. Has not taken metoprolol all this week. She notes Toprol XL causes fatigue and next am somnolence. Does check blood pressures at home: overall ok. No low blood pressure readings or symptoms of dizziness/syncope. Denies HA, vision changes, CP/tightness, SOB, leg swelling.  Wonders if Toprol XL may be causing some hives and lip swelling. She did shovel snow this morning.   ADHD - continues Concerta 18mg  daily - wonders if this could contribute to hypertension. Otherwise tolerating well without headache, chest pain, insomnia, anorexia, tics or mood changes.   Last Xolair treatment 04/2023 - she notes she does better in colder weather.      Relevant past medical, surgical, family and social history reviewed and updated as indicated. Interim medical history since our last visit reviewed. Allergies and medications reviewed and updated. Outpatient Medications Prior to Visit  Medication Sig Dispense Refill   amLODipine (NORVASC) 5 MG tablet Take 1 tablet (5 mg total) by mouth daily. 30 tablet 6   ascorbic acid  (VITAMIN C) 500 MG tablet Take 1 tablet (500 mg total) by mouth daily.     Calcium Magnesium Zinc 333-133-5 MG TABS Take 1 tablet by mouth daily.     Cholecalciferol (VITAMIN D3) 1.25 MG (50000 UT) CAPS TAKE 1 CAPSULE BY MOUTH ONCE A WEEK 12 capsule 3   cyanocobalamin (,VITAMIN B-12,) 1000 MCG/ML injection Inject 1 mL (1,000 mcg total) into the muscle every 30 (thirty) days. 3 mL 2   cycloSPORINE modified (NEORAL) 50 MG capsule Take 50 mg by mouth 2 (two) times daily.     EPINEPHrine 0.3 mg/0.3 mL IJ SOAJ injection      famotidine (PEPCID) 20 MG tablet Take 1 tablet (20 mg total) by mouth 2 (two) times daily. 180 tablet 1   ferrous sulfate (SLOW RELEASE IRON) 160 (50 Fe) MG TBCR SR tablet Take 1 tablet (160 mg total) by mouth daily.     fexofenadine (ALLEGRA ALLERGY) 180 MG tablet Take 2 tablets (360 mg total) by mouth in the morning and at bedtime.     hydrOXYzine (ATARAX) 10 MG tablet Take 1 tablet (10 mg total) by mouth daily as needed for itching (hives). 90 tablet 2   Iron-Vitamins (GERITOL PO) Take 1 tablet by mouth daily.     LORazepam (ATIVAN) 0.5 MG tablet TAKE 1 TABLET BY MOUTH TWICE A DAY AS NEEDED FOR ANXIETY Strength: 0.5 mg 30 tablet 0   methylphenidate (CONCERTA) 18 MG PO CR  tablet Take 1 tablet (18 mg total) by mouth daily. 30 tablet 0   metoprolol succinate (TOPROL-XL) 25 MG 24 hr tablet TAKE 1 TABLET BY MOUTH DAILY. TAKE WITH OR IMMEDIATELY FOLLOWING A MEAL. 90 tablet 0   montelukast (SINGULAIR) 10 MG tablet Take 1 tablet (10 mg total) by mouth at bedtime. 30 tablet 11   Multiple Vitamin (MULTIVITAMIN) tablet Take 1 tablet by mouth daily.     Syringe/Needle, Disp, (SYRINGE 3CC/25GX1") 25G X 1" 3 ML MISC Use with B12 injection once monthly 1 each 6   thiamine 100 MG tablet Take 1 tablet (100 mg total) by mouth daily.     XOLAIR 150 MG/ML prefilled syringe Inject 300 mg into the skin every 14 (fourteen) days.     benzonatate (TESSALON) 200 MG capsule Take 1 capsule (200 mg total)  by mouth 3 (three) times daily as needed for cough. 15 capsule 0   Imiquimod 3.75 % CREA Apply to affected area 2-3 times weekly for 6 weeks 7.5 g 0   predniSONE (STERAPRED UNI-PAK 21 TAB) 10 MG (21) TBPK tablet Take 6 tablets on day 1, 5 tablets day 2, 4 tablets day 3, 3 tablets day 4, 2 tablets day 5, 1 tablet day 6 21 tablet 0   triamcinolone (KENALOG) 0.025 % ointment Apply 1 Application topically 2 (two) times daily. 30 g 0   No facility-administered medications prior to visit.     Per HPI unless specifically indicated in ROS section below Review of Systems  Objective:  BP (!) 148/92 (BP Location: Right Arm, Cuff Size: Large)   Pulse 92   Temp 97.7 F (36.5 C) (Oral)   Ht 5\' 5"  (1.651 m)   Wt 239 lb (108.4 kg)   SpO2 94%   BMI 39.77 kg/m   Wt Readings from Last 3 Encounters:  08/07/23 239 lb (108.4 kg)  01/11/23 242 lb 2 oz (109.8 kg)  11/14/22 241 lb (109.3 kg)      Physical Exam Vitals and nursing note reviewed.  Constitutional:      Appearance: Normal appearance. She is not ill-appearing.  HENT:     Mouth/Throat:     Mouth: Mucous membranes are moist.     Pharynx: Oropharynx is clear. No oropharyngeal exudate or posterior oropharyngeal erythema.  Eyes:     Extraocular Movements: Extraocular movements intact.     Pupils: Pupils are equal, round, and reactive to light.  Cardiovascular:     Rate and Rhythm: Normal rate and regular rhythm.     Pulses: Normal pulses.     Heart sounds: Normal heart sounds. No murmur heard. Pulmonary:     Effort: Pulmonary effort is normal. No respiratory distress.     Breath sounds: Normal breath sounds. No wheezing, rhonchi or rales.  Musculoskeletal:     Right lower leg: No edema.     Left lower leg: No edema.  Skin:    General: Skin is warm and dry.     Findings: No rash.  Neurological:     Mental Status: She is alert.  Psychiatric:        Mood and Affect: Mood normal.        Behavior: Behavior normal.       Results for  orders placed or performed in visit on 01/11/23  Sedimentation rate   Collection Time: 01/11/23 10:09 AM  Result Value Ref Range   Sed Rate 14 0 - 30 mm/hr  Rheumatoid factor   Collection Time: 01/11/23 10:09 AM  Result Value Ref Range   Rheumatoid fact SerPl-aCnc <10 <14 IU/mL      08/07/2023    3:24 PM 01/11/2023    9:33 AM 10/10/2022    8:14 AM 07/10/2022   12:10 PM 07/07/2021   11:46 AM  Depression screen PHQ 2/9  Decreased Interest 1 0 2 2 2   Down, Depressed, Hopeless 1 0 1 2 1   PHQ - 2 Score 2 0 3 4 3   Altered sleeping 2 0 0 2 0  Tired, decreased energy 0 1 2 2 1   Change in appetite 0 1 1 2  0  Feeling bad or failure about yourself  0 0 1 2 1   Trouble concentrating 1 1 1 1 1   Moving slowly or fidgety/restless 1 0 1 1 1   Suicidal thoughts 0 0 0 0 0  PHQ-9 Score 6 3 9 14 7   Difficult doing work/chores Somewhat difficult Somewhat difficult Somewhat difficult Very difficult        08/07/2023    3:24 PM 01/11/2023    9:33 AM 10/10/2022    8:14 AM 07/10/2022   12:10 PM  GAD 7 : Generalized Anxiety Score  Nervous, Anxious, on Edge 0 0 1 3  Control/stop worrying 1 0 0 2  Worry too much - different things 1 1 1 2   Trouble relaxing 1 0 0 2  Restless 0 0 0 0  Easily annoyed or irritable 1 1 1 3   Afraid - awful might happen 0 1 0 2  Total GAD 7 Score 4 3 3 14   Anxiety Difficulty Somewhat difficult Somewhat difficult Very difficult Very difficult   Assessment & Plan:   Problem List Items Addressed This Visit     Essential hypertension - Primary   Chronic, BP above goal but she has not taken Topol XL any day this week. Continue amlodipine 5mg  daily (change to morning dosing), recommended restart Toprol XL 25mg  nightly and let me know if any troubled tolerating beta blocker.  No other changes made.       Anxiety with depression   Chronic, continue PRN lorazepam.  PHQ9/GAD7 stable.       Chronic idiopathic urticaria   Chronic, followed by allergist. Notes doing better  in colder weather - last Xolair injection was 04/2023.       Attention deficit hyperactivity disorder (ADHD), combined type   Continue concerta 18mg  daily PRN.      Severe obesity (BMI 35.0-39.9) with comorbidity (HCC)   Recommended whole food plant based diet - rec look into "forks over knives" diet.         No orders of the defined types were placed in this encounter.   No orders of the defined types were placed in this encounter.   Patient Instructions  Recommend whole food plant based diet. Google "forks over knives" diet.  BP is staying too high - let me know if any trouble tolerating metoprolol for change in medication. Take immediately after dinner.  Continue amlodipine 5mg  but take daily in the morning.  Let me know how you're doing. Return for physical and fasting labs.   Follow up plan: Return if symptoms worsen or fail to improve.  Eustaquio Boyden, MD

## 2023-08-13 ENCOUNTER — Other Ambulatory Visit: Payer: Self-pay | Admitting: Family Medicine

## 2023-08-13 DIAGNOSIS — F902 Attention-deficit hyperactivity disorder, combined type: Secondary | ICD-10-CM

## 2023-08-13 DIAGNOSIS — F418 Other specified anxiety disorders: Secondary | ICD-10-CM

## 2023-08-13 NOTE — Telephone Encounter (Addendum)
Name of Medication:  Lorazepam, Concerta Name of Pharmacy:  [facility] Last Fill or Written Date and Quantity:  07/04/23      Lorazepam:  #30      Concerta:  #30 Last Office Visit and Type:  08/07/23, HTN f/u Next Office Visit and Type:  none Last Controlled Substance Agreement Date:  10/30/17 Last UDS:  10/30/17

## 2023-08-15 MED ORDER — METHYLPHENIDATE HCL ER (OSM) 18 MG PO TBCR: 18.0000 mg | EXTENDED_RELEASE_TABLET | Freq: Every day | ORAL | 0 refills | Status: DC

## 2023-08-15 NOTE — Telephone Encounter (Signed)
ERx

## 2023-08-18 ENCOUNTER — Ambulatory Visit
Admission: EM | Admit: 2023-08-18 | Discharge: 2023-08-18 | Disposition: A | Discharge status: Home / Self Care | Payer: 59 | Attending: Physician Assistant | Admitting: Physician Assistant

## 2023-08-18 ENCOUNTER — Encounter: Payer: Self-pay | Admitting: Emergency Medicine

## 2023-08-18 DIAGNOSIS — H6121 Impacted cerumen, right ear: Secondary | ICD-10-CM | POA: Diagnosis not present

## 2023-08-18 DIAGNOSIS — R21 Rash and other nonspecific skin eruption: Secondary | ICD-10-CM

## 2023-08-18 MED ORDER — TRIAMCINOLONE ACETONIDE 0.1 % EX CREA: 1.0000 | TOPICAL_CREAM | Freq: Two times a day (BID) | CUTANEOUS | 0 refills | Status: AC

## 2023-08-18 NOTE — ED Triage Notes (Signed)
Pt c/o right ear hearing loss and tenderness. She also has a runny nose. Started about a week. She states she has tenderness on the outside and it is "crusty".

## 2023-08-18 NOTE — ED Provider Notes (Signed)
[facility]    CSN: [id number] Arrival date & time: 08/18/23  0905      History   Chief Complaint Chief Complaint  Patient presents with   Hearing Problem    right    HPI Theresa Pratt is a 53 y.o. female presenting for right ear fullness and reduced hearing for the past several days.  Also reports runny nose but does have a history of allergies but this is not necessarily abnormal.  Denies fever, fatigue, cough, sore throat, sinus pain.  Has been using a nasal spray without relief.  She says that normally relieves any ear fullness.  She believes she may have earwax buildup.  HPI  Past Medical History:  Diagnosis Date   ADHD, adult residual type    Allergic rhinitis    post gastric bypass   Allergy    Allergy status to other drugs, medicaments and biological substances 12/22/2021   Anxiety    Arthropathy    weight bearing (pre gastric bypass)   Chronic urticaria    Diarrhea, functional    DM (diabetes mellitus) (HCC)    pre gastric bypass, pt states she has not been treated for it in years, her doctor doesn't discuss it with her anymore   GERD (gastroesophageal reflux disease)    post gastric bypass   Heart murmur    had it in the past   History of chicken pox    History of open reduction and internal fixation (ORIF) procedure 02/21/2017   HTN (hypertension)    Pre gastric bypass   HTN (hypertension)    post gastric bypass   IDA (iron deficiency anemia)    post gastric bypass (iron infusion by hematology-doesn't build stores -Dr. 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   Oligomenorrhea    on Implanon (pre gastric bypass)   PONV (postoperative nausea and vomiting)    after 1st surgery   Postpartum depression    post gastric bypass   Pre-eclampsia    post gastric bypass   Restless legs     Patient Active Problem List   Diagnosis Date Noted   Arthralgia of right hand 01/12/2023   Dyspareunia  in female 10/11/2022   Polyp of cecum 08/24/2022   Encounter for general adult medical examination with abnormal findings 07/10/2022   Immunization deficiency 06/08/2020   Epidermal cyst 04/26/2020   Thiamine deficiency 10/22/2019   Severe obesity (BMI 35.0-39.9) with comorbidity (HCC) 06/03/2018   Displaced fracture of lateral malleolus of left fibula, initial encounter for closed fracture 02/19/2017   Vitamin B12 deficiency 12/21/2016   Attention deficit hyperactivity disorder (ADHD), 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   Chronic idiopathic urticaria 03/05/2016   PTSD (post-traumatic stress disorder) 02/28/2016   Anxiety with depression 06/29/2015   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   BACK PAIN, LUMBAR 07/14/2010   Vitamin D deficiency 07/03/2010   Allergic rhinitis 07/03/2010   GERD 07/03/2010   Essential hypertension 06/22/2010    Past Surgical History:  Procedure Laterality Date   CHOLECYSTECTOMY  08/2010   COLONOSCOPY WITH PROPOFOL N/A 08/24/2022   TAx1, rpt 7 yrs (Vanga, Loel Dubonnet, MD)   FRACTURE SURGERY     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 Authorizing Provider  amLODipine (NORVASC) 5 MG tablet Take 1 tablet (5 mg total) by mouth daily. 01/11/23  Yes Eustaquio Boyden, MD  ascorbic acid (VITAMIN C) 500 MG tablet Take 1 tablet (500 mg  total) by mouth daily. 06/08/20  Yes Eustaquio Boyden, MD  Calcium Magnesium Zinc 333-133-5 MG TABS Take 1 tablet by mouth daily. 06/08/20  Yes Eustaquio Boyden, MD  Cholecalciferol (VITAMIN D3) 1.25 MG (50000 UT) CAPS TAKE 1 CAPSULE BY MOUTH ONCE A WEEK 06/11/23  Yes Eustaquio Boyden, MD  cyanocobalamin (,VITAMIN B-12,) 1000 MCG/ML injection Inject 1 mL (1,000 mcg total) into the muscle every 30 (thirty) days. 10/10/21  Yes Eustaquio Boyden, MD  famotidine (PEPCID) 20 MG tablet Take 1 tablet (20 mg total) by mouth 2 (two) times daily. 12/03/20  Yes Cook, Jayce G, DO  ferrous sulfate (SLOW RELEASE IRON) 160 (50 Fe) MG TBCR SR tablet Take 1 tablet (160 mg total) by mouth daily. 07/10/22  Yes Eustaquio Boyden, MD  fexofenadine [facility] ALLERGY) 180 MG tablet Take 2 tablets (360 mg total) by mouth in the morning and at bedtime. 10/11/22  Yes Eustaquio Boyden, MD  methylphenidate (CONCERTA) 18 MG PO CR tablet Take 1 tablet (18 mg total) by mouth daily. 08/15/23  Yes Eustaquio Boyden, MD  metoprolol succinate (TOPROL-XL) 25 MG 24 hr tablet TAKE 1 TABLET BY MOUTH DAILY. TAKE WITH OR IMMEDIATELY FOLLOWING A MEAL. 07/23/23  Yes Eustaquio Boyden, MD  montelukast (SINGULAIR) 10 MG tablet Take 1 tablet (10 mg total) by mouth at bedtime. 05/04/22  Yes Eustaquio Boyden, MD  thiamine 100 MG tablet Take 1 tablet (100 mg total) by mouth daily. 11/03/19  Yes Eustaquio Boyden, MD  triamcinolone cream (KENALOG) 0.1 % Apply 1 Application topically 2 (two) times daily. 08/18/23  Yes Eusebio Friendly B, PA-C  cycloSPORINE modified (NEORAL) 50 MG capsule Take 50 mg by mouth 2 (two) times daily.    [provider]  EPINEPHrine 0.3 mg/0.3 mL IJ SOAJ injection  04/16/22   [provider]  hydrOXYzine (ATARAX) 10 MG tablet Take 1 tablet (10 mg total) by mouth daily as needed for itching (hives). 12/16/21   Becky Augusta, NP  Iron-Vitamins (GERITOL PO) Take 1 tablet by mouth daily.    [provider]   LORazepam (ATIVAN) 0.5 MG tablet TAKE 1 TABLET BY MOUTH TWICE A DAY AS NEEDED FOR ANXIETY STRENGTH: 0.5 MG 08/15/23   Eustaquio Boyden, MD  Multiple Vitamin (MULTIVITAMIN) tablet Take 1 tablet by mouth daily.    [provider]  Syringe/Needle, Disp, (SYRINGE 3CC/25GX1") 25G X 1" 3 ML MISC Use with B12 injection once monthly 01/27/21   Eustaquio Boyden, MD  Geoffry Paradise 150 MG/ML prefilled syringe Inject 300 mg into the skin every 14 (fourteen) days. 10/11/22  Eustaquio Boyden, MD    Family History Family History  Problem Relation Age of Onset   Coronary artery disease Father        2 stents; + smoker   Hypertension Father    Thyroid disease Brother    Diabetes Maternal Grandmother    Cirrhosis Paternal Grandmother    Alcohol abuse Paternal Grandmother    Breast cancer Other        Paternal great aunt(obesity, smoker, EtOHic)   Breast cancer Other        Maternal great aunt (deceased in 50's from same)   Thyroid disease Other        cousins and grandmother   Cancer Other        great aunt - lung   Liver disease Paternal Aunt    Cancer Cousin        liver, bile duct   Cancer Mother        uterine   Uterine cancer Mother    Colon polyps Neg Hx    Colon cancer Neg Hx    Esophageal cancer Neg Hx    Rectal cancer Neg Hx    Stomach cancer Neg Hx     Social History Social History   Tobacco Use   Smoking status: Never   Smokeless tobacco: Never  Vaping Use   Vaping status: Never Used  Substance Use Topics   Alcohol use: Yes    Comment: occasional   Drug use: No     Allergies   Focalin [dexmethylphenidate], Sertraline, and Sulfa antibiotics   Review of Systems Review of Systems  Constitutional:  Negative for chills, diaphoresis, fatigue and fever.  HENT:  Positive for congestion, hearing loss and rhinorrhea. Negative for ear discharge, ear pain, sinus pressure, sinus pain and sore throat.   Respiratory:  Negative for cough and shortness of breath.    Gastrointestinal:  Negative for abdominal pain, nausea and vomiting.  Musculoskeletal:  Negative for arthralgias and myalgias.  Skin:  Negative for rash.  Neurological:  Negative for weakness and headaches.  Hematological:  Negative for adenopathy.     Physical Exam Triage Vital Signs ED Triage Vitals  Encounter Vitals Group     BP 08/18/23 1036 (!) 146/93     Systolic BP Percentile --      Diastolic BP Percentile --      Pulse Rate 08/18/23 1036 79     Resp 08/18/23 1036 16     Temp 08/18/23 1036 97.8 F (36.6 C)     Temp Source 08/18/23 1036 Oral     SpO2 08/18/23 1036 96 %     Weight 08/18/23 1034 238 lb 15.7 oz (108.4 kg)     Height 08/18/23 1034 5\' 5"  (1.651 m)     Head Circumference --      Peak Flow --      Pain Score 08/18/23 1033 7     Pain Loc --      Pain Education --      Exclude from Growth Chart --    No data found.  Updated Vital Signs BP (!) 146/93 (BP Location: Left Arm)   Pulse 79   Temp 97.8 F (36.6 C) (Oral)   Resp 16   Ht 5\' 5"  (1.651 m)   Wt 238 lb 15.7 oz (108.4 kg)   SpO2 96%   BMI 39.77 kg/m      Physical Exam Vitals and nursing note reviewed.  Constitutional:      General: She  is not in acute distress.    Appearance: Normal appearance. She is not ill-appearing or toxic-appearing.  HENT:     Head: Normocephalic and atraumatic.     Right Ear: External ear normal. There is impacted cerumen.     Left Ear: Tympanic membrane and ear canal normal.     Nose: Nose normal.     Mouth/Throat:     Mouth: Mucous membranes are moist.     Pharynx: Oropharynx is clear.  Eyes:     General: No scleral icterus.       Right eye: No discharge.        Left eye: No discharge.     Conjunctiva/sclera: Conjunctivae normal.  Cardiovascular:     Rate and Rhythm: Normal rate and regular rhythm.     Heart sounds: Normal heart sounds.  Pulmonary:     Effort: Pulmonary effort is normal. No respiratory distress.     Breath sounds: Normal breath sounds.   Musculoskeletal:     Cervical back: Neck supple.  Skin:    General: Skin is dry.     Findings: Rash (cracked skin and rash of left external ear) present.  Neurological:     General: No focal deficit present.     Mental Status: She is alert. Mental status is at baseline.     Motor: No weakness.     Gait: Gait normal.  Psychiatric:        Mood and Affect: Mood normal.        Behavior: Behavior normal.      UC Treatments / Results  Labs (all labs ordered are listed, but only abnormal results are displayed) Labs Reviewed - No data to display  EKG   Radiology No results found.  Procedures Procedures (including critical care time)  Medications Ordered in UC Medications - No data to display  Initial Impression / Assessment and Plan / UC Course  I have reviewed the triage vital signs and the nursing notes.  Pertinent labs & imaging results that were available during my care of the patient were reviewed by me and considered in my medical decision making (see chart for details).   53 year old female presents for right ear fullness and reduced hearing as well as nasal congestion and runny nose for the past couple days.  Has tried a nasal spray without relief.  On exam she has a ball of wax blocking view of right TM.  Discussed performing otic lavage.  Patient is agreeable.  Otic lavage successful.  Patient reported relief of symptoms.  Additionally she has a rash of the left external ear which is consistent with dermatitis.  Will treat that with triamcinolone topical cream.  Sent to pharmacy.  Reviewed supportive care.  Reviewed return precautions.   Final Clinical Impressions(s) / UC Diagnoses   Final diagnoses:  Hearing loss due to cerumen impaction, right  Rash and nonspecific skin eruption   Discharge Instructions   None    ED Prescriptions     Medication Sig Dispense Auth. Provider   triamcinolone cream (KENALOG) 0.1 % Apply 1 Application topically 2 (two) times  daily. 30 g Gareth Morgan      PDMP not reviewed this encounter.   Shirlee Latch, PA-C 08/18/23 1237

## 2023-09-18 ENCOUNTER — Other Ambulatory Visit: Payer: Self-pay | Admitting: Family Medicine

## 2023-09-18 DIAGNOSIS — F418 Other specified anxiety disorders: Secondary | ICD-10-CM

## 2023-09-18 DIAGNOSIS — F902 Attention-deficit hyperactivity disorder, combined type: Secondary | ICD-10-CM

## 2023-09-18 NOTE — Telephone Encounter (Signed)
 Last Fill: 08/15/23 30 tabs/0 RF  Last OV: 08/07/23 Next OV: None Scheduled  Routing to provider for review/authorization.

## 2023-09-18 NOTE — Telephone Encounter (Signed)
 Copied from CRM [id number]. Topic: Clinical - Medication Refill >> Sep 18, 2023  3:48 PM Pascal Lux wrote: Most Recent Primary Care Visit:  Provider: Eustaquio Boyden  Department: [facility]  Visit Type: OFFICE VISIT  Date: 08/07/2023  Medication: methylphenidate (CONCERTA) 18 MG PO CR tablet [[id number]]  Has the patient contacted their pharmacy? Yes (Agent: If no, request that the patient contact the pharmacy for the refill. If patient does not wish to contact the pharmacy document the reason why and proceed with request.) (Agent: If yes, when and what did the pharmacy advise?) advised to call provider  Is this the correct pharmacy for this prescription? Yes If no, delete pharmacy and type the correct one.  This is the patient's preferred pharmacy:  [phone] [place] - [address] [place] [address] Phone: [phone] Fax: [phone]   Has the prescription been filled recently? Yes  Is the patient out of the medication? Yes  Has the patient been seen for an appointment in the last year OR does the patient have an upcoming appointment? Yes  Can we respond through MyChart? No  Agent: Please be advised that Rx refills may take up to 3 business days. We ask that you follow-up with your pharmacy.

## 2023-09-19 NOTE — Telephone Encounter (Signed)
 ERx

## 2023-09-20 MED ORDER — METHYLPHENIDATE HCL ER (OSM) 18 MG PO TBCR: 18.0000 mg | EXTENDED_RELEASE_TABLET | Freq: Every day | ORAL | 0 refills | Status: DC

## 2023-09-20 NOTE — Telephone Encounter (Signed)
 ERx

## 2023-10-14 ENCOUNTER — Encounter: Payer: Self-pay | Admitting: Family Medicine

## 2023-10-14 ENCOUNTER — Ambulatory Visit (INDEPENDENT_AMBULATORY_CARE_PROVIDER_SITE_OTHER): Admitting: Family Medicine

## 2023-10-14 ENCOUNTER — Other Ambulatory Visit

## 2023-10-14 DIAGNOSIS — E559 Vitamin D deficiency, unspecified: Secondary | ICD-10-CM

## 2023-10-14 DIAGNOSIS — Z6841 Body Mass Index (BMI) 40.0 and over, adult: Secondary | ICD-10-CM

## 2023-10-14 DIAGNOSIS — E538 Deficiency of other specified B group vitamins: Secondary | ICD-10-CM

## 2023-10-14 DIAGNOSIS — Z9884 Bariatric surgery status: Secondary | ICD-10-CM

## 2023-10-14 DIAGNOSIS — R0683 Snoring: Secondary | ICD-10-CM | POA: Insufficient documentation

## 2023-10-14 DIAGNOSIS — R400 Somnolence: Secondary | ICD-10-CM

## 2023-10-14 LAB — COMPREHENSIVE METABOLIC PANEL WITH GFR
ALT: 15 U/L (ref 0–35)
AST: 15 U/L (ref 0–37)
Albumin: 4.1 g/dL (ref 3.5–5.2)
Alkaline Phosphatase: 116 U/L (ref 39–117)
BUN: 16 mg/dL (ref 6–23)
CO2: 29 meq/L (ref 19–32)
Calcium: 9 mg/dL (ref 8.4–10.5)
Chloride: 105 meq/L (ref 96–112)
Creatinine, Ser: 0.75 mg/dL (ref 0.40–1.20)
GFR: 90 mL/min (ref >60.00)
Glucose, Bld: 98 mg/dL (ref 70–99)
Potassium: 4.4 meq/L (ref 3.5–5.1)
Sodium: 141 meq/L (ref 135–145)
Total Bilirubin: 0.5 mg/dL (ref 0.2–1.2)
Total Protein: 7 g/dL (ref 6.0–8.3)

## 2023-10-14 LAB — LIPID PANEL
Cholesterol: 178 mg/dL (ref 0–200)
HDL: 56.1 mg/dL (ref >39.00)
LDL Cholesterol: 95 mg/dL (ref 0–99)
NonHDL: 122.39
Total CHOL/HDL Ratio: 3
Triglycerides: 139 mg/dL (ref 0.0–149.0)
VLDL: 27.8 mg/dL (ref 0.0–40.0)

## 2023-10-14 LAB — IBC PANEL
Iron: 68 ug/dL (ref 42–145)
Saturation Ratios: 17 % — ABNORMAL LOW (ref 20.0–50.0)
TIBC: 400.4 ug/dL (ref 250.0–450.0)
Transferrin: 286 mg/dL (ref 212.0–360.0)

## 2023-10-14 LAB — CBC WITH DIFFERENTIAL/PLATELET
Basophils Absolute: 0 10*3/uL (ref 0.0–0.1)
Basophils Relative: 0.1 % (ref 0.0–3.0)
Eosinophils Absolute: 0.3 10*3/uL (ref 0.0–0.7)
Eosinophils Relative: 3.8 % (ref 0.0–5.0)
HCT: 38.7 % (ref 36.0–46.0)
Hemoglobin: 12.2 g/dL (ref 12.0–15.0)
Lymphocytes Relative: 13.7 % (ref 12.0–46.0)
Lymphs Abs: 1 10*3/uL (ref 0.7–4.0)
MCHC: 31.7 g/dL (ref 30.0–36.0)
MCV: 92.8 fl (ref 78.0–100.0)
Monocytes Absolute: 0.5 10*3/uL (ref 0.1–1.0)
Monocytes Relative: 7.4 % (ref 3.0–12.0)
Neutro Abs: 5.4 10*3/uL (ref 1.4–7.7)
Neutrophils Relative %: 75 % (ref 43.0–77.0)
Platelets: 313 10*3/uL (ref 150.0–400.0)
RBC: 4.17 Mil/uL (ref 3.87–5.11)
RDW: 14.3 % (ref 11.5–15.5)
WBC: 7.2 10*3/uL (ref 4.0–10.5)

## 2023-10-14 LAB — VITAMIN D 25 HYDROXY (VIT D DEFICIENCY, FRACTURES): VITD: 50.36 ng/mL (ref 30.00–100.00)

## 2023-10-14 LAB — FOLATE: Folate: 24.4 ng/mL (ref >5.9)

## 2023-10-14 LAB — TSH: TSH: 1.95 u[IU]/mL (ref 0.35–5.50)

## 2023-10-14 LAB — FERRITIN: Ferritin: 17 ng/mL (ref 10.0–291.0)

## 2023-10-14 LAB — VITAMIN B12: Vitamin B-12: 440 pg/mL (ref 211–911)

## 2023-10-14 MED ORDER — WEGOVY 0.25 MG/0.5ML ~~LOC~~ SOAJ: 0.2500 mg | SUBCUTANEOUS | 0 refills | Status: DC

## 2023-10-14 MED ORDER — WEGOVY 0.5 MG/0.5ML ~~LOC~~ SOAJ: 0.5000 mg | SUBCUTANEOUS | 2 refills | Status: DC

## 2023-10-14 NOTE — Assessment & Plan Note (Signed)
 She may be good candidate for bariatric medication - would prefer [facility] given s/p roux-en-y gastric bypass surgery. Patient is interested in [place]. Reviewed mechanism of action of medication as well as side effects and adverse events to watch for including nausea, diarrhea, constipation, pancreatitis. No fmhx medullary thyroid cancer or MEN2. Discussed titration schedule for medication. Will start Wegovy 0.25mg  weekly x1 month then increase to 0.5mg  weekly. Discussed need for regular visits for weight management to monitor medication effect and tolerance and weight loss, rec return 1 month after starting medication.

## 2023-10-14 NOTE — Progress Notes (Signed)
 Ph: [phone] Fax: [phone]   Patient ID: Theresa Pratt, female    DOB: 1971/01/23, 53 y.o.   MRN: [id number]  This visit was conducted in person.  BP 122/84   Pulse 95   Temp 98.1 F (36.7 C) (Oral)   Resp (!) 96   Ht 5\' 5"  (1.651 m)   Wt 242 lb (109.8 kg)   BMI 40.27 kg/m    CC: discuss weight management Subjective:   HPI: Theresa Pratt is a 53 y.o. female presenting on 10/14/2023 for Medical Management of Chronic Issues (Here for wt mgmt f/u.)   Overdue for physical.  Stressful period with family illness - father with lung cancer, mother with facial fracture after syncope.   Last weight: 239 lbs  Today's weight 242 lbs   S/p gastric bypass (Roux-en-y 2003) - with subsequent weight loss ofr 150+ lbs but has regained some.   Last visit recommended forks over knives diet - whole food plant based.   No known fmhx medullary thyroid cancer or MEN2.  OSA - she feels this improved after bariatric surgery.  Notes mild snoring, no witnessed apnea, overall restorative sleep, some daytime somnolence later in the day.   She went to [facility] to receive IV treatment: B1, B12, magnesium       Relevant past medical, surgical, family and social history reviewed and updated as indicated. Interim medical history since our last visit reviewed. Allergies and medications reviewed and updated. Outpatient Medications Prior to Visit  Medication Sig Dispense Refill   amLODipine (NORVASC) 5 MG tablet Take 1 tablet (5 mg total) by mouth daily. 30 tablet 6   ascorbic acid (VITAMIN C) 500 MG tablet Take 1 tablet (500 mg total) by mouth daily.     Calcium Magnesium Zinc 333-133-5 MG TABS Take 1 tablet by mouth daily.     Cholecalciferol (VITAMIN D3) 1.25 MG (50000 UT) CAPS TAKE 1 CAPSULE BY MOUTH ONCE A WEEK 12 capsule 3   cycloSPORINE modified (NEORAL) 50 MG capsule Take 50 mg by mouth 2 (two) times daily.     EPINEPHrine 0.3 mg/0.3 mL IJ SOAJ injection       famotidine (PEPCID) 20 MG tablet Take 1 tablet (20 mg total) by mouth 2 (two) times daily. 180 tablet 1   ferrous sulfate (SLOW RELEASE IRON) 160 (50 Fe) MG TBCR SR tablet Take 1 tablet (160 mg total) by mouth daily.     fexofenadine (ALLEGRA ALLERGY) 180 MG tablet Take 2 tablets (360 mg total) by mouth in the morning and at bedtime.     hydrOXYzine (ATARAX) 10 MG tablet Take 1 tablet (10 mg total) by mouth daily as needed for itching (hives). 90 tablet 2   Iron-Vitamins (GERITOL PO) Take 1 tablet by mouth daily.     LORazepam (ATIVAN) 0.5 MG tablet TAKE 1 TABLET BY MOUTH TWICE A DAY AS NEEDED FOR ANXIETY STRENGTH: 0.5 MG 30 tablet 0   methylphenidate (CONCERTA) 18 MG PO CR tablet Take 1 tablet (18 mg total) by mouth daily. 30 tablet 0   metoprolol succinate (TOPROL-XL) 25 MG 24 hr tablet TAKE 1 TABLET BY MOUTH DAILY. TAKE WITH OR IMMEDIATELY FOLLOWING A MEAL. 90 tablet 0   montelukast (SINGULAIR) 10 MG tablet Take 1 tablet (10 mg total) by mouth at bedtime. 30 tablet 11   Multiple Vitamin (MULTIVITAMIN) tablet Take 1 tablet by mouth daily.     Syringe/Needle, Disp, (SYRINGE 3CC/25GX1") 25G X 1" 3 ML MISC Use with  B12 injection once monthly 1 each 6   thiamine 100 MG tablet Take 1 tablet (100 mg total) by mouth daily.     triamcinolone cream (KENALOG) 0.1 % Apply 1 Application topically 2 (two) times daily. 30 g 0   cyanocobalamin (,VITAMIN B-12,) 1000 MCG/ML injection Inject 1 mL (1,000 mcg total) into the muscle every 30 (thirty) days. (Patient not taking: Reported on 10/14/2023) 3 mL 2   XOLAIR 150 MG/ML prefilled syringe Inject 300 mg into the skin every 14 (fourteen) days. (Patient not taking: Reported on 10/14/2023)     No facility-administered medications prior to visit.     Per HPI unless specifically indicated in ROS section below Review of Systems  Objective:  BP 122/84   Pulse 95   Temp 98.1 F (36.7 C) (Oral)   Resp (!) 96   Ht 5\' 5"  (1.651 m)   Wt 242 lb (109.8 kg)   BMI  40.27 kg/m   Wt Readings from Last 3 Encounters:  10/14/23 242 lb (109.8 kg)  08/18/23 238 lb 15.7 oz (108.4 kg)  08/07/23 239 lb (108.4 kg)      Physical Exam Vitals and nursing note reviewed.  Constitutional:      Appearance: Normal appearance. She is not ill-appearing.  HENT:     Head: Normocephalic and atraumatic.     Mouth/Throat:     Mouth: Mucous membranes are moist.     Pharynx: Oropharynx is clear. No oropharyngeal exudate or posterior oropharyngeal erythema.  Eyes:     Extraocular Movements: Extraocular movements intact.     Pupils: Pupils are equal, round, and reactive to light.  Cardiovascular:     Rate and Rhythm: Normal rate and regular rhythm.     Pulses: Normal pulses.     Heart sounds: Normal heart sounds. No murmur heard. Pulmonary:     Effort: Pulmonary effort is normal. No respiratory distress.     Breath sounds: Normal breath sounds. No wheezing, rhonchi or rales.  Musculoskeletal:     Right lower leg: No edema.     Left lower leg: No edema.  Skin:    General: Skin is warm and dry.  Neurological:     Mental Status: She is alert.  Psychiatric:        Mood and Affect: Mood normal.        Behavior: Behavior normal.       Results for orders placed or performed in visit on 01/11/23  Sedimentation rate   Collection Time: 01/11/23 10:09 AM  Result Value Ref Range   Sed Rate 14 0 - 30 mm/hr  Rheumatoid factor   Collection Time: 01/11/23 10:09 AM  Result Value Ref Range   Rheumatoid fact SerPl-aCnc <10 <14 IU/mL    Assessment & Plan:   Problem List Items Addressed This Visit     Status post bariatric surgery (Chronic)   Update labs      Relevant Orders   Lipid panel   VITAMIN D 25 Hydroxy (Vit-D Deficiency, Fractures)   Vitamin B1   Comprehensive metabolic panel with GFR   TSH   Hemoglobin A1c   CBC with Differential/Platelet   Vitamin B12   Ferritin   IBC panel   Folate   Vitamin D deficiency   Relevant Orders   VITAMIN D 25  Hydroxy (Vit-D Deficiency, Fractures)   Vitamin B12 deficiency   Relevant Orders   Vitamin B12   Severe obesity (BMI 35.0-39.9) with comorbidity (HCC) - Primary   She may  be good candidate for bariatric medication - would prefer [facility] given s/p roux-en-y gastric bypass surgery. Patient is interested in [facility]. Reviewed mechanism of action of medication as well as side effects and adverse events to watch for including nausea, diarrhea, constipation, pancreatitis. No fmhx medullary thyroid cancer or MEN2. Discussed titration schedule for medication. Will start [facility] 0.25mg  weekly x1 month then increase to 0.5mg  weekly. Discussed need for regular visits for weight management to monitor medication effect and tolerance and weight loss, rec return 1 month after starting medication.        Relevant Medications   Semaglutide-Weight Management (WEGOVY) 0.25 MG/0.5ML SOAJ   Semaglutide-Weight Management (WEGOVY) 0.5 MG/0.5ML SOAJ (Start on 11/11/2023)   Snoring   Snoring with some daytime somnolence. ESS = 14. Will set up with HST.       Other Visit Diagnoses       Daytime somnolence            Meds ordered this encounter  Medications   Semaglutide-Weight Management (WEGOVY) 0.25 MG/0.5ML SOAJ    Sig: Inject 0.25 mg into the skin once a week.    Dispense:  2 mL    Refill:  0   Semaglutide-Weight Management (WEGOVY) 0.5 MG/0.5ML SOAJ    Sig: Inject 0.5 mg into the skin once a week.    Dispense:  2 mL    Refill:  2    Orders Placed This Encounter  Procedures   Lipid panel   VITAMIN D 25 Hydroxy (Vit-D Deficiency, Fractures)   Vitamin B1   Comprehensive metabolic panel with GFR   TSH   Hemoglobin A1c   CBC with Differential/Platelet   Vitamin B12   Ferritin   IBC panel   Folate    Patient Instructions  Schedule physical for summer.  Labs today  Check with insurance on coverage for weekly weight loss medicines Wegovy or Zepbound.  Sleep questionnaire today.   Follow up  plan: Return if symptoms worsen or fail to improve.  Eustaquio Boyden, MD

## 2023-10-14 NOTE — Assessment & Plan Note (Signed)
 Update labs.

## 2023-10-14 NOTE — Assessment & Plan Note (Signed)
 Snoring with some daytime somnolence. ESS = 14. Will set up with HST.

## 2023-10-14 NOTE — Patient Instructions (Addendum)
 Schedule physical for summer.  Labs today  Check with insurance on coverage for weekly weight loss medicines Wegovy or Zepbound.  Sleep questionnaire today.

## 2023-10-15 ENCOUNTER — Other Ambulatory Visit (HOSPITAL_COMMUNITY): Payer: Self-pay

## 2023-10-15 ENCOUNTER — Telehealth: Payer: Self-pay

## 2023-10-15 LAB — HEMOGLOBIN A1C
Est. average glucose Bld gHb Est-mCnc: 77 mg/dL
Hgb A1c MFr Bld: 4.3 % — ABNORMAL LOW (ref 4.8–5.6)

## 2023-10-15 NOTE — Telephone Encounter (Signed)
 Wegovy unaffordable - ok to cancel this. Will await OSA evaluation and if positive for this, will try prescribing Zepbound for OSA indication.  Please notify pt of above.

## 2023-10-15 NOTE — Telephone Encounter (Signed)
 Attempted to contact pt. Vm box full. Need to relay Dr Timoteo Expose message.

## 2023-10-15 NOTE — Telephone Encounter (Signed)
 Pharmacy Patient Advocate Encounter   Received notification from CoverMyMeds that prior authorization for Wegovy 0.25MG /0.5ML auto-injectors I   Insurance verification completed.   The patient is insured through [facility].  Action: Medication DOES NOT NEED PA TEST CLAIM SHOWED COST OF $1285.44

## 2023-10-16 NOTE — Telephone Encounter (Signed)
Spoke with pt relaying Dr. G's message. Pt verbalizes understanding.  

## 2023-10-17 LAB — VITAMIN B1: Vitamin B1 (Thiamine): 8 nmol/L (ref 8–30)

## 2023-10-18 ENCOUNTER — Encounter: Admitting: Family Medicine

## 2023-10-28 ENCOUNTER — Telehealth: Payer: Self-pay | Admitting: Family Medicine

## 2023-10-28 ENCOUNTER — Encounter: Payer: Self-pay | Admitting: Family Medicine

## 2023-10-28 DIAGNOSIS — D509 Iron deficiency anemia, unspecified: Secondary | ICD-10-CM | POA: Insufficient documentation

## 2023-10-28 NOTE — Telephone Encounter (Signed)
 On max tolerable oral iron dose Ferritin low normal, % sat low.  Will order iron infusion to [facility].

## 2023-10-29 ENCOUNTER — Telehealth: Payer: Self-pay | Admitting: Pharmacy Technician

## 2023-10-29 ENCOUNTER — Other Ambulatory Visit: Payer: Self-pay | Admitting: Family Medicine

## 2023-10-29 DIAGNOSIS — F902 Attention-deficit hyperactivity disorder, combined type: Secondary | ICD-10-CM

## 2023-10-29 NOTE — Telephone Encounter (Signed)
 Name of Medication:  Concerta Name of Pharmacy:  [facility] Last Fill or Written Date and Quantity:  09/20/23, #30 Last Office Visit and Type:  10/14/23, wt mgmt f/u Next Office Visit and Type:  none Last Controlled Substance Agreement Date:  10/30/17 Last UDS:  10/30/17

## 2023-10-29 NOTE — Telephone Encounter (Unsigned)
 Copied from CRM [id number]. Topic: Clinical - Medication Refill >> Oct 29, 2023 10:54 AM Rosamond Comes wrote: Patient calling requesting medication refill  Most Recent Primary Care Visit:  Provider: Claire Crick  Department: [facility]  Visit Type: OFFICE VISIT  Date: 10/14/2023  Medication: methylphenidate (CONCERTA) 18 MG PO CR tablet  Has the patient contacted their pharmacy? Yes (Agent: If no, request that the patient contact the pharmacy for the refill. If patient does not wish to contact the pharmacy document the reason why and proceed with request.) (Agent: If yes, when and what did the pharmacy advise?)  Is this the correct pharmacy for this prescription? Yes If no, delete pharmacy and type the correct one.  This is the patient's preferred pharmacy:   [phone], [place] - [address] [place] [address] Phone: [phone] Fax: [phone]   Has the prescription been filled recently? Yes  Is the patient out of the medication? Yes  Has the patient been seen for an appointment in the last year OR does the patient have an upcoming appointment? Yes  Can we respond through MyChart? No  Agent: Please be advised that Rx refills may take up to 3 business days. We ask that you follow-up with your pharmacy.

## 2023-10-29 NOTE — Telephone Encounter (Signed)
 Auth Submission: APPROVED Site of care: Site of care: [facility] Payer: [employer] Medication & CPT/J Code(s) submitted: Feraheme (ferumoxytol) [id number] Route of submission (phone, fax, portal):  Phone # Fax # Auth type: Buy/Bill PB Units/visits requested: 510MG  X2 DOSES Reference number: [id number] Approval from: 10/28/23 to 04/27/24

## 2023-10-30 ENCOUNTER — Other Ambulatory Visit: Payer: Self-pay | Admitting: Family Medicine

## 2023-10-30 DIAGNOSIS — F418 Other specified anxiety disorders: Secondary | ICD-10-CM

## 2023-10-30 NOTE — Telephone Encounter (Signed)
 Name of Medication:  Lorazepam Name of Pharmacy:  [facility] Last Fill or Written Date and Quantity:  09/19/23, #30 Last Office Visit and Type:  10/14/23, wt mgmt f/u Next Office Visit and Type:  none Last Controlled Substance Agreement Date:  10/30/17 Last UDS:  10/30/17

## 2023-10-31 MED ORDER — METHYLPHENIDATE HCL ER (OSM) 18 MG PO TBCR: 18.0000 mg | EXTENDED_RELEASE_TABLET | Freq: Every day | ORAL | 0 refills | Status: DC

## 2023-10-31 NOTE — Telephone Encounter (Signed)
 ERx

## 2023-11-11 ENCOUNTER — Ambulatory Visit (INDEPENDENT_AMBULATORY_CARE_PROVIDER_SITE_OTHER)

## 2023-11-11 VITALS — BP 140/89 | HR 87 | Temp 98.2°F | Resp 16 | Ht 65.0 in | Wt 239.6 lb

## 2023-11-11 DIAGNOSIS — Z9884 Bariatric surgery status: Secondary | ICD-10-CM | POA: Diagnosis not present

## 2023-11-11 DIAGNOSIS — D508 Other iron deficiency anemias: Secondary | ICD-10-CM | POA: Diagnosis not present

## 2023-11-11 DIAGNOSIS — K909 Intestinal malabsorption, unspecified: Secondary | ICD-10-CM | POA: Diagnosis not present

## 2023-11-11 MED ORDER — SODIUM CHLORIDE 0.9 % IV SOLN
510.0000 mg | Freq: Once | INTRAVENOUS | Status: AC
  Administered 2023-11-11: 510 mg via INTRAVENOUS
  Filled 2023-11-11: qty 17

## 2023-11-11 NOTE — Progress Notes (Signed)
 Diagnosis: Iron  Malabsorption  Provider:  Praveen Mannam MD  Procedure: IV Infusion  IV Type: Peripheral, IV Location: R Forearm  Feraheme  (Ferumoxytol ), Dose: 510 mg  Infusion Start Time: 1411  Infusion Stop Time: 1433  Post Infusion IV Care: Patient declined observation and Peripheral IV Discontinued  Discharge: Condition: Good, Destination: Home . AVS Provided  Performed by:  Deshanae Lindo, RN

## 2023-11-21 ENCOUNTER — Ambulatory Visit

## 2023-11-21 VITALS — BP 139/92 | HR 76 | Temp 97.4°F | Resp 18 | Ht 65.0 in | Wt 241.0 lb

## 2023-11-21 DIAGNOSIS — D508 Other iron deficiency anemias: Secondary | ICD-10-CM | POA: Diagnosis not present

## 2023-11-21 DIAGNOSIS — Z9884 Bariatric surgery status: Secondary | ICD-10-CM | POA: Diagnosis not present

## 2023-11-21 DIAGNOSIS — K909 Intestinal malabsorption, unspecified: Secondary | ICD-10-CM | POA: Diagnosis not present

## 2023-11-21 DIAGNOSIS — D509 Iron deficiency anemia, unspecified: Secondary | ICD-10-CM

## 2023-11-21 MED ORDER — SODIUM CHLORIDE 0.9 % IV SOLN
510.0000 mg | Freq: Once | INTRAVENOUS | Status: AC
  Administered 2023-11-21: 510 mg via INTRAVENOUS
  Filled 2023-11-21: qty 17

## 2023-11-21 NOTE — Progress Notes (Signed)
 Diagnosis: Iron  Deficiency Anemia  Provider:  Praveen Mannam MD  Procedure: IV Infusion  IV Type: Peripheral, IV Location: R Forearm  Feraheme  (Ferumoxytol ), Dose: 510 mg  Infusion Start Time: 0959  Infusion Stop Time: 1015  Post Infusion IV Care: Patient declined observation and Peripheral IV Discontinued  Discharge: Condition: Good, Destination: Home . AVS Declined  Performed by:  Kymir Coles, RN

## 2023-11-27 ENCOUNTER — Other Ambulatory Visit (HOSPITAL_COMMUNITY): Payer: Self-pay

## 2023-12-06 ENCOUNTER — Other Ambulatory Visit: Payer: Self-pay | Admitting: Family Medicine

## 2023-12-06 ENCOUNTER — Telehealth: Payer: Self-pay | Admitting: Family Medicine

## 2023-12-06 DIAGNOSIS — F902 Attention-deficit hyperactivity disorder, combined type: Secondary | ICD-10-CM

## 2023-12-06 DIAGNOSIS — F418 Other specified anxiety disorders: Secondary | ICD-10-CM

## 2023-12-06 NOTE — Telephone Encounter (Signed)
 Copied from CRM [id number]. Topic: Clinical - Medication Refill >> Dec 06, 2023  8:09 AM Caliyah H wrote: Medication: LORazepam  (ATIVAN )  methylphenidate  (CONCERTA )  Has the patient contacted their pharmacy? Yes Patient was advise to contact her PCP regarding rx refills.  (Agent: If no, request that the patient contact the pharmacy for the refill. If patient does not wish to contact the pharmacy document the reason why and proceed with request.) (Agent: If yes, when and what did the pharmacy advise?)  This is the patient's preferred pharmacy:  [phone], [place] - [address] [place] [place] [address] Phone: [phone] Fax: [phone]  Is this the correct pharmacy for this prescription? Yes If no, delete pharmacy and type the correct one.   Has the prescription been filled recently? Yes  Is the patient out of the medication? Yes  Has the patient been seen for an appointment in the last year OR does the patient have an upcoming appointment? Yes  Can we respond through MyChart? No  Agent: Please be advised that Rx refills may take up to 3 business days. We ask that you follow-up with your pharmacy.

## 2023-12-06 NOTE — Telephone Encounter (Signed)
 Copied from CRM [id number]. Topic: Clinical - Medication Refill >> Dec 06, 2023  8:09 AM Theresa Pratt wrote: Medication: LORazepam  (ATIVAN )  methylphenidate  (CONCERTA )  Has the patient contacted their pharmacy? Yes Patient was advise to contact her PCP regarding rx refills.  (Agent: If no, request that the patient contact the pharmacy for the refill. If patient does not wish to contact the pharmacy document the reason why and proceed with request.) (Agent: If yes, when and what did the pharmacy advise?)  This is the patient's preferred pharmacy:  [phone] [place] - [address] [place] [place] [address] Phone: [phone] Fax: [phone]  Is this the correct pharmacy for this prescription? Yes If no, delete pharmacy and type the correct one.   Has the prescription been filled recently? Yes  Is the patient out of the medication? Yes  Has the patient been seen for an appointment in the last year OR does the patient have an upcoming appointment? Yes  Can we respond through MyChart? No  Agent: Please be advised that Rx refills may take up to 3 business days. We ask that you follow-up with your pharmacy.

## 2023-12-06 NOTE — Telephone Encounter (Signed)
 Duplicate, see refill encounter dated today.

## 2023-12-06 NOTE — Telephone Encounter (Signed)
 Lorazepam  last filled 10-31-23 #30 Methylphenidate  last filled 10-31-23 #30 Last OV 10-14-23 No future OV [facility]

## 2023-12-10 MED ORDER — LORAZEPAM 0.5 MG PO TABS: ORAL_TABLET | ORAL | 0 refills | Status: DC

## 2023-12-10 MED ORDER — METHYLPHENIDATE HCL ER (OSM) 18 MG PO TBCR: 18.0000 mg | EXTENDED_RELEASE_TABLET | Freq: Every day | ORAL | 0 refills | Status: DC

## 2023-12-10 NOTE — Telephone Encounter (Signed)
 ERx

## 2023-12-18 ENCOUNTER — Other Ambulatory Visit (HOSPITAL_COMMUNITY): Payer: Self-pay

## 2024-01-09 ENCOUNTER — Other Ambulatory Visit: Payer: Self-pay | Admitting: Family Medicine

## 2024-01-09 DIAGNOSIS — F902 Attention-deficit hyperactivity disorder, combined type: Secondary | ICD-10-CM

## 2024-01-09 NOTE — Telephone Encounter (Signed)
 Copied from CRM [id number]. Topic: Clinical - Medication Refill >> Jan 09, 2024  2:35 PM Berneda FALCON wrote: Medication:  methylphenidate  (CONCERTA ) 18 MG PO CR tablet   Has the patient contacted their pharmacy? Yes (Agent: If no, request that the patient contact the pharmacy for the refill. If patient does not wish to contact the pharmacy document the reason why and proceed with request.) (Agent: If yes, when and what did the pharmacy advise?)  This is the patient's preferred pharmacy:  [facility] [phone] [facility] [place] - [address] [place] [address] Phone: [phone] Fax: [phone]  Is this the correct pharmacy for this prescription? Yes If no, delete pharmacy and type the correct one.   Has the prescription been filled recently? No  Is the patient out of the medication? Yes, as of tomorrow  Has the patient been seen for an appointment in the last year OR does the patient have an upcoming appointment? Yes  Can we respond through MyChart? No, please call [phone]  Agent: Please be advised that Rx refills may take up to 3 business days. We ask that you follow-up with your pharmacy.

## 2024-01-10 ENCOUNTER — Other Ambulatory Visit (HOSPITAL_COMMUNITY): Payer: Self-pay

## 2024-01-10 NOTE — Telephone Encounter (Signed)
 Name of Medication:  Concerta  Name of Pharmacy:  [facility] Last Fill or Written Date and Quantity:  12/10/23, #30 Last Office Visit and Type:  10/14/23, wt mgmt f/u Next Office Visit and Type:  none Last Controlled Substance Agreement Date:  10/30/17 Last UDS:  10/30/17

## 2024-01-13 MED ORDER — METHYLPHENIDATE HCL ER (OSM) 18 MG PO TBCR: 18.0000 mg | EXTENDED_RELEASE_TABLET | Freq: Every day | ORAL | 0 refills | Status: DC

## 2024-01-13 NOTE — Telephone Encounter (Signed)
 ERx

## 2024-01-31 ENCOUNTER — Other Ambulatory Visit (HOSPITAL_COMMUNITY): Payer: Self-pay

## 2024-01-31 ENCOUNTER — Telehealth: Payer: Self-pay

## 2024-01-31 NOTE — Telephone Encounter (Signed)
 Pharmacy Patient Advocate Encounter   Received notification from CoverMyMeds that prior authorization for Wegovy  0.5 is required/requested.   Insurance verification completed.   The patient is insured through [facility] .   Per test claim: PA required; PA submitted to above mentioned insurance via CoverMyMeds Key/confirmation #/EOC [id number] Status is pending

## 2024-02-03 ENCOUNTER — Other Ambulatory Visit (HOSPITAL_COMMUNITY): Payer: Self-pay

## 2024-02-03 NOTE — Telephone Encounter (Signed)
 Spoke with pt relaying Dr Talmadge message. Pt verbalizes understanding.   Pt scheduled CPE on 04/01/24 at 12:30 and fasting lab visit on 03/23/24 at 7:30.

## 2024-02-03 NOTE — Telephone Encounter (Addendum)
 Plz notify pt insurance is not covering Wegovy  and it would cost her >1k per month. Will need to discuss other options if interested in bariatric medication.   Also rec schedule CPE as overdue (last done 06/2022)

## 2024-02-03 NOTE — Telephone Encounter (Signed)
 Per CMM patient primary insurance not covered. Pharmacy Patient Advocate Encounter  Received notification from [facility] that Prior Authorization for Wegovy  0.5 has been CANCELLED due to not in formulary. Re-ran test claim under patients secondary  insurance. Patient co-pay is $1,285.44.  PA #/Case ID/Reference #: [id number]

## 2024-02-17 ENCOUNTER — Other Ambulatory Visit: Payer: Self-pay | Admitting: Family Medicine

## 2024-02-17 DIAGNOSIS — F902 Attention-deficit hyperactivity disorder, combined type: Secondary | ICD-10-CM

## 2024-02-17 DIAGNOSIS — F418 Other specified anxiety disorders: Secondary | ICD-10-CM

## 2024-02-17 NOTE — Telephone Encounter (Signed)
 Copied from CRM [id number]. Topic: Clinical - Medication Refill >> Feb 17, 2024  1:28 PM Shereese L wrote: Medication:LORazepam  (ATIVAN ) 0.5 MG tablet methylphenidate  (CONCERTA ) 18 MG PO CR tablet    Has the patient contacted their pharmacy? Yes (Agent: If no, request that the patient contact the pharmacy for the refill. If patient does not wish to contact the pharmacy document the reason why and proceed with request.) (Agent: If yes, when and what did the pharmacy advise?)  This is the patient's preferred pharmacy:  [facility] [phone], [place] - [address] [place] [address] Phone: [phone] Fax: [phone]  Is this the correct pharmacy for this prescription? Yes If no, delete pharmacy and type the correct one.   Has the prescription been filled recently? Yes  Is the patient out of the medication? Yes  Has the patient been seen for an appointment in the last year OR does the patient have an upcoming appointment? Yes  Can we respond through MyChart? Yes  Agent: Please be advised that Rx refills may take up to 3 business days. We ask that you follow-up with your pharmacy.

## 2024-02-21 NOTE — Telephone Encounter (Signed)
 Name of Medication:  Concerta  Name of Pharmacy:  [facility] Last Fill or Written Date and Quantity: 01/13/24, #30 Last Office Visit and Type:  10/14/23, wt mgmt f/u Next Office Visit and Type: 04/01/24 Last Controlled Substance Agreement Date:  10/30/17 Last UDS:  10/30/17  Name of Medication:  Lorazepam   Name of Pharmacy:  [facility] Last Fill or Written Date and Quantity: 12/10/23, #30 Last Office Visit and Type:  10/14/23, wt mgmt f/u Next Office Visit and Type: 04/01/24 Last Controlled Substance Agreement Date:  10/30/17 Last UDS:  10/30/17

## 2024-02-21 NOTE — Telephone Encounter (Signed)
 Copied from CRM [id number]. Topic: Clinical - Medication Refill >> Feb 17, 2024  1:28 PM Shereese L wrote: Medication:LORazepam  (ATIVAN ) 0.5 MG tablet methylphenidate  (CONCERTA ) 18 MG PO CR tablet    Has the patient contacted their pharmacy? Yes (Agent: If no, request that the patient contact the pharmacy for the refill. If patient does not wish to contact the pharmacy document the reason why and proceed with request.) (Agent: If yes, when and what did the pharmacy advise?)  This is the patient's preferred pharmacy:  [facility] [phone] [facility] [place] - [address] [place] [address] Phone: [phone] Fax: [phone]  Is this the correct pharmacy for this prescription? Yes If no, delete pharmacy and type the correct one.   Has the prescription been filled recently? Yes  Is the patient out of the medication? Yes  Has the patient been seen for an appointment in the last year OR does the patient have an upcoming appointment? Yes  Can we respond through MyChart? Yes  Agent: Please be advised that Rx refills may take up to 3 business days. We ask that you follow-up with your pharmacy. >> Feb 21, 2024  8:35 AM Mia F wrote: Pt is calling back to check the status of her medications that was called in on 8/4. She is out. Please advise

## 2024-02-25 MED ORDER — METHYLPHENIDATE HCL ER (OSM) 18 MG PO TBCR: 18.0000 mg | EXTENDED_RELEASE_TABLET | Freq: Every day | ORAL | 0 refills | Status: DC

## 2024-02-25 MED ORDER — LORAZEPAM 0.5 MG PO TABS: ORAL_TABLET | ORAL | 0 refills | Status: DC

## 2024-02-25 NOTE — Telephone Encounter (Signed)
 ERx

## 2024-02-26 ENCOUNTER — Telehealth: Payer: Self-pay

## 2024-02-26 ENCOUNTER — Other Ambulatory Visit (HOSPITAL_COMMUNITY): Payer: Self-pay

## 2024-02-26 NOTE — Telephone Encounter (Signed)
 Pharmacy Patient Advocate Encounter   Received notification from CoverMyMeds that prior authorization for Wegovy  is required/requested.   Insurance verification completed.   The patient is insured through [facility] .   Per test claim: see encounter 02/03/24. Will submit for PA after criteria has been met

## 2024-03-17 ENCOUNTER — Other Ambulatory Visit (HOSPITAL_COMMUNITY): Payer: Self-pay

## 2024-03-19 ENCOUNTER — Other Ambulatory Visit (HOSPITAL_COMMUNITY): Payer: Self-pay

## 2024-03-21 ENCOUNTER — Other Ambulatory Visit: Payer: Self-pay | Admitting: Family Medicine

## 2024-03-21 DIAGNOSIS — E538 Deficiency of other specified B group vitamins: Secondary | ICD-10-CM

## 2024-03-21 DIAGNOSIS — E559 Vitamin D deficiency, unspecified: Secondary | ICD-10-CM

## 2024-03-21 DIAGNOSIS — D509 Iron deficiency anemia, unspecified: Secondary | ICD-10-CM

## 2024-03-21 DIAGNOSIS — I10 Essential (primary) hypertension: Secondary | ICD-10-CM

## 2024-03-21 DIAGNOSIS — E519 Thiamine deficiency, unspecified: Secondary | ICD-10-CM

## 2024-03-21 DIAGNOSIS — E042 Nontoxic multinodular goiter: Secondary | ICD-10-CM

## 2024-03-21 DIAGNOSIS — Z9884 Bariatric surgery status: Secondary | ICD-10-CM

## 2024-03-23 ENCOUNTER — Other Ambulatory Visit

## 2024-04-01 ENCOUNTER — Encounter: Admitting: Family Medicine

## 2024-04-03 ENCOUNTER — Other Ambulatory Visit: Payer: Self-pay | Admitting: Family Medicine

## 2024-04-03 DIAGNOSIS — F418 Other specified anxiety disorders: Secondary | ICD-10-CM

## 2024-04-03 DIAGNOSIS — F902 Attention-deficit hyperactivity disorder, combined type: Secondary | ICD-10-CM

## 2024-04-03 NOTE — Telephone Encounter (Signed)
 Copied from CRM [id number]. Topic: Clinical - Medication Refill >> Apr 03, 2024 12:37 PM Dedra B wrote: Medication: methylphenidate  (CONCERTA ) 18 MG PO CR tablet LORazepam  (ATIVAN ) 0.5 MG tablet  Has the patient contacted their pharmacy? No, out of refills  This is the patient's preferred pharmacy:  [phone] [facility] [place] - [address] [place] [address] Phone: [phone] Fax: [phone]  Is this the correct pharmacy for this prescription? Yes  Has the prescription been filled recently? No  Is the patient out of the medication? Yes  Has the patient been seen for an appointment in the last year OR does the patient have an upcoming appointment? Yes  Can we respond through MyChart? No  Agent: Please be advised that Rx refills may take up to 3 business days. We ask that you follow-up with your pharmacy.

## 2024-04-09 MED ORDER — LORAZEPAM 0.5 MG PO TABS: ORAL_TABLET | ORAL | 0 refills | Status: DC

## 2024-04-09 MED ORDER — METHYLPHENIDATE HCL ER (OSM) 18 MG PO TBCR: 18.0000 mg | EXTENDED_RELEASE_TABLET | Freq: Every day | ORAL | 0 refills | Status: DC

## 2024-04-09 NOTE — Telephone Encounter (Signed)
 ERx

## 2024-05-01 ENCOUNTER — Other Ambulatory Visit (HOSPITAL_COMMUNITY): Payer: Self-pay

## 2024-05-01 ENCOUNTER — Telehealth: Payer: Self-pay

## 2024-05-01 DIAGNOSIS — F418 Other specified anxiety disorders: Secondary | ICD-10-CM

## 2024-05-01 DIAGNOSIS — F902 Attention-deficit hyperactivity disorder, combined type: Secondary | ICD-10-CM

## 2024-05-01 NOTE — Telephone Encounter (Signed)
 Pharmacy Patient Advocate Encounter   Received notification from Onbase that prior authorization for Wegovy  0.25 is required/requested.   Insurance verification completed.   The patient is insured through [facility].   Per test claim: PA required; PA submitted to above mentioned insurance via Latent Key/confirmation #/EOC [id number] Status is pending

## 2024-05-04 ENCOUNTER — Other Ambulatory Visit (HOSPITAL_COMMUNITY): Payer: Self-pay

## 2024-05-22 NOTE — Telephone Encounter (Signed)
 Copied from CRM [id number]. Topic: Clinical - Medication Refill >> May 22, 2024 12:40 PM Viola F wrote: Medication: LORazepam  (ATIVAN ) 0.5 MG tablet [[id number]] methylphenidate  (CONCERTA ) 18 MG PO CR tablet [[id number]]  Has the patient contacted their pharmacy? Yes (Agent: If no, request that the patient contact the pharmacy for the refill. If patient does not wish to contact the pharmacy document the reason why and proceed with request.) (Agent: If yes, when and what did the pharmacy advise?)  This is the patient's preferred pharmacy:  [phone] [place] - [address] [place] [address] Phone: [phone] Fax: [phone]  Is this the correct pharmacy for this prescription? Yes If no, delete pharmacy and type the correct one.   Has the prescription been filled recently? Yes  Is the patient out of the medication? Yes, today last day   Has the patient been seen for an appointment in the last year OR does the patient have an upcoming appointment? Yes  Can we respond through MyChart? No  Agent: Please be advised that Rx refills may take up to 3 business days. We ask that you follow-up with your pharmacy.

## 2024-05-25 MED ORDER — LORAZEPAM 0.5 MG PO TABS: ORAL_TABLET | ORAL | 0 refills | Status: DC

## 2024-05-25 MED ORDER — METHYLPHENIDATE HCL ER (OSM) 18 MG PO TBCR: 18.0000 mg | EXTENDED_RELEASE_TABLET | Freq: Every day | ORAL | 0 refills | Status: AC

## 2024-05-25 NOTE — Telephone Encounter (Signed)
 Requesting: Lorazepam  and Concerta  Contract: No UDS: NA Last Visit: 10/14/2023 Next Visit: 05/26/2024 Last Refill: 04/09/24

## 2024-05-25 NOTE — Telephone Encounter (Signed)
 ERx

## 2024-05-26 ENCOUNTER — Other Ambulatory Visit (HOSPITAL_COMMUNITY): Payer: Self-pay

## 2024-05-26 ENCOUNTER — Telehealth: Payer: Self-pay

## 2024-05-26 ENCOUNTER — Ambulatory Visit: Admitting: Family Medicine

## 2024-05-26 ENCOUNTER — Encounter: Payer: Self-pay | Admitting: Family Medicine

## 2024-05-26 VITALS — BP 148/98 | HR 81 | Temp 97.8°F | Ht 65.0 in | Wt 240.2 lb

## 2024-05-26 DIAGNOSIS — E538 Deficiency of other specified B group vitamins: Secondary | ICD-10-CM

## 2024-05-26 DIAGNOSIS — E519 Thiamine deficiency, unspecified: Secondary | ICD-10-CM

## 2024-05-26 DIAGNOSIS — E559 Vitamin D deficiency, unspecified: Secondary | ICD-10-CM | POA: Diagnosis not present

## 2024-05-26 DIAGNOSIS — F902 Attention-deficit hyperactivity disorder, combined type: Secondary | ICD-10-CM

## 2024-05-26 DIAGNOSIS — Z0000 Encounter for general adult medical examination without abnormal findings: Secondary | ICD-10-CM | POA: Diagnosis not present

## 2024-05-26 DIAGNOSIS — Z9884 Bariatric surgery status: Secondary | ICD-10-CM | POA: Diagnosis not present

## 2024-05-26 DIAGNOSIS — Z23 Encounter for immunization: Secondary | ICD-10-CM

## 2024-05-26 DIAGNOSIS — E042 Nontoxic multinodular goiter: Secondary | ICD-10-CM

## 2024-05-26 DIAGNOSIS — L501 Idiopathic urticaria: Secondary | ICD-10-CM

## 2024-05-26 DIAGNOSIS — I10 Essential (primary) hypertension: Secondary | ICD-10-CM

## 2024-05-26 DIAGNOSIS — R0683 Snoring: Secondary | ICD-10-CM

## 2024-05-26 DIAGNOSIS — D509 Iron deficiency anemia, unspecified: Secondary | ICD-10-CM

## 2024-05-26 LAB — VITAMIN D 25 HYDROXY (VIT D DEFICIENCY, FRACTURES): VITD: 54.03 ng/mL (ref 30.00–100.00)

## 2024-05-26 LAB — CBC WITH DIFFERENTIAL/PLATELET
Basophils Absolute: 0 K/uL (ref 0.0–0.1)
Basophils Relative: 0.1 % (ref 0.0–3.0)
Eosinophils Absolute: 0.2 K/uL (ref 0.0–0.7)
Eosinophils Relative: 2.7 % (ref 0.0–5.0)
HCT: 38.6 % (ref 36.0–46.0)
Hemoglobin: 12.5 g/dL (ref 12.0–15.0)
Lymphocytes Relative: 15.8 % (ref 12.0–46.0)
Lymphs Abs: 1.1 K/uL (ref 0.7–4.0)
MCHC: 32.3 g/dL (ref 30.0–36.0)
MCV: 89 fl (ref 78.0–100.0)
Monocytes Absolute: 0.5 K/uL (ref 0.1–1.0)
Monocytes Relative: 6.5 % (ref 3.0–12.0)
Neutro Abs: 5.2 K/uL (ref 1.4–7.7)
Neutrophils Relative %: 74.9 % (ref 43.0–77.0)
Platelets: 182 K/uL (ref 150.0–400.0)
RBC: 4.34 Mil/uL (ref 3.87–5.11)
RDW: 15.4 % (ref 11.5–15.5)
WBC: 7 K/uL (ref 4.0–10.5)

## 2024-05-26 LAB — LIPID PANEL
Cholesterol: 190 mg/dL (ref 0–200)
HDL: 53.2 mg/dL (ref >39.00)
LDL Cholesterol: 108 mg/dL — ABNORMAL HIGH (ref 0–99)
NonHDL: 136.75
Total CHOL/HDL Ratio: 4
Triglycerides: 145 mg/dL (ref 0.0–149.0)
VLDL: 29 mg/dL (ref 0.0–40.0)

## 2024-05-26 LAB — COMPREHENSIVE METABOLIC PANEL WITH GFR
ALT: 15 U/L (ref 0–35)
AST: 17 U/L (ref 0–37)
Albumin: 4.3 g/dL (ref 3.5–5.2)
Alkaline Phosphatase: 114 U/L (ref 39–117)
BUN: 11 mg/dL (ref 6–23)
CO2: 29 meq/L (ref 19–32)
Calcium: 8.8 mg/dL (ref 8.4–10.5)
Chloride: 102 meq/L (ref 96–112)
Creatinine, Ser: 0.77 mg/dL (ref 0.40–1.20)
GFR: 86.83 mL/min (ref >60.00)
Glucose, Bld: 87 mg/dL (ref 70–99)
Potassium: 4.1 meq/L (ref 3.5–5.1)
Sodium: 138 meq/L (ref 135–145)
Total Bilirubin: 1.1 mg/dL (ref 0.2–1.2)
Total Protein: 7 g/dL (ref 6.0–8.3)

## 2024-05-26 LAB — IBC PANEL
Iron: 93 ug/dL (ref 42–145)
Saturation Ratios: 28 % (ref 20.0–50.0)
TIBC: 331.8 ug/dL (ref 250.0–450.0)
Transferrin: 237 mg/dL (ref 212.0–360.0)

## 2024-05-26 LAB — FERRITIN: Ferritin: 193.8 ng/mL (ref 10.0–291.0)

## 2024-05-26 LAB — MICROALBUMIN / CREATININE URINE RATIO
Creatinine,U: 204.3 mg/dL
Microalb Creat Ratio: 18.2 mg/g (ref 0.0–30.0)
Microalb, Ur: 3.7 mg/dL — ABNORMAL HIGH (ref 0.0–1.9)

## 2024-05-26 LAB — VITAMIN B12: Vitamin B-12: 341 pg/mL (ref 211–911)

## 2024-05-26 LAB — TSH: TSH: 1.19 u[IU]/mL (ref 0.35–5.50)

## 2024-05-26 LAB — FOLATE: Folate: 18 ng/mL (ref >5.9)

## 2024-05-26 MED ORDER — VITAMIN D3 1.25 MG (50000 UT) PO CAPS: 1.0000 | ORAL_CAPSULE | ORAL | 3 refills | Status: AC

## 2024-05-26 MED ORDER — MONTELUKAST SODIUM 10 MG PO TABS: 10.0000 mg | ORAL_TABLET | Freq: Every day | ORAL | 11 refills | Status: AC

## 2024-05-26 MED ORDER — CYANOCOBALAMIN 1000 MCG/ML IJ SOLN: 1000.0000 ug | INTRAMUSCULAR | 3 refills | Status: AC

## 2024-05-26 MED ORDER — METOPROLOL SUCCINATE ER 25 MG PO TB24
25.0000 mg | ORAL_TABLET | Freq: Every day | ORAL | 3 refills | Status: AC
Start: 2024-05-26 — End: ?

## 2024-05-26 MED ORDER — HYDROXYZINE HCL 10 MG PO TABS: 10.0000 mg | ORAL_TABLET | Freq: Every day | ORAL | 1 refills | Status: AC | PRN

## 2024-05-26 MED ORDER — AMLODIPINE BESYLATE 5 MG PO TABS
5.0000 mg | ORAL_TABLET | Freq: Every day | ORAL | 3 refills | Status: AC
Start: 2024-05-26 — End: ?

## 2024-05-26 NOTE — Assessment & Plan Note (Addendum)
 Update levels on weekly replacement, refilled today.

## 2024-05-26 NOTE — Assessment & Plan Note (Signed)
 Update labs on monthly B12 shots, hasn't received recent injection.  She does these at home - daughter adminsiters.

## 2024-05-26 NOTE — Assessment & Plan Note (Signed)
 Continue concerta 18mg  daily PRN.

## 2024-05-26 NOTE — Assessment & Plan Note (Signed)
 Update TSH

## 2024-05-26 NOTE — Patient Instructions (Addendum)
 Flu shot today and prevnar-20 today (pneumonia shot) Labs today  BP staying elevated - restart amlodipine  5mg  in the mornings, continue Toprol  XL 25mg  at night.  Call and schedule mammogram as you're due.  Good to see you today Return as needed or in 3-4 months for blood pressure recheck

## 2024-05-26 NOTE — Progress Notes (Signed)
 Ph: [phone] Fax: [phone]   Patient ID: Theresa Pratt, female    DOB: 12-04-1970, 53 y.o.   MRN: [id number]  This visit was conducted in person.  BP (!) 148/98 (BP Location: Right Arm, Cuff Size: Large)   Pulse 81   Temp 97.8 F (36.6 C) (Oral)   Ht 5' 5 (1.651 m)   Wt 240 lb 4 oz (109 kg)   SpO2 97%   BMI 39.98 kg/m   BP Readings from Last 3 Encounters:  05/26/24 (!) 148/98  11/21/23 (!) 139/92  11/11/23 (!) 140/89   CC: CPE Subjective:   HPI: Theresa Pratt is a 53 y.o. female presenting on 05/26/2024 for Annual Exam   S/p gastric bypass (Roux-en-y 2011).   Vitamin B12 deficiency - continues monthly injections at home, none recently.   S/p iron  infusions x2 (feraheme  510mg ) 10/2023, 11/2023.   Chronic idiopathic urticaria - first Xolair injection 04/18/2022, also takes allegra  180mg  twice daily, and pepcid  20mg  once daily. Now on xolair Q2 wks, planning to add another medication as well (?cyclosporine ). Occasional zyrtec  at night PRN. Last visit we refilled singulair  10mg  nightly. Sees allergist Dr Sebastian in [facility].   Home BPs 120-130s/80-90s. Had not been taking amlodipine .    Preventative: COLONOSCOPY WITH PROPOFOL  08/24/2022 - TAx1, rpt 7 yrs (Vanga, Corinn Skiff, MD) [facility] last CNM Lucie 06/2022 with normal pap LMP - 04/2023 Mammogram 02/2022 Birads1 @ [facility] - due for f/u, # provided to call and schedule appt Lung cancer screening - not eligible  Flu shot yearly  Prevnar-20 today Td - 02/2009. Tdap 05/2020  COVID vaccine Moderna 09/2019, 10/2019, booster 05/2020 Shingrix - 05/2020, 08/2020 Seat belt use discussed  Sunscreen use discussed. No changing moles on skin.  Non smoker  Alcohol - seldom  Dentist - q6 mo - due Eye exam yearly - due  Lives with parents, husband, 2 children and small dog   Occupation: new job at [employer] - 5th year Activity: some walking at  work  Diet: good water, fruits/vegetables daily      Relevant past medical, surgical, family and social history reviewed and updated as indicated. Interim medical history since our last visit reviewed. Allergies and medications reviewed and updated. Outpatient Medications Prior to Visit  Medication Sig Dispense Refill   ascorbic acid  (VITAMIN C) 500 MG tablet Take 1 tablet (500 mg total) by mouth daily.     Calcium  Magnesium  Zinc  333-133-5 MG TABS Take 1 tablet by mouth daily.     EPINEPHrine  0.3 mg/0.3 mL IJ SOAJ injection      famotidine  (PEPCID ) 20 MG tablet Take 1 tablet (20 mg total) by mouth 2 (two) times daily. 180 tablet 1   ferrous sulfate (SLOW RELEASE IRON ) 160 (50 Fe) MG TBCR SR tablet Take 1 tablet (160 mg total) by mouth daily.     fexofenadine  (ALLEGRA  ALLERGY ) 180 MG tablet Take 2 tablets (360 mg total) by mouth in the morning and at bedtime.     LORazepam  (ATIVAN ) 0.5 MG tablet TAKE 1 TABLET BY MOUTH TWICE A DAY AS NEEDED FOR ANXIETY 30 tablet 0   methylphenidate  (CONCERTA ) 18 MG PO CR tablet Take 1 tablet (18 mg total) by mouth daily. 30 tablet 0   Multiple Vitamin (MULTIVITAMIN) tablet Take 1 tablet by mouth daily.     Syringe/Needle, Disp, (SYRINGE 3CC/25GX1) 25G X 1 3 ML MISC Use with B12 injection once monthly 1  each 6   thiamine  100 MG tablet Take 1 tablet (100 mg total) by mouth daily.     triamcinolone  cream (KENALOG ) 0.1 % Apply 1 Application topically 2 (two) times daily. 30 g 0   Cholecalciferol  (VITAMIN D3) 1.25 MG (50000 UT) CAPS TAKE 1 CAPSULE BY MOUTH ONCE A WEEK 12 capsule 3   cyanocobalamin  (,VITAMIN B-12,) 1000 MCG/ML injection Inject 1 mL (1,000 mcg total) into the muscle every 30 (thirty) days. 3 mL 2   hydrOXYzine  (ATARAX ) 10 MG tablet Take 1 tablet (10 mg total) by mouth daily as needed for itching (hives). 90 tablet 2   Iron -Vitamins (GERITOL PO) Take 1 tablet by mouth daily.     metoprolol  succinate (TOPROL -XL) 25 MG 24 hr tablet TAKE 1 TABLET BY  MOUTH DAILY. TAKE WITH OR IMMEDIATELY FOLLOWING A MEAL. 90 tablet 0   montelukast  (SINGULAIR ) 10 MG tablet Take 1 tablet (10 mg total) by mouth at bedtime. 30 tablet 11   cycloSPORINE  modified (NEORAL ) 50 MG capsule Take 50 mg by mouth 2 (two) times daily. (Patient not taking: Reported on 05/26/2024)     XOLAIR 150 MG/ML prefilled syringe Inject 300 mg into the skin every 14 (fourteen) days. (Patient not taking: Reported on 05/26/2024)     amLODipine  (NORVASC ) 5 MG tablet Take 1 tablet (5 mg total) by mouth daily. (Patient not taking: Reported on 05/26/2024) 30 tablet 6   No facility-administered medications prior to visit.     Per HPI unless specifically indicated in ROS section below Review of Systems  Constitutional:  Negative for activity change, appetite change, chills, fatigue, fever and unexpected weight change.  HENT:  Negative for hearing loss.   Eyes:  Negative for visual disturbance.  Respiratory:  Positive for shortness of breath (exertional). Negative for cough, chest tightness and wheezing.   Cardiovascular:  Negative for chest pain, palpitations and leg swelling.  Gastrointestinal:  Positive for constipation (iron  related). Negative for abdominal distention, abdominal pain, blood in stool, diarrhea, nausea and vomiting.  Genitourinary:  Negative for difficulty urinating and hematuria.  Musculoskeletal:  Negative for arthralgias, myalgias and neck pain.  Skin:  Negative for rash.  Neurological:  Negative for dizziness, seizures, syncope and headaches.  Hematological:  Negative for adenopathy. Does not bruise/bleed easily.  Psychiatric/Behavioral:  Negative for dysphoric mood. The patient is not nervous/anxious.     Objective:  BP (!) 148/98 (BP Location: Right Arm, Cuff Size: Large)   Pulse 81   Temp 97.8 F (36.6 C) (Oral)   Ht 5' 5 (1.651 m)   Wt 240 lb 4 oz (109 kg)   SpO2 97%   BMI 39.98 kg/m   Wt Readings from Last 3 Encounters:  05/26/24 240 lb 4 oz (109 kg)   11/21/23 241 lb (109.3 kg)  11/11/23 239 lb 9.6 oz (108.7 kg)      Physical Exam Vitals and nursing note reviewed.  Constitutional:      Appearance: Normal appearance. She is not ill-appearing.  HENT:     Head: Normocephalic and atraumatic.     Right Ear: Tympanic membrane, ear canal and external ear normal. There is no impacted cerumen.     Left Ear: Tympanic membrane, ear canal and external ear normal. There is no impacted cerumen.     Mouth/Throat:     Mouth: Mucous membranes are moist.     Pharynx: Oropharynx is clear. No oropharyngeal exudate or posterior oropharyngeal erythema.  Eyes:     General:  Right eye: No discharge.        Left eye: No discharge.     Extraocular Movements: Extraocular movements intact.     Conjunctiva/sclera: Conjunctivae normal.     Pupils: Pupils are equal, round, and reactive to light.  Neck:     Thyroid : No thyroid  mass or thyromegaly.  Cardiovascular:     Rate and Rhythm: Normal rate and regular rhythm.     Pulses: Normal pulses.     Heart sounds: Normal heart sounds. No murmur heard. Pulmonary:     Effort: Pulmonary effort is normal. No respiratory distress.     Breath sounds: Normal breath sounds. No wheezing, rhonchi or rales.  Abdominal:     General: Bowel sounds are normal. There is no distension.     Palpations: Abdomen is soft. There is no mass.     Tenderness: There is no abdominal tenderness. There is no guarding or rebound.     Hernia: No hernia is present.  Musculoskeletal:     Cervical back: Normal range of motion and neck supple. No rigidity.     Right lower leg: No edema.     Left lower leg: No edema.  Lymphadenopathy:     Cervical: No cervical adenopathy.  Skin:    General: Skin is warm and dry.     Findings: No rash.  Neurological:     General: No focal deficit present.     Mental Status: She is alert. Mental status is at baseline.  Psychiatric:        Mood and Affect: Mood normal.        Behavior: Behavior  normal.       Results for orders placed or performed in visit on 10/14/23  Hemoglobin A1c   Collection Time: 10/14/23 12:00 PM  Result Value Ref Range   Hgb A1c MFr Bld 4.3 (L) 4.8 - 5.6 %   Est. average glucose Bld gHb Est-mCnc 77 mg/dL    Assessment & Plan:   Problem List Items Addressed This Visit     Status post bariatric surgery (Chronic)   Update bariatric labs s/p roux en y gastric bypass 2003 at [facility] maintenance examination - Primary (Chronic)   Preventative protocols reviewed and updated unless pt declined. Discussed healthy diet and lifestyle.       Essential hypertension   Chronic, BP above goal.  She didn't take toprol  XL last night, she has not been taking amlodipine  5mg . Will restart amlodipine  5mg  in am and continue toprol  XL 25mg  at night time.  RTC 3 mo HTN f/u visit.       Relevant Medications   amLODipine  (NORVASC ) 5 MG tablet   metoprolol  succinate (TOPROL -XL) 25 MG 24 hr tablet   Vitamin D  deficiency   Update levels on weekly replacement, refilled today.       Nontoxic multinodular goiter   Update TSH      Relevant Medications   metoprolol  succinate (TOPROL -XL) 25 MG 24 hr tablet   Chronic idiopathic urticaria   Appreciate allergist care. She has stopped cyclosporine , continues high dose allegra  2 tab bid with hydroxyzine  PRN.  Refilled singulair .       Attention deficit hyperactivity disorder (ADHD), combined type   Continue concerta  18mg  daily PRN      Vitamin B12 deficiency   Update labs on monthly B12 shots, hasn't received recent injection.  She does these at home - daughter adminsiters.       Severe  obesity (BMI 35.0-39.9) with comorbidity (HCC)   Encourage healthy diet and lifestyle changes to effect sustainable weight loss.        Thiamine  deficiency   Update labs on daily replacement.       Snoring   HST never completed.       Iron  deficiency anemia   Update labs on slow release ferrous sulfate 160mg   daily.  Received iron  infusion Feraheme  x2 11/2023       Relevant Medications   cyanocobalamin  (VITAMIN B12) 1000 MCG/ML injection   Other Visit Diagnoses       Need for vaccination against Streptococcus pneumoniae       Relevant Orders   Pneumococcal conjugate vaccine 20-valent (Prevnar 20) (Completed)     Encounter for immunization       Relevant Orders   Flu vaccine trivalent PF, 6mos and older(Flulaval,Afluria,Fluarix,Fluzone) (Completed)        Meds ordered this encounter  Medications   amLODipine  (NORVASC ) 5 MG tablet    Sig: Take 1 tablet (5 mg total) by mouth daily.    Dispense:  90 tablet    Refill:  3   Cholecalciferol  (VITAMIN D3) 1.25 MG (50000 UT) CAPS    Sig: Take 1 capsule (1.25 mg total) by mouth once a week.    Dispense:  12 capsule    Refill:  3   cyanocobalamin  (VITAMIN B12) 1000 MCG/ML injection    Sig: Inject 1 mL (1,000 mcg total) into the muscle every 30 (thirty) days.    Dispense:  3 mL    Refill:  3   hydrOXYzine  (ATARAX ) 10 MG tablet    Sig: Take 1 tablet (10 mg total) by mouth daily as needed for itching (hives).    Dispense:  90 tablet    Refill:  1   metoprolol  succinate (TOPROL -XL) 25 MG 24 hr tablet    Sig: Take 1 tablet (25 mg total) by mouth daily. TAKE WITH OR IMMEDIATELY FOLLOWING A MEAL.    Dispense:  90 tablet    Refill:  3   montelukast  (SINGULAIR ) 10 MG tablet    Sig: Take 1 tablet (10 mg total) by mouth at bedtime.    Dispense:  30 tablet    Refill:  11    Orders Placed This Encounter  Procedures   Pneumococcal conjugate vaccine 20-valent (Prevnar 20)   Flu vaccine trivalent PF, 6mos and older(Flulaval,Afluria,Fluarix,Fluzone)    Patient Instructions  Flu shot today and prevnar-20 today (pneumonia shot) Labs today  BP staying elevated - restart amlodipine  5mg  in the mornings, continue Toprol  XL 25mg  at night.  Call and schedule mammogram as you're due.  Good to see you today Return as needed or in 3-4 months for blood  pressure recheck   Follow up plan: Return in about 4 months (around 09/23/2024) for follow up visit.  Anton Blas, MD

## 2024-05-26 NOTE — Assessment & Plan Note (Addendum)
 Update labs on slow release ferrous sulfate 160mg  daily.  Received iron  infusion Feraheme  x2 11/2023

## 2024-05-26 NOTE — Telephone Encounter (Signed)
 Pharmacy Patient Advocate Encounter   Received notification from Onbase that prior authorization for Wegovy  0.25 is required/requested.   Insurance verification completed.   The patient is insured through [facility].   Per test claim: PA required; PA submitted to above mentioned insurance via Prompt PA Key/confirmation #/EOC [id number] Status is pending

## 2024-05-26 NOTE — Assessment & Plan Note (Signed)
 Update bariatric labs s/p roux en y gastric bypass 2003 at [facility]

## 2024-05-26 NOTE — Assessment & Plan Note (Signed)
 Appreciate allergist care. She has stopped cyclosporine , continues high dose allegra  2 tab bid with hydroxyzine  PRN.  Refilled singulair .

## 2024-05-26 NOTE — Assessment & Plan Note (Addendum)
 HST not completed.

## 2024-05-26 NOTE — Assessment & Plan Note (Signed)
 Update labs on daily replacement.

## 2024-05-26 NOTE — Assessment & Plan Note (Signed)
 Preventative protocols reviewed and updated unless pt declined. Discussed healthy diet and lifestyle.

## 2024-05-26 NOTE — Assessment & Plan Note (Signed)
 Encourage healthy diet and lifestyle changes to effect sustainable weight loss.

## 2024-05-26 NOTE — Assessment & Plan Note (Signed)
 Chronic, BP above goal.  She didn't take toprol  XL last night, she has not been taking amlodipine  5mg . Will restart amlodipine  5mg  in am and continue toprol  XL 25mg  at night time.  RTC 3 mo HTN f/u visit.

## 2024-05-27 ENCOUNTER — Other Ambulatory Visit

## 2024-05-27 DIAGNOSIS — E538 Deficiency of other specified B group vitamins: Secondary | ICD-10-CM

## 2024-05-28 ENCOUNTER — Ambulatory Visit: Payer: Self-pay | Admitting: Family Medicine

## 2024-05-28 ENCOUNTER — Telehealth: Payer: Self-pay

## 2024-05-28 ENCOUNTER — Other Ambulatory Visit (HOSPITAL_COMMUNITY): Payer: Self-pay

## 2024-05-28 LAB — HEMOGLOBIN A1C
Est. average glucose Bld gHb Est-mCnc: 77 mg/dL
Hgb A1c MFr Bld: 4.3 % — ABNORMAL LOW (ref 4.8–5.6)

## 2024-05-28 NOTE — Telephone Encounter (Signed)
 Pharmacy Patient Advocate Encounter   Received notification from Onbase that prior authorization for Concerta  18 is required/requested.   Insurance verification completed.   The patient is insured through [facility]. Per test claim:   Per test claim:  AZSTARYS is preferred by the insurance.  If suggested medication is appropriate, Please send in a new RX and discontinue this one. If not, please advise as to why it's not appropriate so that we may request a Prior Authorization. Please note, some preferred medications may still require a PA.  If the suggested medications have not been trialed and there are no contraindications to their use, the PA will not be submitted, as it will not be approved.

## 2024-05-29 LAB — VITAMIN B1: Vitamin B1 (Thiamine): 7 nmol/L — ABNORMAL LOW (ref 8–30)

## 2024-05-29 NOTE — Telephone Encounter (Addendum)
 Azstarys is combination serdexmethylphenidate and dexmethylphenidate .  She has allergy  to Focalin  (dexmethylphenidate ) - hives, swelling - so she should avoid Azstarys.  She has tolerated plain methylphenidate  (Concerta ) well.  Thanks.

## 2024-06-01 ENCOUNTER — Other Ambulatory Visit (HOSPITAL_COMMUNITY): Payer: Self-pay

## 2024-06-01 NOTE — Telephone Encounter (Addendum)
 Please notify patient - her insurance no longer covers Concerta  (methylphenidate ), even with prior authorization.  She had hives/swelling to Focalin  (dexmethylphenidate ). Recommend she call her insurance to see what is recommended alternative to Concerta  (methylphenidate ).   Alternatively, is she willing to try Adderall?

## 2024-06-01 NOTE — Telephone Encounter (Signed)
 Clinical questions have been answered and PA submitted. PA currently Pending. Please be advised that most companies allow up to 30 days to make a decision. We will advise when a determination has been made, or follow up in 1 week.   Please reach out to our team, Rx Prior Auth Pool, if you haven't heard back in a week.

## 2024-06-02 MED ORDER — AMPHETAMINE-DEXTROAMPHET ER 10 MG PO CP24: 10.0000 mg | ORAL_CAPSULE | Freq: Every day | ORAL | 0 refills | Status: DC

## 2024-06-02 NOTE — Telephone Encounter (Signed)
 ERx Adderall XR 10mg  to replace Concerta  18mg  due to insurance formulary change

## 2024-06-02 NOTE — Telephone Encounter (Signed)
 Called patient she would like to try Adderall. Advised we will call if any further information is needed or if we have any to give.

## 2024-06-02 NOTE — Addendum Note (Signed)
 Addended by: RILLA BALLER on: 06/02/2024 04:56 PM   Modules accepted: Orders

## 2024-06-04 NOTE — Telephone Encounter (Signed)
 Called patient states that pharmacy told her that she needs prior auth on this medication. Please advise.

## 2024-06-05 ENCOUNTER — Telehealth: Payer: Self-pay

## 2024-06-05 ENCOUNTER — Other Ambulatory Visit (HOSPITAL_COMMUNITY): Payer: Self-pay

## 2024-06-05 NOTE — Telephone Encounter (Signed)
 Pharmacy Patient Advocate Encounter   Received notification from Pt Calls Messages that prior authorization for Adderall XR 10 caps is required/requested.   Insurance verification completed.   The patient is insured through [employer].   Per test claim: PA required; PA submitted to above mentioned insurance via Prompt PA Key/confirmation #/EOC [id number] Status is pending

## 2024-06-09 ENCOUNTER — Other Ambulatory Visit (HOSPITAL_COMMUNITY): Payer: Self-pay

## 2024-06-09 NOTE — Telephone Encounter (Signed)
 Pharmacy Patient Advocate Encounter  Received notification from [employer] that Prior Authorization for Adderall XR 10 caps has been APPROVED.  Patient copay $138.16

## 2024-06-19 ENCOUNTER — Encounter

## 2024-07-08 ENCOUNTER — Other Ambulatory Visit: Payer: Self-pay | Admitting: Family Medicine

## 2024-07-08 DIAGNOSIS — F418 Other specified anxiety disorders: Secondary | ICD-10-CM

## 2024-07-08 NOTE — Telephone Encounter (Signed)
 Copied from CRM [id number]. Topic: Clinical - Medication Refill >> Jul 08, 2024 12:04 PM Chasity T wrote: Medication:  amphetamine -dextroamphetamine (ADDERALL XR) 10 MG 24 hr capsule LORazepam  (ATIVAN ) 0.5 MG tablet  Has the patient contacted their pharmacy? Yes (Agent: If no, request that the patient contact the pharmacy for the refill. If patient does not wish to contact the pharmacy document the reason why and proceed with request.) (Agent: If yes, when and what did the pharmacy advise?)  This is the patient's preferred pharmacy:  [phone] [place] - [address] [place] [address] Phone: [phone] Fax: [phone]  Is this the correct pharmacy for this prescription? Yes If no, delete pharmacy and type the correct one.   Has the prescription been filled recently? No  Is the patient out of the medication? Yes  Has the patient been seen for an appointment in the last year OR does the patient have an upcoming appointment? Yes  Can we respond through MyChart? No  Agent: Please be advised that Rx refills may take up to 3 business days. We ask that you follow-up with your pharmacy.

## 2024-07-08 NOTE — Telephone Encounter (Signed)
 Last office visit 05/26/24 for CPE.  Last refilled adderall 06/02/2024 for #30 with no refills. Lorazepam  05/25/2024 for #30 with no refills.  Next Appt: 09/23/24 for 4 month follow up.

## 2024-07-11 MED ORDER — LORAZEPAM 0.5 MG PO TABS: ORAL_TABLET | ORAL | 0 refills | Status: AC

## 2024-07-11 MED ORDER — AMPHETAMINE-DEXTROAMPHET ER 10 MG PO CP24: 10.0000 mg | ORAL_CAPSULE | Freq: Every day | ORAL | 0 refills | Status: AC

## 2024-07-11 NOTE — Telephone Encounter (Signed)
 ERx

## 2024-07-14 ENCOUNTER — Telehealth: Payer: Self-pay | Admitting: Family Medicine

## 2024-07-14 NOTE — Telephone Encounter (Addendum)
 Spoke with patient. 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  Pending oncology evaluation at [facility] on Friday.  Also has [facility] onc eval Monday.

## 2024-07-14 NOTE — Telephone Encounter (Signed)
 Copied from CRM #[id number]. Topic: General - Other >> Jul 14, 2024  9:53 AM Theresa Pratt wrote: Reason for CRM: patient would like to reach out to her pcp in regards to her hospital visit 07/13/24. She went in for abdominal pain and she was informed that she has liver cancer. She would like to speak to Dr.G. Please follow up with her.[email or web address]

## 2024-07-17 ENCOUNTER — Ambulatory Visit: Admitting: Family Medicine

## 2024-07-20 ENCOUNTER — Telehealth: Payer: Self-pay

## 2024-07-20 NOTE — Telephone Encounter (Addendum)
 Called and spoke with Dr Romero.  Unknown primary planned tissue biopsy then referral to oncology  Will await biopsy results. SABRA

## 2024-07-20 NOTE — Telephone Encounter (Signed)
 Copied from CRM [id number]. Topic: General - Other >> Jul 20, 2024 10:58 AM China J wrote: Reason for CRM: The patient was seen by her oncologist Friday and the oncologist would like to touch base with Dr. Rilla.   Please call Dr. Toribio Hammersmith at [phone].

## 2024-07-21 ENCOUNTER — Ambulatory Visit
Admission: RE | Admit: 2024-07-21 | Discharge: 2024-07-21 | Disposition: A | Discharge status: Home / Self Care | Source: Ambulatory Visit | Attending: Family Medicine | Admitting: Family Medicine

## 2024-07-21 ENCOUNTER — Encounter: Payer: Self-pay | Admitting: Family Medicine

## 2024-07-21 ENCOUNTER — Ambulatory Visit (INDEPENDENT_AMBULATORY_CARE_PROVIDER_SITE_OTHER): Admitting: Family Medicine

## 2024-07-21 VITALS — BP 118/90 | HR 83 | Temp 97.8°F | Ht 65.0 in | Wt 235.8 lb

## 2024-07-21 DIAGNOSIS — R160 Hepatomegaly, not elsewhere classified: Secondary | ICD-10-CM | POA: Insufficient documentation

## 2024-07-21 DIAGNOSIS — I82890 Acute embolism and thrombosis of other specified veins: Secondary | ICD-10-CM | POA: Diagnosis not present

## 2024-07-21 DIAGNOSIS — I81 Portal vein thrombosis: Secondary | ICD-10-CM | POA: Insufficient documentation

## 2024-07-21 DIAGNOSIS — C787 Secondary malignant neoplasm of liver and intrahepatic bile duct: Secondary | ICD-10-CM | POA: Diagnosis not present

## 2024-07-21 NOTE — Assessment & Plan Note (Addendum)
 Reviewed [facility] ER visit and recent [facility] surgical oncology visits with patient - concerning imaging findings for liver metastasis of unknown primary, with thrombosis of portal and splenic veins. She is overall asymptomatic at this time.  She had transaminitis but tumor markers were normal.  UTI treated with augmentin  with improvement.  Plan is liver biopsy then likely [facility] medical oncology appointment She will let me know if she needs me to fill out FMLA forms. I will follow along closely.  Will update CXR today.  I did recommend she call to schedule screening mammogram as due.

## 2024-07-21 NOTE — Progress Notes (Signed)
 " Ph: [phone] Fax: [phone]   Patient ID: Theresa Pratt, female    DOB: August 31, 1970, 54 y.o.   MRN: [id number]  This visit was conducted in person.  BP (!) 118/90 (BP Location: Right Arm, Cuff Size: Large)   Pulse 83   Temp 97.8 F (36.6 C) (Oral)   Ht 5' 5 (1.651 m)   Wt 235 lb 12.8 oz (107 kg)   SpO2 99%   BMI 39.24 kg/m   BP Readings from Last 3 Encounters:  07/21/24 (!) 118/90  05/26/24 (!) 148/98  11/21/23 (!) 139/92   CC: 3 mo f/u visit  Subjective:   HPI: Theresa Pratt is a 54 y.o. female presenting on 07/21/2024 for Hospitalization Follow-up (HSP FU / abnormal mass found 07/13/24/Wants to talk about next steps and what to expect/Will call to get biopsy scheduled at [facility] today)   Recent ER visit to [facility] for epigastric pain with nausea. 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 New transaminitis with AST 107, ALT 146, ALP 151. CBC WNL, lipase 30, all tumor markers returned normal.   Urine culture grew >10k Klebsiella pneumonia, started on augmentin  course with improvement.   Saw [facility] surgical oncology Dr Toribio Hammersmith on 07/17/2024, I personally spoke with him yesterday - unknown primary, planned tissue biopsy as first step then referral to oncology - not deemed surgical candidate initially. Also recommend eliquis anticoagulation after initial biopsy completed (eliquis 10mg  bid x7d then 5mg  bid).   If cancer found, will need chest CT to complete staging.  Liver biopsy pending scheduling.   CT abd/pelvis with contrast 07/13/2024: FINDINGS: LOWER CHEST: Unremarkable. LIVER: There are ill-defined hypoattenuating lesions throughout the liver. For reference, and segment 8 measuring 1.9 cm (2:29). And additional reference lesion in segment 4A measures 1.8 cm (2:40). Ill-defined low low attenuation tracks from the hepatic hilum along the portal triads, more so  about the left hepatic lobe (2:44). BILIARY: The gallbladder is surgically absent. The common bile duct measures up to 1.1 cm. SPLEEN: Normal in size and contour. PANCREAS:  No focal lesions.  No ductal dilation. ADRENAL GLANDS: Normal appearance of the adrenal glands. KIDNEYS/URETERS: Symmetric renal enhancement.  No hydronephrosis.  No solid renal mass. No renal calcifications are present. URINARY BLADDER: The urinary bladder is underdistended, limiting evaluation. REPRODUCTIVE ORGANS: Anteverted uterus with several noncalcified uterine fibroids, the largest of which measures up to 4.8 cm (2:31). 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 No findings of bowel obstruction or acute inflammation. Normal appendix. PERITONEUM, RETROPERITONEUM AND MESENTERY: No free air.  No ascites.  No fluid collection. LYMPH NODES: Lymphadenopathy is noted within the upper abdomen, for example in the portacaval distribution measuring up to 1 cm in greatest short axis diameter (image 50 of series 2). VESSELS: There is truncation of the left portal vein and anterior segment of the right portal vein, most compatible with tumor thrombus. Additional nonocclusive thrombus is present in the splenic vein near the hilum (for example image 51 of series 2). Normal caliber aorta.   BONES and SOFT TISSUES: Multilevel degenerative changes of the spine, most prominent and severe at L5-S1. Straightening of physiologic lumbar lordosis. No focal soft tissue lesions.  Impression: Spectrum of findings suggestive of cholangiocarcinoma with hepatic metastatic tumor burden and portal vein tumor thrombus as above. Additional nonocclusive thrombus within the splenic vein. Follow-up MRI with and without contrast (MRCP) is advised.  Additional incidental ancillary findings, as above.      Relevant past medical, surgical, family and social history reviewed and updated as indicated. Interim medical history since our  last visit reviewed. Allergies and medications reviewed and updated. Outpatient Medications Prior to Visit  Medication Sig Dispense Refill   amLODipine  (NORVASC ) 5 MG tablet Take 1 tablet (5 mg total) by mouth daily. 90 tablet 3   amphetamine -dextroamphetamine (ADDERALL XR) 10 MG 24 hr capsule Take 1 capsule (10 mg total) by mouth daily. 30 capsule 0   apixaban (ELIQUIS) 5 MG TABS tablet Take 1 tablet (5 mg total) by mouth 2 (two) times daily.     ascorbic acid  (VITAMIN C) 500 MG tablet Take 1 tablet (500 mg total) by mouth daily.     Calcium  Magnesium  Zinc  333-133-5 MG TABS Take 1 tablet by mouth daily.     Cholecalciferol  (VITAMIN D3) 1.25 MG (50000 UT) CAPS Take 1 capsule (1.25 mg total) by mouth once a week. 12 capsule 3   cyanocobalamin  (VITAMIN B12) 1000 MCG/ML injection Inject 1 mL (1,000 mcg total) into the muscle every 30 (thirty) days. 3 mL 3   cycloSPORINE  (SANDIMMUNE ) 25 MG capsule Take 50 mg by mouth 2 (two) times daily.     EPINEPHrine  0.3 mg/0.3 mL IJ SOAJ injection      famotidine  (PEPCID ) 20 MG tablet Take 1 tablet (20 mg total) by mouth 2 (two) times daily. 180 tablet 1   ferrous sulfate (SLOW RELEASE IRON ) 160 (50 Fe) MG TBCR SR tablet Take 1 tablet (160 mg total) by mouth daily.     fexofenadine  (ALLEGRA  ALLERGY ) 180 MG tablet Take 2 tablets (360 mg total) by mouth in the morning and at bedtime.     hydrOXYzine  (ATARAX ) 10 MG tablet Take 1 tablet (10 mg total) by mouth daily as needed for itching (hives). 90 tablet 1   LORazepam  (ATIVAN ) 0.5 MG tablet TAKE 1 TABLET BY MOUTH TWICE A DAY AS NEEDED FOR ANXIETY 30 tablet 0   methylphenidate  (CONCERTA ) 18 MG PO CR tablet Take 1 tablet (18 mg total) by mouth daily. 30 tablet 0   metoprolol  succinate (TOPROL -XL) 25 MG 24 hr tablet Take 1 tablet (25 mg total) by mouth daily. TAKE WITH OR IMMEDIATELY FOLLOWING A MEAL. 90 tablet 3   montelukast  (SINGULAIR ) 10 MG tablet Take 1 tablet (10 mg total) by mouth at bedtime. 30 tablet 11    Multiple Vitamin (MULTIVITAMIN) tablet Take 1 tablet by mouth daily.     Syringe/Needle, Disp, (SYRINGE 3CC/25GX1) 25G X 1 3 ML MISC Use with B12 injection once monthly 1 each 6   thiamine  100 MG tablet Take 1 tablet (100 mg total) by mouth daily.     triamcinolone  cream (KENALOG ) 0.1 % Apply 1 Application topically 2 (two) times daily. 30 g 0   cycloSPORINE  modified (NEORAL ) 50 MG capsule Take 50 mg by mouth 2 (two) times daily. (Patient not taking: Reported on 07/21/2024)     XOLAIR 150 MG/ML prefilled syringe Inject 300 mg into the skin every 14 (fourteen) days. (Patient not taking: Reported on 07/21/2024)     No facility-administered medications prior to visit.     Per HPI unless specifically indicated in ROS section below Review of Systems  Objective:  BP (!) 118/90 (BP Location: Right Arm, Cuff Size: Large)   Pulse 83   Temp 97.8 F (36.6 C) (Oral)   Ht 5' 5 (1.651 m)   Wt 235 lb 12.8 oz (107 kg)  SpO2 99%   BMI 39.24 kg/m   Wt Readings from Last 3 Encounters:  07/21/24 235 lb 12.8 oz (107 kg)  05/26/24 240 lb 4 oz (109 kg)  11/21/23 241 lb (109.3 kg)      Physical Exam Vitals and nursing note reviewed.  Constitutional:      Appearance: Normal appearance. She is not ill-appearing.  HENT:     Head: Normocephalic and atraumatic.     Mouth/Throat:     Mouth: Mucous membranes are moist.     Pharynx: Oropharynx is clear. No oropharyngeal exudate or posterior oropharyngeal erythema.  Eyes:     Extraocular Movements: Extraocular movements intact.     Pupils: Pupils are equal, round, and reactive to light.  Neck:     Thyroid : No thyroid  mass, thyromegaly or thyroid  tenderness.  Cardiovascular:     Rate and Rhythm: Normal rate and regular rhythm.     Pulses: Normal pulses.     Heart sounds: Normal heart sounds. No murmur heard. Pulmonary:     Effort: Pulmonary effort is normal. No respiratory distress.     Breath sounds: Normal breath sounds. No wheezing, rhonchi or  rales.  Abdominal:     General: Bowel sounds are normal. There is no distension.     Palpations: Abdomen is soft. There is no mass.     Tenderness: There is no abdominal tenderness. There is no guarding or rebound.     Hernia: No hernia is present.  Musculoskeletal:     Cervical back: Normal range of motion and neck supple.     Right lower leg: No edema.     Left lower leg: No edema.  Skin:    General: Skin is warm and dry.     Findings: No rash.  Neurological:     Mental Status: She is alert.  Psychiatric:        Mood and Affect: Mood normal.        Behavior: Behavior normal.       Results for orders placed or performed in visit on 05/27/24  Hemoglobin A1c   Collection Time: 05/27/24  7:48 AM  Result Value Ref Range   Hgb A1c MFr Bld 4.3 (L) 4.8 - 5.6 %   Est. average glucose Bld gHb Est-mCnc 77 mg/dL    Assessment & Plan:   Problem List Items Addressed This Visit     Multiple metastatic lesions in liver [facility]) - Primary   Reviewed [facility] ER visit and recent [facility] surgical oncology visits with patient - concerning imaging findings for liver metastasis of unknown primary, with thrombosis of portal and splenic veins. She is overall asymptomatic at this time.  She had transaminitis but tumor markers were normal.  UTI treated with augmentin  with improvement.  Plan is liver biopsy then likely [facility] medical oncology appointment She will let me know if she needs me to fill out FMLA forms. I will follow along closely.  Will update CXR today.  I did recommend she call to schedule screening mammogram as due.       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 Plan is liver biopsy then anticoagulant commencement She already has elquis Rx through surgical oncology team at [facility].       Relevant Medications   apixaban (ELIQUIS) 5 MG TABS tablet   Other Visit Diagnoses       Splenic vein thrombosis  Relevant Medications   apixaban (ELIQUIS) 5 MG TABS tablet        No orders of the defined types were placed in this encounter.   Orders Placed This Encounter  Procedures   DG Chest 2 View    Standing Status:   Future    Number of Occurrences:   1    Expiration Date:   07/21/2025    Reason for Exam (SYMPTOM  OR DIAGNOSIS REQUIRED):   liver lesions with unknown primary    Is patient pregnant?:   No    Preferred imaging location?:   [facility]    Patient Instructions  Call to update mammogram at your convenience Chest xray today  Next step is liver biopsy - then pending results seeing the medical cancer doctor.   Follow up plan: No follow-ups on file.  Anton Blas, MD   "

## 2024-07-21 NOTE — Assessment & Plan Note (Signed)
 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 Plan is liver biopsy then anticoagulant commencement She already has elquis Rx through surgical oncology team at [facility].

## 2024-07-21 NOTE — Patient Instructions (Signed)
 Call to update mammogram at your convenience Chest xray today  Next step is liver biopsy - then pending results seeing the medical cancer doctor.

## 2024-07-29 ENCOUNTER — Other Ambulatory Visit: Payer: Self-pay | Admitting: Family Medicine

## 2024-07-29 ENCOUNTER — Ambulatory Visit
Admission: RE | Admit: 2024-07-29 | Discharge: 2024-07-29 | Disposition: A | Discharge status: Home / Self Care | Source: Ambulatory Visit | Attending: Family Medicine | Admitting: Family Medicine

## 2024-07-29 ENCOUNTER — Ambulatory Visit: Payer: Self-pay | Admitting: Family Medicine

## 2024-07-29 DIAGNOSIS — Z1231 Encounter for screening mammogram for malignant neoplasm of breast: Secondary | ICD-10-CM

## 2024-07-29 NOTE — Telephone Encounter (Signed)
 Unable to reach pt at this time, left voicemail instructing pt to call back for lab results.  Please provide message from provider/office when call is returned from patient.

## 2024-07-29 NOTE — Telephone Encounter (Unsigned)
 Copied from CRM [id number]. Topic: Clinical - Lab/Test Results >> Jul 29, 2024  1:35 PM Brittany M wrote: Reason for CRM: relayed message about xray to patient- she is wanting to know more information- please contact patient

## 2024-07-30 NOTE — Telephone Encounter (Signed)
 Called and informed pt of results. She had no further questions or concerns at this time. Sent email link to get started on mychart as well. Pt stated she will calll or mychart message us  with any questions/ concerns

## 2024-07-30 NOTE — H&P (Signed)
 " [facility] PRE-SEDATION RADIOLOGY ASSESSMENT SUBJECTIVE:  Theresa Pratt is a 54 y.o. year old female requested to undergo percutaneous liver biopsy under moderate sedation.  Patient prior anesthesia or moderate sedation: unknown  DNR Status: DNR: No    Suspended for procedure? Not applicable  ASA CLASS: ASA 3 - Patient with moderate systemic disease with functional limitations  ALLERGIES: Allergies  Allergen Reactions   Sulfa  (Sulfonamide Antibiotics) Itching and Swelling    MEDICATIONS: Current Outpatient Medications  Medication Sig Dispense Refill   amLODIPine  (NORVASC ) 5 MG tablet Take 5 mg by mouth once daily     apixaban (ELIQUIS) 5 mg tablet Take 1 tablet (5 mg total) by mouth every 12 (twelve) hours Take 2 tablets (10 mg total) by mouth every 12 hours for 7 days. Reduce dose to 1 tablet (5 mg total) by mouth every 12 hours until told otherwise. 180 tablet 1   ascorbic acid , vitamin C, (VITAMIN C) 500 MG tablet Take 500 mg by mouth once daily     calcium -magnesium -zinc  333-133-5 mg Tab Take 1 tablet by mouth once daily     cholecalciferol  (VITAMIN D3) 1,250 mcg (50,000 unit) capsule Take 50,000 Units by mouth every 7 (seven) days     cyanocobalamin  (VITAMIN B12) 1,000 mcg/mL injection Inject 1,000 mcg into the muscle monthly     cycloSPORINE  modified (GENGRAF ) 50 mg capsule Take 50 mg by mouth 2 (two) times daily     dextroamphetamine-amphetamine  (ADDERALL XR) 10 MG XR capsule Take 10 mg by mouth once daily     EPINEPHrine  (EPIPEN ) 0.3 mg/0.3 mL auto-injector Inject 0.3 mg into the muscle as needed for Anaphylaxis     fexofenadine  (ALLEGRA ) 180 MG tablet Take 360 mg by mouth once daily     hydrOXYzine  HCL (ATARAX ) 10 MG tablet Take 10 mg by mouth once daily     LORazepam  (ATIVAN ) 0.5 MG tablet Take 0.5 mg by mouth 2 (two) times daily as needed     metoprolol  SUCCinate (TOPROL -XL) 25 MG XL tablet Take 25 mg by mouth once daily     montelukast  (SINGULAIR ) 10 mg  tablet Take 10 mg by mouth once daily     multivitamin tablet Take 1 tablet by mouth once daily     syringe with needle 1 mL 25 gauge x 1 syringe Use 1 Syringe as directed (as needed)     thiamine  (VITAMIN B-1) 100 MG tablet Take 100 mg by mouth once daily     triamcinolone  0.1 % cream Apply 1 Application topically 2 (two) times daily     No current facility-administered medications for this encounter.     PROBLEM LIST: Problem List Items Addressed This Visit   None    PAST SURGICAL HISTORY: No past surgical history on file.  PHYSICAL EXAM:  I have reviewed a recent H&P and note new findings as per exam below.  Vitals: There were no vitals taken for this visit. There is no height or weight on file to calculate BMI. General appearance: alert, cooperative, pleasant, in no acute distress Heart: regular rate and rhythm Lungs: symmetric chest expansion Abdomen: nondistended  MALLAMPATI SCORE: Class I: Soft palate, uvula, fauces, pillars visible.  LABS:  No results for input(s): APTT, INR, PTT in the last 168 hours. No results for input(s): NA, K, CL, CO2, BUN, CREATININE, EGFR, GLUCOSE, CALCIUM , PHOS in the last 168 hours.  Invalid input(s): LABALBU  ASSESSMENT/PLAN:  Pulmonary, cardiac, abdominal, airway, and mental status appropriate for this procedure.  Moderate sedation  is appropriate for this patient at this time.       ------------------------------------------------------------------------------- Attestation signed by Ronalee Cole, MD at 07/30/2024 12:31 PM Attestation Statement:   This service was rendered under my overall direction and control, and I was immediately available via phone/pager or present on site.  COLE RONALEE, MD  ------------------------------------------------------------------------------- "

## 2024-07-31 ENCOUNTER — Other Ambulatory Visit: Payer: Self-pay | Admitting: Family Medicine

## 2024-07-31 DIAGNOSIS — K7689 Other specified diseases of liver: Secondary | ICD-10-CM

## 2024-07-31 DIAGNOSIS — K769 Liver disease, unspecified: Secondary | ICD-10-CM

## 2024-08-01 ENCOUNTER — Ambulatory Visit: Payer: Self-pay | Admitting: Family Medicine

## 2024-08-02 ENCOUNTER — Ambulatory Visit
Admission: RE | Admit: 2024-08-02 | Discharge: 2024-08-02 | Disposition: A | Discharge status: Home / Self Care | Source: Ambulatory Visit | Attending: Family Medicine | Admitting: Family Medicine

## 2024-08-02 DIAGNOSIS — K769 Liver disease, unspecified: Secondary | ICD-10-CM | POA: Diagnosis present

## 2024-08-02 DIAGNOSIS — K7689 Other specified diseases of liver: Secondary | ICD-10-CM | POA: Insufficient documentation

## 2024-08-02 MED ORDER — GADOBUTROL 1 MMOL/ML IV SOLN
10.0000 mL | Freq: Once | INTRAVENOUS | Status: AC | PRN
  Administered 2024-08-02: 10 mL via INTRAVENOUS

## 2024-08-03 ENCOUNTER — Telehealth: Payer: Self-pay

## 2024-08-03 MED ORDER — TRAMADOL HCL 50 MG PO TABS: 50.0000 mg | ORAL_TABLET | Freq: Three times a day (TID) | ORAL | 0 refills | Status: AC | PRN

## 2024-08-03 NOTE — Addendum Note (Signed)
 Addended by: RILLA BALLER on: 08/03/2024 06:24 PM   Modules accepted: Orders

## 2024-08-03 NOTE — Telephone Encounter (Signed)
 Spoke with patient. Episode of abd pain last night lasted 2 hours, took tylenol  without relief.  Denies dizziness with standing, bleeding, nausea/vomiting.  Will Rx tramadol  50mg  PRN breakthrough pain sent to pharmacy Red flags to seek ER care reviewed.

## 2024-08-03 NOTE — Telephone Encounter (Signed)
 Copied from CRM #[id number]. Topic: Clinical - Medication Question >> Aug 03, 2024 10:24 AM Robinson H wrote: Reason for CRM: Patient states she was advised if she needed medication for pain to let Dr. Rilla know, Tylenol  not helping recently had abdomen biopsy was on Thursday and having some pain and was advised by Dr. Rilla would send in something stronger. When pain attack comes it's an 8, now feels fine. No bloating and bloody stools, no chest pains, states gas pain in abdomen. Patient states she had an MRI yesterday and not sure if that contributed to the pain  Lasondra [phone]

## 2024-08-05 ENCOUNTER — Encounter: Payer: Self-pay | Admitting: Obstetrics and Gynecology

## 2024-08-05 ENCOUNTER — Ambulatory Visit (INDEPENDENT_AMBULATORY_CARE_PROVIDER_SITE_OTHER): Payer: Self-pay | Admitting: Obstetrics and Gynecology

## 2024-08-05 VITALS — BP 120/78 | HR 74 | Ht 65.0 in | Wt 238.2 lb

## 2024-08-05 DIAGNOSIS — D219 Benign neoplasm of connective and other soft tissue, unspecified: Secondary | ICD-10-CM

## 2024-08-05 NOTE — Progress Notes (Signed)
 Pt presents for Fibroids. Pt would like to have them removed. Pt has no other questions or concerns at this time.

## 2024-08-05 NOTE — Progress Notes (Signed)
" °  CC: fibroid discussion Subjective:    Patient ID: Theresa Pratt, female    DOB: 07/16/1971, 54 y.o.   MRN: [id number]  HPI 54 yo G2P2 seen for discussion of fibroids.  Pt has been evaluated for liver issues with CT and MRI.  During these evaluations 4-5 fibroids were noted.  Pt is postmenopausal for one year.  No menses noted and vasomotor symptoms are present. Pt does not report any mass effect symptoms.  Pt does note increased fatigue.   Review of Systems     Objective:   Physical Exam Vitals:   08/05/24 1617  BP: 120/78  Pulse: 74         Assessment & Plan:   1. Fibroids (Primary) Will check pelvic ultrasound  to get current number and size of fibroids. Due to being postmenopausal and suboptimal surgical candidate would pursue expectant management  versus possible UFE for treatment. Due to being postmenopausal, fibroids should stabilize or actually decrease in size. Pt may need second opinion regarding possible surgery  - US  PELVIC COMPLETE WITH TRANSVAGINAL; Future  I spent 30 minutes dedicated to the care of this patient including previsit review of records, face to face time with the patient discussing radiologic findings, fibroid etiology, treatment options and post visit testing.   Jerilynn DELENA Buddle, MD Faculty Attending, [facility] [employer]  "

## 2024-08-07 ENCOUNTER — Telehealth: Payer: Self-pay | Admitting: Family Medicine

## 2024-08-07 DIAGNOSIS — D219 Benign neoplasm of connective and other soft tissue, unspecified: Secondary | ICD-10-CM

## 2024-08-07 NOTE — Telephone Encounter (Signed)
 Copied from CRM #[id number]. Topic: Referral - Request for Referral >> Aug 07, 2024 12:40 PM Suzen RAMAN wrote: Did the patient discuss referral with their provider in the last year? Yes (If No - schedule appointment) (If Yes - send message)  Appointment offered? No  Type of order/referral and detailed reason for visit: UroGynecologist, Fibroids  Preference of office, provider, location: [facility] in [place],  Dr. Alfonso Quay, Urogynecology [phone]  If referral order, have you been seen by this specialty before? No (If Yes, this issue or another issue? When? Where?  Can we respond through MyChart? No; would prefer a phone call

## 2024-08-10 NOTE — Telephone Encounter (Signed)
 New referral placed.

## 2024-08-13 ENCOUNTER — Ambulatory Visit: Admission: RE | Admit: 2024-08-13 | Source: Ambulatory Visit

## 2024-08-21 ENCOUNTER — Ambulatory Visit: Admission: RE | Admit: 2024-08-21

## 2024-08-21 DIAGNOSIS — D219 Benign neoplasm of connective and other soft tissue, unspecified: Secondary | ICD-10-CM

## 2024-08-26 ENCOUNTER — Ambulatory Visit

## 2024-09-15 ENCOUNTER — Ambulatory Visit

## 2024-09-23 ENCOUNTER — Ambulatory Visit: Admitting: Family Medicine
